# Patient Record
Sex: Female | Born: 1955 | Race: White | Hispanic: No | Marital: Married | Smoking: Never smoker
Health system: Southern US, Community
[De-identification: ages and names within clinical notes are randomized; demographics above are authoritative.]

## PROBLEM LIST (undated history)

## (undated) DIAGNOSIS — F32A Depression, unspecified: Secondary | ICD-10-CM

## (undated) DIAGNOSIS — I82409 Acute embolism and thrombosis of unspecified deep veins of unspecified lower extremity: Secondary | ICD-10-CM

## (undated) DIAGNOSIS — I890 Lymphedema, not elsewhere classified: Secondary | ICD-10-CM

## (undated) DIAGNOSIS — I10 Essential (primary) hypertension: Secondary | ICD-10-CM

## (undated) DIAGNOSIS — M1990 Unspecified osteoarthritis, unspecified site: Secondary | ICD-10-CM

## (undated) DIAGNOSIS — E669 Obesity, unspecified: Secondary | ICD-10-CM

## (undated) HISTORY — DX: Obesity, unspecified: E66.9

## (undated) HISTORY — PX: BRAIN SURGERY: SHX531

## (undated) HISTORY — DX: Acute embolism and thrombosis of unspecified deep veins of unspecified lower extremity: I82.409

## (undated) HISTORY — DX: Essential (primary) hypertension: I10

## (undated) HISTORY — PX: BUNIONECTOMY: SHX129

---
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
## 1986-09-21 HISTORY — PX: DILATION AND CURETTAGE OF UTERUS: SHX78

## 1986-09-21 HISTORY — PX: OTHER SURGICAL HISTORY: SHX169

## 1998-10-24 ENCOUNTER — Ambulatory Visit (HOSPITAL_COMMUNITY): Admission: RE | Admit: 1998-10-24 | Discharge: 1998-10-24 | Payer: Self-pay | Admitting: Internal Medicine

## 1998-10-24 ENCOUNTER — Encounter: Payer: Self-pay | Admitting: Internal Medicine

## 1999-10-20 ENCOUNTER — Other Ambulatory Visit: Admission: RE | Admit: 1999-10-20 | Discharge: 1999-10-20 | Payer: Self-pay | Admitting: Internal Medicine

## 1999-10-22 ENCOUNTER — Encounter: Payer: Self-pay | Admitting: Internal Medicine

## 1999-10-22 ENCOUNTER — Encounter: Admission: RE | Admit: 1999-10-22 | Discharge: 1999-10-22 | Payer: Self-pay | Admitting: Internal Medicine

## 1999-11-05 ENCOUNTER — Encounter: Payer: Self-pay | Admitting: Internal Medicine

## 1999-11-05 ENCOUNTER — Encounter (INDEPENDENT_AMBULATORY_CARE_PROVIDER_SITE_OTHER): Payer: Self-pay | Admitting: Specialist

## 1999-11-05 ENCOUNTER — Ambulatory Visit (HOSPITAL_COMMUNITY): Admission: RE | Admit: 1999-11-05 | Discharge: 1999-11-05 | Payer: Self-pay | Admitting: Internal Medicine

## 1999-11-06 ENCOUNTER — Ambulatory Visit (HOSPITAL_COMMUNITY): Admission: RE | Admit: 1999-11-06 | Discharge: 1999-11-06 | Payer: Self-pay | Admitting: Internal Medicine

## 1999-11-06 ENCOUNTER — Encounter: Payer: Self-pay | Admitting: Internal Medicine

## 2000-01-12 ENCOUNTER — Inpatient Hospital Stay (HOSPITAL_COMMUNITY): Admission: EM | Admit: 2000-01-12 | Discharge: 2000-01-13 | Payer: Self-pay | Admitting: Emergency Medicine

## 2000-01-12 ENCOUNTER — Encounter: Payer: Self-pay | Admitting: Emergency Medicine

## 2000-01-13 ENCOUNTER — Encounter: Payer: Self-pay | Admitting: Interventional Cardiology

## 2000-11-01 ENCOUNTER — Other Ambulatory Visit: Admission: RE | Admit: 2000-11-01 | Discharge: 2000-11-01 | Payer: Self-pay | Admitting: Internal Medicine

## 2000-11-17 ENCOUNTER — Ambulatory Visit (HOSPITAL_COMMUNITY): Admission: RE | Admit: 2000-11-17 | Discharge: 2000-11-17 | Payer: Self-pay | Admitting: Internal Medicine

## 2000-11-17 ENCOUNTER — Encounter: Payer: Self-pay | Admitting: Internal Medicine

## 2001-11-23 ENCOUNTER — Encounter: Payer: Self-pay | Admitting: Internal Medicine

## 2001-11-23 ENCOUNTER — Ambulatory Visit (HOSPITAL_COMMUNITY): Admission: RE | Admit: 2001-11-23 | Discharge: 2001-11-23 | Payer: Self-pay | Admitting: Internal Medicine

## 2001-12-01 ENCOUNTER — Other Ambulatory Visit: Admission: RE | Admit: 2001-12-01 | Discharge: 2001-12-01 | Payer: Self-pay | Admitting: Internal Medicine

## 2001-12-20 ENCOUNTER — Encounter: Payer: Self-pay | Admitting: Internal Medicine

## 2001-12-20 ENCOUNTER — Encounter: Admission: RE | Admit: 2001-12-20 | Discharge: 2001-12-20 | Payer: Self-pay | Admitting: Internal Medicine

## 2002-02-09 ENCOUNTER — Observation Stay (HOSPITAL_COMMUNITY): Admission: RE | Admit: 2002-02-09 | Discharge: 2002-02-10 | Payer: Self-pay | Admitting: Surgery

## 2002-02-09 ENCOUNTER — Encounter (INDEPENDENT_AMBULATORY_CARE_PROVIDER_SITE_OTHER): Payer: Self-pay | Admitting: Specialist

## 2002-02-09 ENCOUNTER — Encounter: Payer: Self-pay | Admitting: Surgery

## 2002-09-21 HISTORY — PX: GALLBLADDER SURGERY: SHX652

## 2002-11-06 ENCOUNTER — Encounter: Payer: Self-pay | Admitting: Emergency Medicine

## 2002-11-06 ENCOUNTER — Emergency Department (HOSPITAL_COMMUNITY): Admission: EM | Admit: 2002-11-06 | Discharge: 2002-11-06 | Payer: Self-pay

## 2003-01-29 ENCOUNTER — Ambulatory Visit (HOSPITAL_COMMUNITY): Admission: RE | Admit: 2003-01-29 | Discharge: 2003-01-29 | Payer: Self-pay | Admitting: Internal Medicine

## 2003-01-29 ENCOUNTER — Encounter: Payer: Self-pay | Admitting: Internal Medicine

## 2003-07-02 ENCOUNTER — Other Ambulatory Visit: Admission: RE | Admit: 2003-07-02 | Discharge: 2003-07-02 | Payer: Self-pay | Admitting: Internal Medicine

## 2004-02-11 ENCOUNTER — Ambulatory Visit (HOSPITAL_COMMUNITY): Admission: RE | Admit: 2004-02-11 | Discharge: 2004-02-11 | Payer: Self-pay | Admitting: Internal Medicine

## 2005-02-10 ENCOUNTER — Other Ambulatory Visit: Admission: RE | Admit: 2005-02-10 | Discharge: 2005-02-10 | Payer: Self-pay | Admitting: Internal Medicine

## 2005-02-13 ENCOUNTER — Encounter: Admission: RE | Admit: 2005-02-13 | Discharge: 2005-02-13 | Payer: Self-pay | Admitting: Internal Medicine

## 2005-03-16 ENCOUNTER — Encounter (HOSPITAL_COMMUNITY): Admission: RE | Admit: 2005-03-16 | Discharge: 2005-06-14 | Payer: Self-pay | Admitting: Internal Medicine

## 2005-03-25 ENCOUNTER — Other Ambulatory Visit: Admission: RE | Admit: 2005-03-25 | Discharge: 2005-03-25 | Payer: Self-pay | Admitting: Interventional Radiology

## 2005-03-25 ENCOUNTER — Encounter (INDEPENDENT_AMBULATORY_CARE_PROVIDER_SITE_OTHER): Payer: Self-pay | Admitting: Specialist

## 2005-03-25 ENCOUNTER — Encounter: Admission: RE | Admit: 2005-03-25 | Discharge: 2005-03-25 | Payer: Self-pay | Admitting: Internal Medicine

## 2005-04-08 ENCOUNTER — Ambulatory Visit (HOSPITAL_COMMUNITY): Admission: RE | Admit: 2005-04-08 | Discharge: 2005-04-08 | Payer: Self-pay | Admitting: Internal Medicine

## 2006-02-11 ENCOUNTER — Encounter: Admission: RE | Admit: 2006-02-11 | Discharge: 2006-02-11 | Payer: Self-pay | Admitting: Surgery

## 2006-04-16 ENCOUNTER — Ambulatory Visit (HOSPITAL_COMMUNITY): Admission: RE | Admit: 2006-04-16 | Discharge: 2006-04-16 | Payer: Self-pay | Admitting: Internal Medicine

## 2006-06-01 ENCOUNTER — Ambulatory Visit (HOSPITAL_COMMUNITY): Admission: RE | Admit: 2006-06-01 | Discharge: 2006-06-01 | Payer: Self-pay | Admitting: Gastroenterology

## 2006-06-23 ENCOUNTER — Other Ambulatory Visit: Admission: RE | Admit: 2006-06-23 | Discharge: 2006-06-23 | Payer: Self-pay | Admitting: Internal Medicine

## 2007-08-30 ENCOUNTER — Ambulatory Visit (HOSPITAL_COMMUNITY): Admission: RE | Admit: 2007-08-30 | Discharge: 2007-08-30 | Payer: Self-pay | Admitting: Internal Medicine

## 2008-10-08 ENCOUNTER — Ambulatory Visit (HOSPITAL_COMMUNITY): Admission: RE | Admit: 2008-10-08 | Discharge: 2008-10-08 | Payer: Self-pay | Admitting: Obstetrics

## 2009-12-17 ENCOUNTER — Ambulatory Visit (HOSPITAL_COMMUNITY): Admission: RE | Admit: 2009-12-17 | Discharge: 2009-12-17 | Payer: Self-pay | Admitting: Obstetrics

## 2010-07-28 ENCOUNTER — Encounter: Admission: RE | Admit: 2010-07-28 | Discharge: 2010-07-28 | Payer: Self-pay | Admitting: Ophthalmology

## 2010-08-13 ENCOUNTER — Ambulatory Visit (HOSPITAL_COMMUNITY): Admission: RE | Admit: 2010-08-13 | Discharge: 2010-08-13 | Payer: Self-pay | Admitting: Neurology

## 2010-08-13 ENCOUNTER — Ambulatory Visit: Payer: Self-pay | Admitting: Cardiology

## 2010-08-13 ENCOUNTER — Encounter: Admission: RE | Admit: 2010-08-13 | Discharge: 2010-08-13 | Payer: Self-pay | Admitting: Neurology

## 2010-08-13 ENCOUNTER — Encounter: Payer: Self-pay | Admitting: Cardiovascular Disease

## 2010-08-13 ENCOUNTER — Encounter (INDEPENDENT_AMBULATORY_CARE_PROVIDER_SITE_OTHER): Payer: Self-pay | Admitting: Neurology

## 2010-08-13 ENCOUNTER — Ambulatory Visit: Payer: Self-pay

## 2010-08-18 ENCOUNTER — Telehealth (INDEPENDENT_AMBULATORY_CARE_PROVIDER_SITE_OTHER): Payer: Self-pay | Admitting: *Deleted

## 2010-10-23 ENCOUNTER — Ambulatory Visit: Payer: 59 | Attending: Neurosurgery | Admitting: Rehabilitative and Restorative Service Providers"

## 2010-10-23 DIAGNOSIS — IMO0001 Reserved for inherently not codable concepts without codable children: Secondary | ICD-10-CM | POA: Insufficient documentation

## 2010-10-23 DIAGNOSIS — R269 Unspecified abnormalities of gait and mobility: Secondary | ICD-10-CM | POA: Insufficient documentation

## 2010-10-23 NOTE — Progress Notes (Signed)
  pt signed ROI Echo faxed over to [facility] @ [phone] [facility]  August 18, 2010 10:37 AM

## 2010-10-28 ENCOUNTER — Encounter: Payer: Self-pay | Admitting: Rehabilitative and Restorative Service Providers"

## 2010-10-31 ENCOUNTER — Ambulatory Visit: Payer: 59 | Admitting: Rehabilitative and Restorative Service Providers"

## 2010-11-04 ENCOUNTER — Encounter: Payer: Self-pay | Admitting: Rehabilitative and Restorative Service Providers"

## 2010-11-07 ENCOUNTER — Encounter: Payer: Self-pay | Admitting: Rehabilitative and Restorative Service Providers"

## 2010-11-07 ENCOUNTER — Ambulatory Visit: Payer: 59 | Admitting: Rehabilitative and Restorative Service Providers"

## 2010-11-11 ENCOUNTER — Ambulatory Visit: Payer: 59 | Admitting: Rehabilitative and Restorative Service Providers"

## 2010-11-13 ENCOUNTER — Other Ambulatory Visit: Payer: Self-pay | Admitting: Neurosurgery

## 2010-11-13 DIAGNOSIS — D333 Benign neoplasm of cranial nerves: Secondary | ICD-10-CM

## 2010-11-14 ENCOUNTER — Ambulatory Visit: Payer: 59 | Admitting: Physical Therapy

## 2010-11-15 ENCOUNTER — Inpatient Hospital Stay (INDEPENDENT_AMBULATORY_CARE_PROVIDER_SITE_OTHER)
Admission: RE | Admit: 2010-11-15 | Discharge: 2010-11-15 | Disposition: A | Discharge status: Home / Self Care | Payer: 59 | Source: Ambulatory Visit | Attending: Family Medicine | Admitting: Family Medicine

## 2010-11-15 DIAGNOSIS — Z4802 Encounter for removal of sutures: Secondary | ICD-10-CM

## 2010-11-17 ENCOUNTER — Other Ambulatory Visit: Payer: 59

## 2010-11-18 ENCOUNTER — Ambulatory Visit: Payer: 59 | Admitting: Rehabilitative and Restorative Service Providers"

## 2010-11-19 ENCOUNTER — Encounter: Payer: 59 | Admitting: Rehabilitative and Restorative Service Providers"

## 2010-11-20 ENCOUNTER — Ambulatory Visit (HOSPITAL_COMMUNITY)
Admission: RE | Admit: 2010-11-20 | Discharge: 2010-11-20 | Disposition: A | Discharge status: Home / Self Care | Payer: 59 | Source: Ambulatory Visit | Attending: Neurosurgery | Admitting: Neurosurgery

## 2010-11-20 DIAGNOSIS — D333 Benign neoplasm of cranial nerves: Secondary | ICD-10-CM | POA: Insufficient documentation

## 2010-11-20 DIAGNOSIS — Z483 Aftercare following surgery for neoplasm: Secondary | ICD-10-CM | POA: Insufficient documentation

## 2010-11-20 MED ORDER — GADOBENATE DIMEGLUMINE 529 MG/ML IV SOLN
20.0000 mL | Freq: Once | INTRAVENOUS | Status: AC | PRN
  Administered 2010-11-20: 20 mL via INTRAVENOUS

## 2010-11-21 ENCOUNTER — Ambulatory Visit: Payer: 59 | Attending: Neurosurgery | Admitting: Physical Therapy

## 2010-11-21 DIAGNOSIS — IMO0001 Reserved for inherently not codable concepts without codable children: Secondary | ICD-10-CM | POA: Insufficient documentation

## 2010-11-21 DIAGNOSIS — R269 Unspecified abnormalities of gait and mobility: Secondary | ICD-10-CM | POA: Insufficient documentation

## 2010-11-26 ENCOUNTER — Ambulatory Visit: Payer: 59 | Admitting: Rehabilitative and Restorative Service Providers"

## 2010-11-26 ENCOUNTER — Ambulatory Visit: Payer: 59 | Admitting: Physical Therapy

## 2010-11-28 ENCOUNTER — Ambulatory Visit: Payer: 59 | Admitting: Rehabilitative and Restorative Service Providers"

## 2010-12-02 ENCOUNTER — Ambulatory Visit: Payer: 59 | Admitting: Rehabilitative and Restorative Service Providers"

## 2010-12-04 ENCOUNTER — Ambulatory Visit: Payer: 59 | Admitting: Rehabilitative and Restorative Service Providers"

## 2010-12-09 ENCOUNTER — Ambulatory Visit: Payer: 59 | Admitting: Rehabilitative and Restorative Service Providers"

## 2010-12-16 ENCOUNTER — Ambulatory Visit: Payer: 59 | Admitting: Rehabilitative and Restorative Service Providers"

## 2010-12-22 ENCOUNTER — Encounter: Payer: 59 | Admitting: Rehabilitative and Restorative Service Providers"

## 2010-12-29 ENCOUNTER — Encounter: Payer: 59 | Admitting: Rehabilitative and Restorative Service Providers"

## 2011-01-01 ENCOUNTER — Other Ambulatory Visit: Payer: Self-pay | Admitting: Surgery

## 2011-01-01 DIAGNOSIS — E049 Nontoxic goiter, unspecified: Secondary | ICD-10-CM

## 2011-01-12 ENCOUNTER — Ambulatory Visit
Admission: RE | Admit: 2011-01-12 | Discharge: 2011-01-12 | Disposition: A | Discharge status: Home / Self Care | Payer: 59 | Source: Ambulatory Visit | Attending: Surgery | Admitting: Surgery

## 2011-01-12 DIAGNOSIS — E049 Nontoxic goiter, unspecified: Secondary | ICD-10-CM

## 2011-01-13 ENCOUNTER — Encounter: Payer: 59 | Admitting: Rehabilitative and Restorative Service Providers"

## 2011-01-23 ENCOUNTER — Encounter: Payer: 59 | Admitting: Rehabilitative and Restorative Service Providers"

## 2011-02-06 ENCOUNTER — Encounter: Payer: 59 | Admitting: Rehabilitative and Restorative Service Providers"

## 2011-02-06 NOTE — Op Note (Signed)
[place]  Patient:    Melissa Morrow, Melissa Morrow Visit Number: [id number] MRN: [id number]          Service Type: OBV Location: 4W 0451 01 Attending Physician:  Andre Lefort Dictated by:   Sandria Bales. Ezzard Standing, M.D. Proc. Date: 02/09/02 Admit Date:  02/09/2002 Discharge Date: 02/10/2002   CC:         Julieanne Manson, M.D.   Operative Report  DATE OF BIRTH:  02-13-1956. CCS #61500.  PREOPERATIVE DIAGNOSES:  Chronic cholecystectomy, cholelithiasis.  POSTOPERATIVE DIAGNOSES:  Chronic cholecystitis, cholelithiasis.  PROCEDURE:  Laparoscopic cholecystectomy with intraoperative cholangiogram.  SURGEON:  Sandria Bales. Ezzard Standing, M.D.  FIRST ASSISTANT:  Jimmye Norman, M.D.  ANESTHESIA:  General endotracheal.  ESTIMATED BLOOD LOSS:  Minimal.  INDICATION FOR PROCEDURE:  Melissa Morrow is a 55 year old white female who has had symptomatic cholelithiasis, had an ultrasound that shows the gallbladder packed with stones and the common bile duct at the upper limits of normal. She now comes for attempted laparoscopic cholecystectomy.  DESCRIPTION OF PROCEDURE:  The patient was placed in a supine position, given a general endotracheal anesthetic supervised by Lucille Passy, M.D.  She had 1 g of Ancef at the initiation of the procedure.  She had her abdomen prepped with Betadine solution and sterilely draped.  An infraumbilical incision was made with sharp dissection and carried down into the abdominal cavity.  A 0 degree laparoscope was inserted through a 12 mm Hasson trocar. The Hasson trocar was secured with a 0 Vicryl suture.  Laparoscopic exploration revealed the right and left lobes of the liver were unremarkable. The stomach was unremarkable.  I could see part of the right colon and transverse colon that was unremarkable.  The remainder of the bowel was covered with omentum and looking down at her pelvis I did not try to visualize her uterus or ovaries,  but there was no mass or nodule coming out of the pelvis.  I then placed three additional trocars, a 12 mm Ethicon trocar in a subxiphoid location, a 5 mm Ethicon trocar right midsubcostal, and a 5 mm Ethibond trocar in the right lateral subcostal location.  The gallbladder was identified and grasped and rotated cephalad.  The gallbladder was actually in a fair amount of mesentery but the liver, which was somewhat fatty, tended to collapse around the cystic duct-gallbladder junction.  She had a thickened, white-looking gallbladder consistent with longstanding chronic cholecystitis. I dissected out and identified a posterior cystic artery, which was doubly endoclipped, an anterior cystic artery, which was doubly endoclipped.  I then identified the gallbladder-cystic duct junction.  There was a lot of scarring between the gallbladder, the cystic duct, and toward the common bile duct, but I thought I opened the triangle of Calot well and visualized where the gallbladder ended into the cystic duct.  I then placed a clip on the gallbladder side of the cystic duct.  I then tried to do an intraoperative cholangiogram using a cut-off Taut catheter.  The intraoperative cholangiogram was shot with a 14-gauge Jelco placed into the abdominal wall and a Taut catheter inserted through this.  However, upon inserting this into the cystic duct, I was unable to press any saline, and the patient had a clear impacted cystic duct stone, and I actually milked out two or three stones before putting in the Taut.  Then I was able to crush and irrigate out the remaining stone.  I did use a Reddick catheter to try  to use this much as you use a Fogarty catheter, but this really was not successful. Most successful was trying to crush the stone and then irrigate it with saline, where I got actually clear saline coming out and then bile refluxing back.  I then placed the Taut catheter into the duct and shot an  intraoperative cholangiogram using half-strength Hypaque solution.  It showed free flow of contrast down the common bile duct into the duodenum and refluxing back into the hepatic radicles.  There was no evidence of any obstruction or mass within the common bile duct.  This was felt to be a normal intraoperative cholangiogram.  She actually had a fairly long segment of cystic duct that was hard to tell from my dissection because she had some scarring right at her common bile duct and cystic duct.  I then triply clipped the cystic duct and divided it after the Taut catheter had been removed.  I then dissected the gallbladder from the gallbladder bed using primarily hook Bovie coagulation.  Prior to complete division of the gallbladder from the gallbladder bed, I revisualized the triangle of Calot and the gallbladder bed. There was no bleeding and no bile leak, and I divided the gallbladder and placed it in an Endocatch bag.  It was delivered through the umbilicus intact.  There were a few stone fragments from what I had milked back to the cystic duct I was able to irrigate and capture either with the pooper scooper or by suctioning these fragments out.  I then irrigated the abdomen out with a liter of saline.  The umbilical trocar was removed and was sewn with a 0 Vicryl suture.  The other trocars were removed under direct visualization.  There was no bleeding at those trocar sites.  The skin at each site was closed with a 5-0 Vicryl suture, painted with tincture of benzoin and steri-stripped and then sterilely dressed.  The patient tolerated the procedure well, was transported to the recovery room in good condition. Dictated by:   Sandria Bales. Ezzard Standing, M.D. Attending Physician:  Andre Lefort DD:  02/09/02 TD:  02/11/02 Job: [id number]

## 2011-02-06 NOTE — Op Note (Signed)
NAME:  Melissa Morrow, Melissa Morrow NO.:  [id number]   MEDICAL RECORD NO.:  [id number]          PATIENT TYPE:  AMB   LOCATION:  ENDO                         FACILITY:  [facility]   PHYSICIAN:  Bernette Redbird, M.D.   DATE OF BIRTH:  Aug 07, 1956   DATE OF PROCEDURE:  06/01/2006  DATE OF DISCHARGE:                                 OPERATIVE REPORT   PROCEDURE:  Colonoscopy.   INDICATIONS:  55 year old female for initial colon cancer screening exam.  No worrisome risk factors or symptoms.  She might see occasional minimal  rectal bleeding attributed to hemorrhoids.   FINDINGS:  Moderate sigmoid diverticulosis.   PROCEDURE:  The nature, purpose, and risks of the procedure have been  discussed with the patient through our open access program.  I reviewed the  risks and purpose of the procedure with her immediately prior to the exam  and she provided written consent.   Sedation for this procedure totaled fentanyl 75 mcg and Versed 7 mg IV  without arrhythmias or desaturation.  The Olympus adult adjustable tension  video colonoscope was advanced quite easily around the colon through a  slightly fixated sigmoid region to the terminal ileum which had normal  appearance, and pullback was then performed.  The quality of prep was  excellent and it was felt that all areas were well seen.   The only abnormality on this exam was some moderate sigmoid diverticulosis  with associated muscular thickening and slight fixation of the sigmoid  region.  No polyps, cancer, colitis, vascular malformations or diverticular  disease other than in the sigmoid region was observed.  Retroflexion in the  rectum and reinspection of the rectum were unremarkable.  No biopsies were  obtained.  The patient tolerated the procedure well and there no apparent  complications.   IMPRESSION:  1. Screening colonoscopy in a standard risk individual, without      worrisome findings (V76.51).  2. Moderate sigmoid  diverticulosis.   PLAN:  Repeat colonoscopy in ten years or sooner if desired by the patient.           ______________________________  Bernette Redbird, M.D.     RB/MEDQ  D:  06/01/2006  T:  06/01/2006  Job:  [id number]   cc:   [facility]

## 2011-03-11 ENCOUNTER — Encounter (INDEPENDENT_AMBULATORY_CARE_PROVIDER_SITE_OTHER): Payer: Self-pay | Admitting: Surgery

## 2011-03-11 DIAGNOSIS — I10 Essential (primary) hypertension: Secondary | ICD-10-CM | POA: Insufficient documentation

## 2011-03-11 DIAGNOSIS — Z803 Family history of malignant neoplasm of breast: Secondary | ICD-10-CM

## 2011-03-11 DIAGNOSIS — Z460 Encounter for fitting and adjustment of spectacles and contact lenses: Secondary | ICD-10-CM | POA: Insufficient documentation

## 2011-03-11 DIAGNOSIS — H9190 Unspecified hearing loss, unspecified ear: Secondary | ICD-10-CM | POA: Insufficient documentation

## 2011-03-11 DIAGNOSIS — Z780 Asymptomatic menopausal state: Secondary | ICD-10-CM | POA: Insufficient documentation

## 2011-04-03 ENCOUNTER — Other Ambulatory Visit (HOSPITAL_COMMUNITY): Payer: Self-pay | Admitting: Obstetrics

## 2011-04-03 DIAGNOSIS — Z1231 Encounter for screening mammogram for malignant neoplasm of breast: Secondary | ICD-10-CM

## 2011-04-15 ENCOUNTER — Ambulatory Visit (HOSPITAL_COMMUNITY)
Admission: RE | Admit: 2011-04-15 | Discharge: 2011-04-15 | Disposition: A | Discharge status: Home / Self Care | Payer: 59 | Source: Ambulatory Visit | Attending: Obstetrics | Admitting: Obstetrics

## 2011-04-15 DIAGNOSIS — Z1231 Encounter for screening mammogram for malignant neoplasm of breast: Secondary | ICD-10-CM

## 2011-05-26 ENCOUNTER — Other Ambulatory Visit: Payer: Self-pay | Admitting: Dermatology

## 2011-06-10 ENCOUNTER — Other Ambulatory Visit (HOSPITAL_COMMUNITY): Payer: Self-pay | Admitting: Otolaryngology

## 2011-06-10 DIAGNOSIS — D333 Benign neoplasm of cranial nerves: Secondary | ICD-10-CM

## 2011-06-12 ENCOUNTER — Ambulatory Visit (HOSPITAL_COMMUNITY)
Admission: RE | Admit: 2011-06-12 | Discharge: 2011-06-12 | Disposition: A | Discharge status: Home / Self Care | Payer: 59 | Source: Ambulatory Visit | Attending: Radiology | Admitting: Radiology

## 2011-06-12 DIAGNOSIS — R51 Headache: Secondary | ICD-10-CM | POA: Insufficient documentation

## 2011-06-12 LAB — CREATININE, SERUM
Creatinine, Ser: 0.61 mg/dL (ref 0.50–1.10)
GFR calc Af Amer: >60 mL/min (ref >60)

## 2011-06-15 ENCOUNTER — Ambulatory Visit (HOSPITAL_COMMUNITY)
Admission: RE | Admit: 2011-06-15 | Discharge: 2011-06-15 | Disposition: A | Discharge status: Home / Self Care | Payer: 59 | Source: Ambulatory Visit | Attending: Otolaryngology | Admitting: Otolaryngology

## 2011-06-15 DIAGNOSIS — D333 Benign neoplasm of cranial nerves: Secondary | ICD-10-CM | POA: Insufficient documentation

## 2011-06-15 MED ORDER — GADOBENATE DIMEGLUMINE 529 MG/ML IV SOLN
20.0000 mL | Freq: Once | INTRAVENOUS | Status: AC | PRN
  Administered 2011-06-15: 20 mL via INTRAVENOUS

## 2011-07-08 ENCOUNTER — Telehealth: Payer: Self-pay

## 2011-07-08 NOTE — Telephone Encounter (Signed)
Echo faxed to [facility] @ [facility] @ [phone]  07/08/11/km

## 2011-08-26 ENCOUNTER — Ambulatory Visit: Payer: 59 | Admitting: Rehabilitative and Restorative Service Providers"

## 2011-10-01 ENCOUNTER — Ambulatory Visit: Payer: 59 | Attending: Otolaryngology | Admitting: Rehabilitative and Restorative Service Providers"

## 2011-10-01 DIAGNOSIS — IMO0001 Reserved for inherently not codable concepts without codable children: Secondary | ICD-10-CM | POA: Insufficient documentation

## 2011-10-01 DIAGNOSIS — R269 Unspecified abnormalities of gait and mobility: Secondary | ICD-10-CM | POA: Insufficient documentation

## 2011-10-05 ENCOUNTER — Ambulatory Visit: Payer: 59 | Admitting: Rehabilitative and Restorative Service Providers"

## 2011-10-09 ENCOUNTER — Encounter: Payer: 59 | Admitting: Rehabilitative and Restorative Service Providers"

## 2011-10-13 ENCOUNTER — Ambulatory Visit: Payer: 59 | Admitting: Rehabilitative and Restorative Service Providers"

## 2011-10-16 ENCOUNTER — Ambulatory Visit: Payer: 59 | Admitting: Physical Therapy

## 2011-10-23 ENCOUNTER — Ambulatory Visit: Payer: 59 | Attending: Otolaryngology | Admitting: Rehabilitative and Restorative Service Providers"

## 2011-10-23 DIAGNOSIS — R269 Unspecified abnormalities of gait and mobility: Secondary | ICD-10-CM | POA: Insufficient documentation

## 2011-10-23 DIAGNOSIS — IMO0001 Reserved for inherently not codable concepts without codable children: Secondary | ICD-10-CM | POA: Insufficient documentation

## 2011-10-26 ENCOUNTER — Encounter: Payer: 59 | Admitting: Rehabilitative and Restorative Service Providers"

## 2011-11-03 ENCOUNTER — Encounter: Payer: 59 | Admitting: Rehabilitative and Restorative Service Providers"

## 2011-11-09 ENCOUNTER — Ambulatory Visit: Payer: 59 | Admitting: Rehabilitative and Restorative Service Providers"

## 2011-11-23 ENCOUNTER — Encounter: Payer: 59 | Admitting: Rehabilitative and Restorative Service Providers"

## 2011-12-07 ENCOUNTER — Encounter: Payer: 59 | Admitting: Rehabilitative and Restorative Service Providers"

## 2012-03-01 ENCOUNTER — Other Ambulatory Visit: Payer: Self-pay | Admitting: Dermatology

## 2012-03-03 ENCOUNTER — Other Ambulatory Visit: Payer: Self-pay | Admitting: Dermatology

## 2012-05-19 ENCOUNTER — Other Ambulatory Visit (HOSPITAL_COMMUNITY): Payer: Self-pay | Admitting: Obstetrics

## 2012-05-19 DIAGNOSIS — Z1231 Encounter for screening mammogram for malignant neoplasm of breast: Secondary | ICD-10-CM

## 2012-06-06 ENCOUNTER — Ambulatory Visit (HOSPITAL_COMMUNITY): Payer: 59

## 2012-06-10 ENCOUNTER — Ambulatory Visit (HOSPITAL_COMMUNITY): Payer: 59 | Attending: Obstetrics

## 2012-08-10 ENCOUNTER — Other Ambulatory Visit (HOSPITAL_COMMUNITY): Payer: Self-pay | Admitting: Otolaryngology

## 2012-08-10 DIAGNOSIS — D333 Benign neoplasm of cranial nerves: Secondary | ICD-10-CM

## 2012-08-22 ENCOUNTER — Ambulatory Visit (HOSPITAL_COMMUNITY)
Admission: RE | Admit: 2012-08-22 | Discharge: 2012-08-22 | Disposition: A | Discharge status: Home / Self Care | Payer: 59 | Source: Ambulatory Visit | Attending: Otolaryngology | Admitting: Otolaryngology

## 2012-08-22 DIAGNOSIS — D333 Benign neoplasm of cranial nerves: Secondary | ICD-10-CM | POA: Insufficient documentation

## 2012-08-22 MED ORDER — GADOBENATE DIMEGLUMINE 529 MG/ML IV SOLN
20.0000 mL | Freq: Once | INTRAVENOUS | Status: AC | PRN
  Administered 2012-08-22: 20 mL via INTRAVENOUS

## 2012-08-23 LAB — POCT I-STAT, CHEM 8
BUN: 13 mg/dL (ref 6–23)
Calcium, Ion: 1.22 mmol/L (ref 1.12–1.23)
Creatinine, Ser: 1 mg/dL (ref 0.50–1.10)
TCO2: 27 mmol/L (ref 0–100)

## 2012-10-18 ENCOUNTER — Ambulatory Visit (HOSPITAL_COMMUNITY): Payer: 59

## 2012-10-21 ENCOUNTER — Ambulatory Visit (HOSPITAL_COMMUNITY): Payer: 59

## 2012-10-24 ENCOUNTER — Ambulatory Visit (HOSPITAL_COMMUNITY)
Admission: RE | Admit: 2012-10-24 | Discharge: 2012-10-24 | Disposition: A | Discharge status: Home / Self Care | Payer: 59 | Source: Ambulatory Visit | Attending: Obstetrics | Admitting: Obstetrics

## 2012-10-24 DIAGNOSIS — Z1231 Encounter for screening mammogram for malignant neoplasm of breast: Secondary | ICD-10-CM | POA: Insufficient documentation

## 2012-11-24 ENCOUNTER — Other Ambulatory Visit: Payer: Self-pay | Admitting: Dermatology

## 2013-07-03 ENCOUNTER — Ambulatory Visit: Payer: 59 | Admitting: Family Medicine

## 2013-07-11 ENCOUNTER — Ambulatory Visit (INDEPENDENT_AMBULATORY_CARE_PROVIDER_SITE_OTHER): Payer: Self-pay | Admitting: Family Medicine

## 2013-07-11 DIAGNOSIS — Z713 Dietary counseling and surveillance: Secondary | ICD-10-CM

## 2013-10-27 ENCOUNTER — Other Ambulatory Visit (HOSPITAL_COMMUNITY): Payer: Self-pay | Admitting: Obstetrics

## 2013-10-27 DIAGNOSIS — Z1231 Encounter for screening mammogram for malignant neoplasm of breast: Secondary | ICD-10-CM

## 2013-11-03 ENCOUNTER — Ambulatory Visit (HOSPITAL_COMMUNITY): Payer: 59

## 2013-11-20 ENCOUNTER — Ambulatory Visit (HOSPITAL_COMMUNITY)
Admission: RE | Admit: 2013-11-20 | Discharge: 2013-11-20 | Disposition: A | Discharge status: Home / Self Care | Payer: 59 | Source: Ambulatory Visit | Attending: Obstetrics | Admitting: Obstetrics

## 2013-11-20 DIAGNOSIS — Z1231 Encounter for screening mammogram for malignant neoplasm of breast: Secondary | ICD-10-CM | POA: Insufficient documentation

## 2014-10-01 ENCOUNTER — Other Ambulatory Visit: Payer: Self-pay | Admitting: Neurosurgery

## 2014-10-01 ENCOUNTER — Other Ambulatory Visit (HOSPITAL_COMMUNITY): Payer: Self-pay | Admitting: Neurosurgery

## 2014-10-01 DIAGNOSIS — D333 Benign neoplasm of cranial nerves: Secondary | ICD-10-CM

## 2014-10-17 ENCOUNTER — Ambulatory Visit (HOSPITAL_COMMUNITY)
Admission: RE | Admit: 2014-10-17 | Discharge: 2014-10-17 | Disposition: A | Discharge status: Home / Self Care | Payer: 59 | Source: Ambulatory Visit | Attending: Neurosurgery | Admitting: Neurosurgery

## 2014-10-17 DIAGNOSIS — D333 Benign neoplasm of cranial nerves: Secondary | ICD-10-CM | POA: Insufficient documentation

## 2014-10-17 DIAGNOSIS — Z01818 Encounter for other preprocedural examination: Secondary | ICD-10-CM | POA: Diagnosis not present

## 2014-10-17 MED ORDER — GADOBENATE DIMEGLUMINE 529 MG/ML IV SOLN
20.0000 mL | Freq: Once | INTRAVENOUS | Status: AC | PRN
  Administered 2014-10-17: 20 mL via INTRAVENOUS

## 2014-12-26 ENCOUNTER — Other Ambulatory Visit (HOSPITAL_COMMUNITY): Payer: Self-pay | Admitting: Obstetrics

## 2014-12-26 DIAGNOSIS — Z1231 Encounter for screening mammogram for malignant neoplasm of breast: Secondary | ICD-10-CM

## 2015-01-17 ENCOUNTER — Ambulatory Visit (HOSPITAL_COMMUNITY): Payer: 59

## 2015-01-18 ENCOUNTER — Ambulatory Visit (HOSPITAL_COMMUNITY)
Admission: RE | Admit: 2015-01-18 | Discharge: 2015-01-18 | Disposition: A | Discharge status: Home / Self Care | Payer: 59 | Source: Ambulatory Visit | Attending: Obstetrics | Admitting: Obstetrics

## 2015-01-18 DIAGNOSIS — Z1231 Encounter for screening mammogram for malignant neoplasm of breast: Secondary | ICD-10-CM | POA: Diagnosis not present

## 2015-11-29 DIAGNOSIS — Z1211 Encounter for screening for malignant neoplasm of colon: Secondary | ICD-10-CM | POA: Diagnosis not present

## 2015-11-29 DIAGNOSIS — Z86018 Personal history of other benign neoplasm: Secondary | ICD-10-CM | POA: Diagnosis not present

## 2015-11-29 DIAGNOSIS — E042 Nontoxic multinodular goiter: Secondary | ICD-10-CM | POA: Diagnosis not present

## 2015-11-29 DIAGNOSIS — Z0000 Encounter for general adult medical examination without abnormal findings: Secondary | ICD-10-CM | POA: Diagnosis not present

## 2015-11-29 DIAGNOSIS — I10 Essential (primary) hypertension: Secondary | ICD-10-CM | POA: Diagnosis not present

## 2015-12-04 DIAGNOSIS — G4733 Obstructive sleep apnea (adult) (pediatric): Secondary | ICD-10-CM | POA: Diagnosis not present

## 2015-12-11 DIAGNOSIS — H9042 Sensorineural hearing loss, unilateral, left ear, with unrestricted hearing on the contralateral side: Secondary | ICD-10-CM | POA: Diagnosis not present

## 2015-12-16 MED FILL — LOSARTAN-HCTZ 50-12.5 MG TA: 50-12.5 | 90 days supply | Qty: 90 | Fill #0

## 2016-01-25 DIAGNOSIS — H9072 Mixed conductive and sensorineural hearing loss, unilateral, left ear, with unrestricted hearing on the contralateral side: Secondary | ICD-10-CM | POA: Diagnosis not present

## 2016-03-10 DIAGNOSIS — G4733 Obstructive sleep apnea (adult) (pediatric): Secondary | ICD-10-CM | POA: Diagnosis not present

## 2016-03-11 DIAGNOSIS — R635 Abnormal weight gain: Secondary | ICD-10-CM | POA: Diagnosis not present

## 2016-03-11 DIAGNOSIS — F54 Psychological and behavioral factors associated with disorders or diseases classified elsewhere: Secondary | ICD-10-CM | POA: Diagnosis not present

## 2016-03-11 DIAGNOSIS — F4329 Adjustment disorder with other symptoms: Secondary | ICD-10-CM | POA: Diagnosis not present

## 2016-03-11 DIAGNOSIS — E6609 Other obesity due to excess calories: Secondary | ICD-10-CM | POA: Diagnosis not present

## 2016-03-11 MED FILL — LOSARTAN-HCTZ 50-12.5 MG TA: 50-12.5 | 90 days supply | Qty: 90 | Fill #1

## 2016-03-19 DIAGNOSIS — R635 Abnormal weight gain: Secondary | ICD-10-CM | POA: Diagnosis not present

## 2016-03-19 DIAGNOSIS — Z6838 Body mass index (BMI) 38.0-38.9, adult: Secondary | ICD-10-CM | POA: Diagnosis not present

## 2016-03-19 DIAGNOSIS — I10 Essential (primary) hypertension: Secondary | ICD-10-CM | POA: Diagnosis not present

## 2016-03-19 DIAGNOSIS — Z9889 Other specified postprocedural states: Secondary | ICD-10-CM | POA: Diagnosis not present

## 2016-03-19 DIAGNOSIS — Z86018 Personal history of other benign neoplasm: Secondary | ICD-10-CM | POA: Diagnosis not present

## 2016-03-19 DIAGNOSIS — G4730 Sleep apnea, unspecified: Secondary | ICD-10-CM | POA: Diagnosis not present

## 2016-03-19 DIAGNOSIS — E669 Obesity, unspecified: Secondary | ICD-10-CM | POA: Diagnosis not present

## 2016-03-31 DIAGNOSIS — Z86018 Personal history of other benign neoplasm: Secondary | ICD-10-CM | POA: Diagnosis not present

## 2016-03-31 DIAGNOSIS — I10 Essential (primary) hypertension: Secondary | ICD-10-CM | POA: Diagnosis not present

## 2016-03-31 DIAGNOSIS — E669 Obesity, unspecified: Secondary | ICD-10-CM | POA: Diagnosis not present

## 2016-03-31 DIAGNOSIS — G4730 Sleep apnea, unspecified: Secondary | ICD-10-CM | POA: Diagnosis not present

## 2016-03-31 DIAGNOSIS — R635 Abnormal weight gain: Secondary | ICD-10-CM | POA: Diagnosis not present

## 2016-03-31 DIAGNOSIS — Z9889 Other specified postprocedural states: Secondary | ICD-10-CM | POA: Diagnosis not present

## 2016-03-31 DIAGNOSIS — Z6832 Body mass index (BMI) 32.0-32.9, adult: Secondary | ICD-10-CM | POA: Diagnosis not present

## 2016-04-15 DIAGNOSIS — G4733 Obstructive sleep apnea (adult) (pediatric): Secondary | ICD-10-CM | POA: Diagnosis not present

## 2016-05-05 DIAGNOSIS — R635 Abnormal weight gain: Secondary | ICD-10-CM | POA: Diagnosis not present

## 2016-05-05 DIAGNOSIS — Z9989 Dependence on other enabling machines and devices: Secondary | ICD-10-CM | POA: Diagnosis not present

## 2016-05-05 DIAGNOSIS — E669 Obesity, unspecified: Secondary | ICD-10-CM | POA: Diagnosis not present

## 2016-05-05 DIAGNOSIS — Z6837 Body mass index (BMI) 37.0-37.9, adult: Secondary | ICD-10-CM | POA: Diagnosis not present

## 2016-05-05 DIAGNOSIS — G4730 Sleep apnea, unspecified: Secondary | ICD-10-CM | POA: Diagnosis not present

## 2016-05-05 DIAGNOSIS — I10 Essential (primary) hypertension: Secondary | ICD-10-CM | POA: Diagnosis not present

## 2016-05-05 MED FILL — PHENDIMETRAZINE 35 MG TAB: 35 | 30 days supply | Qty: 30 | Fill #0

## 2016-05-18 MED FILL — CLOBETASOL 0.05% GEL: 0.05 | 10 days supply | Qty: 30 | Fill #0

## 2016-06-08 MED FILL — LOSARTAN-HCTZ 50-12.5 MG TA: 50-12.5 | 90 days supply | Qty: 90 | Fill #2

## 2016-06-09 DIAGNOSIS — E6609 Other obesity due to excess calories: Secondary | ICD-10-CM | POA: Diagnosis not present

## 2016-06-09 DIAGNOSIS — Z6836 Body mass index (BMI) 36.0-36.9, adult: Secondary | ICD-10-CM | POA: Diagnosis not present

## 2016-06-09 DIAGNOSIS — R635 Abnormal weight gain: Secondary | ICD-10-CM | POA: Diagnosis not present

## 2016-06-15 ENCOUNTER — Other Ambulatory Visit: Payer: Self-pay | Admitting: Obstetrics

## 2016-06-15 DIAGNOSIS — G4733 Obstructive sleep apnea (adult) (pediatric): Secondary | ICD-10-CM | POA: Diagnosis not present

## 2016-06-15 DIAGNOSIS — Z1231 Encounter for screening mammogram for malignant neoplasm of breast: Secondary | ICD-10-CM

## 2016-06-23 DIAGNOSIS — B372 Candidiasis of skin and nail: Secondary | ICD-10-CM | POA: Diagnosis not present

## 2016-06-23 MED FILL — FLUCONAZOLE 150 MG TABLET: 150 | 1 days supply | Qty: 1 | Fill #0

## 2016-06-23 MED FILL — NYSTATIN 100,000 UNITS/GM O: 100000 | 7 days supply | Qty: 30 | Fill #0

## 2016-06-24 ENCOUNTER — Ambulatory Visit
Admission: RE | Admit: 2016-06-24 | Discharge: 2016-06-24 | Disposition: A | Discharge status: Home / Self Care | Payer: 59 | Source: Ambulatory Visit | Attending: Obstetrics | Admitting: Obstetrics

## 2016-06-24 DIAGNOSIS — Z1231 Encounter for screening mammogram for malignant neoplasm of breast: Secondary | ICD-10-CM | POA: Diagnosis not present

## 2016-07-02 ENCOUNTER — Other Ambulatory Visit: Payer: 59

## 2016-07-02 ENCOUNTER — Other Ambulatory Visit: Payer: Self-pay | Admitting: Internal Medicine

## 2016-07-02 DIAGNOSIS — Z6836 Body mass index (BMI) 36.0-36.9, adult: Secondary | ICD-10-CM | POA: Diagnosis not present

## 2016-07-02 DIAGNOSIS — G4733 Obstructive sleep apnea (adult) (pediatric): Secondary | ICD-10-CM | POA: Diagnosis not present

## 2016-07-02 DIAGNOSIS — E042 Nontoxic multinodular goiter: Secondary | ICD-10-CM | POA: Diagnosis not present

## 2016-07-02 DIAGNOSIS — Z86018 Personal history of other benign neoplasm: Secondary | ICD-10-CM | POA: Diagnosis not present

## 2016-07-02 DIAGNOSIS — E6609 Other obesity due to excess calories: Secondary | ICD-10-CM | POA: Diagnosis not present

## 2016-07-02 DIAGNOSIS — I10 Essential (primary) hypertension: Secondary | ICD-10-CM | POA: Diagnosis not present

## 2016-07-03 MED FILL — GAVILYTE-N SOLUTION: 420 | 2 days supply | Qty: 4000 | Fill #0

## 2016-07-08 DIAGNOSIS — G4733 Obstructive sleep apnea (adult) (pediatric): Secondary | ICD-10-CM | POA: Diagnosis not present

## 2016-07-13 ENCOUNTER — Ambulatory Visit
Admission: RE | Admit: 2016-07-13 | Discharge: 2016-07-13 | Disposition: A | Discharge status: Home / Self Care | Payer: 59 | Source: Ambulatory Visit | Attending: Internal Medicine | Admitting: Internal Medicine

## 2016-07-13 DIAGNOSIS — E042 Nontoxic multinodular goiter: Secondary | ICD-10-CM | POA: Diagnosis not present

## 2016-07-17 DIAGNOSIS — Z01419 Encounter for gynecological examination (general) (routine) without abnormal findings: Secondary | ICD-10-CM | POA: Diagnosis not present

## 2016-07-17 DIAGNOSIS — Z6836 Body mass index (BMI) 36.0-36.9, adult: Secondary | ICD-10-CM | POA: Diagnosis not present

## 2016-07-24 DIAGNOSIS — H5213 Myopia, bilateral: Secondary | ICD-10-CM | POA: Diagnosis not present

## 2016-07-24 MED FILL — BEPREVE 1.5% EYE DROPS: 1.5 | 18 days supply | Qty: 5 | Fill #0

## 2016-07-27 DIAGNOSIS — Z1211 Encounter for screening for malignant neoplasm of colon: Secondary | ICD-10-CM | POA: Diagnosis not present

## 2016-07-27 DIAGNOSIS — K5730 Diverticulosis of large intestine without perforation or abscess without bleeding: Secondary | ICD-10-CM | POA: Diagnosis not present

## 2016-08-10 MED FILL — BEPREVE 1.5% EYE DROPS: 1.5 | 18 days supply | Qty: 5 | Fill #1

## 2016-09-10 DIAGNOSIS — J0120 Acute ethmoidal sinusitis, unspecified: Secondary | ICD-10-CM | POA: Diagnosis not present

## 2016-09-10 DIAGNOSIS — J209 Acute bronchitis, unspecified: Secondary | ICD-10-CM | POA: Diagnosis not present

## 2016-09-10 MED FILL — HYDROCODONE-CHLORPHENIRAM S: 10-8 | 7 days supply | Qty: 70 | Fill #0

## 2016-09-10 MED FILL — AZITHROMYCIN 250 MG TABLET: 250 | 5 days supply | Qty: 6 | Fill #0

## 2016-09-10 MED FILL — LOSARTAN-HCTZ 50-12.5 MG TA: 50-12.5 | 90 days supply | Qty: 90 | Fill #0

## 2016-09-17 DIAGNOSIS — G4733 Obstructive sleep apnea (adult) (pediatric): Secondary | ICD-10-CM | POA: Diagnosis not present

## 2016-09-20 ENCOUNTER — Emergency Department (HOSPITAL_COMMUNITY)
Admission: EM | Admit: 2016-09-20 | Discharge: 2016-09-20 | Disposition: A | Discharge status: Home / Self Care | Payer: 59 | Attending: Emergency Medicine | Admitting: Emergency Medicine

## 2016-09-20 ENCOUNTER — Emergency Department (HOSPITAL_COMMUNITY): Payer: 59

## 2016-09-20 DIAGNOSIS — J40 Bronchitis, not specified as acute or chronic: Secondary | ICD-10-CM

## 2016-09-20 DIAGNOSIS — R509 Fever, unspecified: Secondary | ICD-10-CM | POA: Diagnosis not present

## 2016-09-20 DIAGNOSIS — B9789 Other viral agents as the cause of diseases classified elsewhere: Secondary | ICD-10-CM

## 2016-09-20 DIAGNOSIS — Z79899 Other long term (current) drug therapy: Secondary | ICD-10-CM | POA: Insufficient documentation

## 2016-09-20 DIAGNOSIS — Z7982 Long term (current) use of aspirin: Secondary | ICD-10-CM | POA: Diagnosis not present

## 2016-09-20 DIAGNOSIS — R059 Cough, unspecified: Secondary | ICD-10-CM

## 2016-09-20 DIAGNOSIS — R05 Cough: Secondary | ICD-10-CM

## 2016-09-20 DIAGNOSIS — J069 Acute upper respiratory infection, unspecified: Secondary | ICD-10-CM | POA: Diagnosis not present

## 2016-09-20 DIAGNOSIS — I10 Essential (primary) hypertension: Secondary | ICD-10-CM | POA: Insufficient documentation

## 2016-09-20 MED ORDER — AEROCHAMBER PLUS FLO-VU MEDIUM MISC: 1.0000 | Freq: Once | Status: DC

## 2016-09-20 MED ORDER — ALBUTEROL SULFATE (2.5 MG/3ML) 0.083% IN NEBU
5.0000 mg | INHALATION_SOLUTION | Freq: Once | RESPIRATORY_TRACT | Status: AC
  Administered 2016-09-20: 5 mg via RESPIRATORY_TRACT
  Filled 2016-09-20: qty 6

## 2016-09-20 MED ORDER — ALBUTEROL SULFATE HFA 108 (90 BASE) MCG/ACT IN AERS
2.0000 | INHALATION_SPRAY | RESPIRATORY_TRACT | Status: DC | PRN
  Administered 2016-09-20: 2 via RESPIRATORY_TRACT
  Filled 2016-09-20: qty 6.7

## 2016-09-20 MED ORDER — GUAIFENESIN ER 1200 MG PO TB12: 1.0000 | ORAL_TABLET | Freq: Two times a day (BID) | ORAL | 0 refills | Status: DC

## 2016-09-20 MED ORDER — PROMETHAZINE-DM 6.25-15 MG/5ML PO SYRP: 5.0000 mL | ORAL_SOLUTION | Freq: Four times a day (QID) | ORAL | 0 refills | Status: DC | PRN

## 2016-09-20 MED ORDER — DEXAMETHASONE SODIUM PHOSPHATE 10 MG/ML IJ SOLN
10.0000 mg | Freq: Once | INTRAMUSCULAR | Status: AC
  Administered 2016-09-20: 10 mg via INTRAMUSCULAR
  Filled 2016-09-20: qty 1

## 2016-09-20 MED ORDER — HYDROCOD POLST-CPM POLST ER 10-8 MG/5ML PO SUER
5.0000 mL | Freq: Once | ORAL | Status: AC
  Administered 2016-09-20: 5 mL via ORAL
  Filled 2016-09-20: qty 5

## 2016-09-20 MED ORDER — PREDNISONE 50 MG PO TABS: 50.0000 mg | ORAL_TABLET | Freq: Every day | ORAL | 0 refills | Status: DC

## 2016-09-20 MED ORDER — IPRATROPIUM BROMIDE 0.02 % IN SOLN
0.5000 mg | Freq: Once | RESPIRATORY_TRACT | Status: AC
  Administered 2016-09-20: 0.5 mg via RESPIRATORY_TRACT
  Filled 2016-09-20: qty 2.5

## 2016-09-20 NOTE — Discharge Instructions (Signed)
Return here for any worsening in your condition.  Follow-up with your primary care Dr. increase your fluid intake and rest as much as possible

## 2016-09-20 NOTE — ED Notes (Signed)
Pt to xray

## 2016-09-20 NOTE — ED Notes (Signed)
ED Provider at bedside. 

## 2016-09-20 NOTE — ED Provider Notes (Signed)
[place] DEPT Provider Note   CSN: [id number] Arrival date & time: 09/20/16  2033     History   Chief Complaint Chief Complaint  Patient presents with  . Cough    HPI Melissa Morrow is a 60 y.o. female.  HPI Patient presents to the emergency department with cough that has been persistent over the last few weeks.  Patient states she had a recent upper respiratory infection and was seen by her doctor and given Zithromax and Tussionex.  She states that her symptoms seemed to get mostly better, but did not completely resolve states that over the last few days her symptoms have gotten worse.  She states that the cough continues.  Patient states that she has had episodes tonight where she has had coughing fits that it lasted for several minutes and it causes her to feel like she is going to vomit. The patient denies chest pain, shortness of breath, headache,blurred vision, neck pain, fever, cough, weakness, numbness, dizziness, anorexia, edema, abdominal pain, nausea, vomiting, diarrhea, rash, back pain, dysuria, hematemesis, bloody stool, near syncope, or syncope. Past Medical History:  Diagnosis Date  . DVT (deep venous thrombosis)    Confirm location with patient.  . Hypertension    Mild  . Obesity     Patient Active Problem List   Diagnosis Date Noted  . High blood pressure 03/11/2011  . Hearing loss 03/11/2011  . Contact lens/glasses fitting 03/11/2011  . Menopause 03/11/2011  . Family history of breast cancer 03/11/2011    Past Surgical History:  Procedure Laterality Date  . APPENDECTOMY  1971  . 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 BUNIONECTOMY  [id number] and 1996  . [place]  . GALLBLADDER SURGERY  2004  . Placement of Greenfield Filter  1988    OB History    No data available       Home Medications    Prior to Admission medications   Medication Sig Start Date End Date Taking? Authorizing  Provider  aspirin 81 MG tablet Take 81 mg by mouth daily.      Historical Provider, MD  calcium carbonate (TUMS - DOSED IN MG ELEMENTAL CALCIUM) 500 MG chewable tablet Chew 1 tablet by mouth daily. Verify type with patient. Only noted "calcium" on history form dated 01/21/11.     Historical Provider, MD  lisinopril-hydrochlorothiazide (PRINZIDE,ZESTORETIC) 10-12.5 MG per tablet Take 1 tablet by mouth daily.      Historical Provider, MD  Multiple Vitamin (MULTIVITAMIN) tablet Take 1 tablet by mouth daily.      Historical Provider, MD    Family History Family History  Problem Relation Age of Onset  . Heart disease Father     Acute MI    Social History Social History  Substance Use Topics  . Smoking status: Never Smoker  . Smokeless tobacco: Not on file  . Alcohol use Yes     Comment: 1-2 glasses of wine per week.     Allergies   Gadolinium derivatives and Ace inhibitors   Review of Systems Review of Systems All other systems negative except as documented in the HPI. All pertinent positives and negatives as reviewed in the HPI.  Physical Exam Updated Vital Signs BP 150/73 (BP Location: Right Arm)   Pulse 104   Temp 98.7 F (37.1 C) (Oral)   Resp 20   SpO2 93%   Physical Exam  Constitutional: She is  oriented to person, place, and time. She appears well-developed and well-nourished. No distress.  HENT:  Head: Normocephalic and atraumatic.  Mouth/Throat: Oropharynx is clear and moist.  Eyes: Pupils are equal, round, and reactive to light.  Neck: Normal range of motion. Neck supple.  Cardiovascular: Normal rate, regular rhythm and normal heart sounds.  Exam reveals no gallop and no friction rub.   No murmur heard. Pulmonary/Chest: Effort normal and breath sounds normal. No respiratory distress. She has no wheezes.  Musculoskeletal: Normal range of motion. She exhibits no edema.  Neurological: She is alert and oriented to person, place, and time. She exhibits normal muscle  tone. Coordination normal.  Skin: Skin is warm and dry. No rash noted. No erythema.  Psychiatric: She has a normal mood and affect. Her behavior is normal.  Nursing note and vitals reviewed.    ED Treatments / Results  Labs (all labs ordered are listed, but only abnormal results are displayed) Labs Reviewed - No data to display  EKG  EKG Interpretation None       Radiology Dg Chest 2 View  Result Date: 09/20/2016 CLINICAL DATA:  Cough and congestion for 3 weeks.  Fever today. EXAM: CHEST  2 VIEW COMPARISON:  None. FINDINGS: The heart size and mediastinal contours are within normal limits. Both lungs are clear. The visualized skeletal structures are unremarkable. IMPRESSION: No active cardiopulmonary disease. Electronically Signed   By: Andreas Newport M.D.   On: 09/20/2016 21:30    Procedures Procedures (including critical care time)  Medications Ordered in ED Medications  dexamethasone (DECADRON) injection 10 mg (10 mg Intramuscular Given 09/20/16 2126)  albuterol (PROVENTIL) (2.5 MG/3ML) 0.083% nebulizer solution 5 mg (5 mg Nebulization Given 09/20/16 2126)  ipratropium (ATROVENT) nebulizer solution 0.5 mg (0.5 mg Nebulization Given 09/20/16 2126)  chlorpheniramine-HYDROcodone (TUSSIONEX) 10-8 MG/5ML suspension 5 mL (5 mLs Oral Given 09/20/16 2117)     Initial Impression / Assessment and Plan / ED Course  I have reviewed the triage vital signs and the nursing notes.  Pertinent labs & imaging results that were available during my care of the patient were reviewed by me and considered in my medical decision making (see chart for details).  Clinical Course     Patient most likely still has continued upper respiratory illness with the residual symptoms, still affecting her.  Patient will be given albuterol inhaler along with prednisone and cough suppressant.  She is advised to follow-up with her primary care Dr. told to return here as needed.  She is given an nebulizer  treatment here in the emergency department along with Tussionex.  Patient agrees the plan and all questions were answered  Final Clinical Impressions(s) / ED Diagnoses   Final diagnoses:  None    New Prescriptions New Prescriptions   No medications on file     Dalia Heading, PA-C 09/20/16 2159    Drenda Freeze, MD 09/21/16 [phone]

## 2016-09-20 NOTE — ED Triage Notes (Signed)
Pt states that she was treated recently for a sinus infection and cough that went away but has come back stronger and she has had these coughing fits causing her to feel like she can't catch her breath. Alert and oriented.

## 2016-09-22 DIAGNOSIS — I10 Essential (primary) hypertension: Secondary | ICD-10-CM | POA: Diagnosis not present

## 2016-09-22 DIAGNOSIS — E042 Nontoxic multinodular goiter: Secondary | ICD-10-CM | POA: Diagnosis not present

## 2016-09-22 DIAGNOSIS — G4733 Obstructive sleep apnea (adult) (pediatric): Secondary | ICD-10-CM | POA: Diagnosis not present

## 2016-09-22 DIAGNOSIS — J209 Acute bronchitis, unspecified: Secondary | ICD-10-CM | POA: Diagnosis not present

## 2016-09-22 DIAGNOSIS — J4521 Mild intermittent asthma with (acute) exacerbation: Secondary | ICD-10-CM | POA: Diagnosis not present

## 2016-09-22 DIAGNOSIS — K219 Gastro-esophageal reflux disease without esophagitis: Secondary | ICD-10-CM | POA: Diagnosis not present

## 2016-09-22 MED FILL — ALBUTEROL 0.083% INHAL SOLN: (2.5 MG/3ML | 7 days supply | Qty: 90 | Fill #0

## 2016-09-29 DIAGNOSIS — Z9989 Dependence on other enabling machines and devices: Secondary | ICD-10-CM | POA: Diagnosis not present

## 2016-09-29 DIAGNOSIS — I10 Essential (primary) hypertension: Secondary | ICD-10-CM | POA: Diagnosis not present

## 2016-09-29 DIAGNOSIS — E669 Obesity, unspecified: Secondary | ICD-10-CM | POA: Diagnosis not present

## 2016-09-29 DIAGNOSIS — G4730 Sleep apnea, unspecified: Secondary | ICD-10-CM | POA: Diagnosis not present

## 2016-09-29 DIAGNOSIS — R635 Abnormal weight gain: Secondary | ICD-10-CM | POA: Diagnosis not present

## 2016-09-29 DIAGNOSIS — Z6835 Body mass index (BMI) 35.0-35.9, adult: Secondary | ICD-10-CM | POA: Diagnosis not present

## 2016-10-06 DIAGNOSIS — J4521 Mild intermittent asthma with (acute) exacerbation: Secondary | ICD-10-CM | POA: Diagnosis not present

## 2016-10-06 DIAGNOSIS — Z6836 Body mass index (BMI) 36.0-36.9, adult: Secondary | ICD-10-CM | POA: Diagnosis not present

## 2016-10-06 DIAGNOSIS — I10 Essential (primary) hypertension: Secondary | ICD-10-CM | POA: Diagnosis not present

## 2016-10-19 MED FILL — PHENDIMETRAZINE 35 MG TAB: 35 | 30 days supply | Qty: 30 | Fill #1

## 2016-11-02 DIAGNOSIS — Z6835 Body mass index (BMI) 35.0-35.9, adult: Secondary | ICD-10-CM | POA: Diagnosis not present

## 2016-11-02 DIAGNOSIS — Z9889 Other specified postprocedural states: Secondary | ICD-10-CM | POA: Diagnosis not present

## 2016-11-02 DIAGNOSIS — R635 Abnormal weight gain: Secondary | ICD-10-CM | POA: Diagnosis not present

## 2016-11-02 DIAGNOSIS — G4730 Sleep apnea, unspecified: Secondary | ICD-10-CM | POA: Diagnosis not present

## 2016-11-02 DIAGNOSIS — E669 Obesity, unspecified: Secondary | ICD-10-CM | POA: Diagnosis not present

## 2016-11-02 DIAGNOSIS — I10 Essential (primary) hypertension: Secondary | ICD-10-CM | POA: Diagnosis not present

## 2016-11-02 DIAGNOSIS — Z86018 Personal history of other benign neoplasm: Secondary | ICD-10-CM | POA: Diagnosis not present

## 2016-11-02 DIAGNOSIS — Z9989 Dependence on other enabling machines and devices: Secondary | ICD-10-CM | POA: Diagnosis not present

## 2016-11-12 DIAGNOSIS — E669 Obesity, unspecified: Secondary | ICD-10-CM | POA: Diagnosis not present

## 2016-11-12 DIAGNOSIS — Z713 Dietary counseling and surveillance: Secondary | ICD-10-CM | POA: Diagnosis not present

## 2016-11-30 MED FILL — LOSARTAN-HCTZ 50-12.5 MG TA: 50-12.5 | 90 days supply | Qty: 90 | Fill #1

## 2016-12-17 DIAGNOSIS — G4733 Obstructive sleep apnea (adult) (pediatric): Secondary | ICD-10-CM | POA: Diagnosis not present

## 2016-12-24 DIAGNOSIS — Z6834 Body mass index (BMI) 34.0-34.9, adult: Secondary | ICD-10-CM | POA: Diagnosis not present

## 2016-12-24 DIAGNOSIS — E669 Obesity, unspecified: Secondary | ICD-10-CM | POA: Diagnosis not present

## 2016-12-24 DIAGNOSIS — Z713 Dietary counseling and surveillance: Secondary | ICD-10-CM | POA: Diagnosis not present

## 2016-12-29 DIAGNOSIS — E669 Obesity, unspecified: Secondary | ICD-10-CM | POA: Diagnosis not present

## 2016-12-29 DIAGNOSIS — Z86018 Personal history of other benign neoplasm: Secondary | ICD-10-CM | POA: Diagnosis not present

## 2016-12-29 DIAGNOSIS — I10 Essential (primary) hypertension: Secondary | ICD-10-CM | POA: Diagnosis not present

## 2016-12-29 DIAGNOSIS — Z6835 Body mass index (BMI) 35.0-35.9, adult: Secondary | ICD-10-CM | POA: Diagnosis not present

## 2016-12-29 DIAGNOSIS — G4730 Sleep apnea, unspecified: Secondary | ICD-10-CM | POA: Diagnosis not present

## 2016-12-29 DIAGNOSIS — Z9989 Dependence on other enabling machines and devices: Secondary | ICD-10-CM | POA: Diagnosis not present

## 2016-12-29 DIAGNOSIS — Z713 Dietary counseling and surveillance: Secondary | ICD-10-CM | POA: Diagnosis not present

## 2017-02-01 DIAGNOSIS — G4733 Obstructive sleep apnea (adult) (pediatric): Secondary | ICD-10-CM | POA: Diagnosis not present

## 2017-02-08 DIAGNOSIS — G4730 Sleep apnea, unspecified: Secondary | ICD-10-CM | POA: Diagnosis not present

## 2017-02-08 DIAGNOSIS — Z86018 Personal history of other benign neoplasm: Secondary | ICD-10-CM | POA: Diagnosis not present

## 2017-02-08 DIAGNOSIS — I10 Essential (primary) hypertension: Secondary | ICD-10-CM | POA: Diagnosis not present

## 2017-02-08 DIAGNOSIS — E669 Obesity, unspecified: Secondary | ICD-10-CM | POA: Diagnosis not present

## 2017-02-08 DIAGNOSIS — Z6836 Body mass index (BMI) 36.0-36.9, adult: Secondary | ICD-10-CM | POA: Diagnosis not present

## 2017-02-08 DIAGNOSIS — Z713 Dietary counseling and surveillance: Secondary | ICD-10-CM | POA: Diagnosis not present

## 2017-02-08 DIAGNOSIS — Z9889 Other specified postprocedural states: Secondary | ICD-10-CM | POA: Diagnosis not present

## 2017-02-12 MED FILL — BEPREVE 1.5% EYE DROPS: 1.5 | 25 days supply | Qty: 5 | Fill #2

## 2017-03-08 MED FILL — LOSARTAN POTASSIUM-HCTZ 50-: 50-12.5 | 90 days supply | Qty: 90 | Fill #2

## 2017-04-05 DIAGNOSIS — Z566 Other physical and mental strain related to work: Secondary | ICD-10-CM | POA: Diagnosis not present

## 2017-04-05 DIAGNOSIS — I10 Essential (primary) hypertension: Secondary | ICD-10-CM | POA: Diagnosis not present

## 2017-04-05 DIAGNOSIS — Z713 Dietary counseling and surveillance: Secondary | ICD-10-CM | POA: Diagnosis not present

## 2017-04-05 DIAGNOSIS — Z6836 Body mass index (BMI) 36.0-36.9, adult: Secondary | ICD-10-CM | POA: Diagnosis not present

## 2017-04-05 DIAGNOSIS — E669 Obesity, unspecified: Secondary | ICD-10-CM | POA: Diagnosis not present

## 2017-04-05 DIAGNOSIS — G4730 Sleep apnea, unspecified: Secondary | ICD-10-CM | POA: Diagnosis not present

## 2017-04-05 DIAGNOSIS — Z9989 Dependence on other enabling machines and devices: Secondary | ICD-10-CM | POA: Diagnosis not present

## 2017-04-05 DIAGNOSIS — F320 Major depressive disorder, single episode, mild: Secondary | ICD-10-CM | POA: Diagnosis not present

## 2017-04-05 MED FILL — SERTRALINE HCL 50 MG TABLET: 50 | 90 days supply | Qty: 90 | Fill #0

## 2017-04-14 DIAGNOSIS — G4733 Obstructive sleep apnea (adult) (pediatric): Secondary | ICD-10-CM | POA: Diagnosis not present

## 2017-04-25 MED FILL — BEPREVE 1.5% EYE DROPS: 1.5 | 25 days supply | Qty: 5 | Fill #3

## 2017-04-26 MED FILL — PHENDIMETRAZINE 35 MG TAB: 35 | 30 days supply | Qty: 30 | Fill #0

## 2017-06-03 MED FILL — LOSARTAN-HCTZ 50-12.5 MG TA: 50-12.5 | 90 days supply | Qty: 90 | Fill #0

## 2017-06-16 DIAGNOSIS — G4733 Obstructive sleep apnea (adult) (pediatric): Secondary | ICD-10-CM | POA: Diagnosis not present

## 2017-06-23 DIAGNOSIS — E669 Obesity, unspecified: Secondary | ICD-10-CM | POA: Diagnosis not present

## 2017-06-23 DIAGNOSIS — Z713 Dietary counseling and surveillance: Secondary | ICD-10-CM | POA: Diagnosis not present

## 2017-06-28 MED FILL — CLOBETASOL 0.05% GEL: 0.05 | 14 days supply | Qty: 30 | Fill #0

## 2017-07-15 ENCOUNTER — Other Ambulatory Visit: Payer: Self-pay | Admitting: Obstetrics

## 2017-07-15 DIAGNOSIS — Z1231 Encounter for screening mammogram for malignant neoplasm of breast: Secondary | ICD-10-CM

## 2017-07-29 DIAGNOSIS — H52203 Unspecified astigmatism, bilateral: Secondary | ICD-10-CM | POA: Diagnosis not present

## 2017-07-29 DIAGNOSIS — H524 Presbyopia: Secondary | ICD-10-CM | POA: Diagnosis not present

## 2017-08-06 ENCOUNTER — Ambulatory Visit: Payer: 59

## 2017-08-06 ENCOUNTER — Ambulatory Visit
Admission: RE | Admit: 2017-08-06 | Discharge: 2017-08-06 | Disposition: A | Discharge status: Home / Self Care | Payer: 59 | Source: Ambulatory Visit | Attending: Obstetrics | Admitting: Obstetrics

## 2017-08-06 DIAGNOSIS — Z1231 Encounter for screening mammogram for malignant neoplasm of breast: Secondary | ICD-10-CM | POA: Diagnosis not present

## 2017-08-20 MED FILL — BEPREVE 1.5% EYE DROPS: 1.5 | 25 days supply | Qty: 5 | Fill #0

## 2017-08-23 DIAGNOSIS — I10 Essential (primary) hypertension: Secondary | ICD-10-CM | POA: Diagnosis not present

## 2017-08-23 DIAGNOSIS — G4733 Obstructive sleep apnea (adult) (pediatric): Secondary | ICD-10-CM | POA: Diagnosis not present

## 2017-08-23 DIAGNOSIS — Z0000 Encounter for general adult medical examination without abnormal findings: Secondary | ICD-10-CM | POA: Diagnosis not present

## 2017-08-23 DIAGNOSIS — K219 Gastro-esophageal reflux disease without esophagitis: Secondary | ICD-10-CM | POA: Diagnosis not present

## 2017-08-23 DIAGNOSIS — Z6836 Body mass index (BMI) 36.0-36.9, adult: Secondary | ICD-10-CM | POA: Diagnosis not present

## 2017-08-23 DIAGNOSIS — J4520 Mild intermittent asthma, uncomplicated: Secondary | ICD-10-CM | POA: Diagnosis not present

## 2017-08-23 MED FILL — LOSARTAN-HCTZ 50-12.5 MG TA: 50-12.5 | 90 days supply | Qty: 90 | Fill #0

## 2017-08-23 MED FILL — PHENDIMETRAZINE 35 MG TAB: 35 | 30 days supply | Qty: 30 | Fill #1

## 2017-09-03 DIAGNOSIS — S060X0A Concussion without loss of consciousness, initial encounter: Secondary | ICD-10-CM | POA: Diagnosis not present

## 2017-09-20 DIAGNOSIS — G4733 Obstructive sleep apnea (adult) (pediatric): Secondary | ICD-10-CM | POA: Diagnosis not present

## 2017-09-28 DIAGNOSIS — E669 Obesity, unspecified: Secondary | ICD-10-CM | POA: Diagnosis not present

## 2017-09-28 DIAGNOSIS — Z713 Dietary counseling and surveillance: Secondary | ICD-10-CM | POA: Diagnosis not present

## 2017-10-14 DIAGNOSIS — F419 Anxiety disorder, unspecified: Secondary | ICD-10-CM | POA: Diagnosis not present

## 2017-10-14 DIAGNOSIS — Z6836 Body mass index (BMI) 36.0-36.9, adult: Secondary | ICD-10-CM | POA: Diagnosis not present

## 2017-10-14 DIAGNOSIS — Z713 Dietary counseling and surveillance: Secondary | ICD-10-CM | POA: Diagnosis not present

## 2017-10-14 DIAGNOSIS — Z9989 Dependence on other enabling machines and devices: Secondary | ICD-10-CM | POA: Diagnosis not present

## 2017-10-14 DIAGNOSIS — G4730 Sleep apnea, unspecified: Secondary | ICD-10-CM | POA: Diagnosis not present

## 2017-10-14 DIAGNOSIS — E669 Obesity, unspecified: Secondary | ICD-10-CM | POA: Diagnosis not present

## 2017-10-14 DIAGNOSIS — Z01419 Encounter for gynecological examination (general) (routine) without abnormal findings: Secondary | ICD-10-CM | POA: Diagnosis not present

## 2017-10-14 DIAGNOSIS — F329 Major depressive disorder, single episode, unspecified: Secondary | ICD-10-CM | POA: Diagnosis not present

## 2017-10-14 DIAGNOSIS — N760 Acute vaginitis: Secondary | ICD-10-CM | POA: Diagnosis not present

## 2017-10-14 DIAGNOSIS — Z1151 Encounter for screening for human papillomavirus (HPV): Secondary | ICD-10-CM | POA: Diagnosis not present

## 2017-10-14 DIAGNOSIS — I10 Essential (primary) hypertension: Secondary | ICD-10-CM | POA: Diagnosis not present

## 2017-10-14 MED FILL — TINIDAZOLE 500 MG TABLET: 500 | 5 days supply | Qty: 10 | Fill #0

## 2017-10-14 MED FILL — buPROPion HCL 75 MG TABS: 75 | 30 days supply | Qty: 60 | Fill #0

## 2017-11-08 MED FILL — buPROPion HCL 75 MG TABS: 75 | 30 days supply | Qty: 60 | Fill #1

## 2017-11-09 MED FILL — BEPREVE 1.5% EYE DROPS: 1.5 | 25 days supply | Qty: 5 | Fill #1

## 2017-11-10 DIAGNOSIS — Z95828 Presence of other vascular implants and grafts: Secondary | ICD-10-CM | POA: Diagnosis not present

## 2017-11-10 DIAGNOSIS — M19049 Primary osteoarthritis, unspecified hand: Secondary | ICD-10-CM | POA: Diagnosis not present

## 2017-11-10 DIAGNOSIS — I10 Essential (primary) hypertension: Secondary | ICD-10-CM | POA: Diagnosis not present

## 2017-11-10 DIAGNOSIS — Z86018 Personal history of other benign neoplasm: Secondary | ICD-10-CM | POA: Diagnosis not present

## 2017-11-10 DIAGNOSIS — E042 Nontoxic multinodular goiter: Secondary | ICD-10-CM | POA: Diagnosis not present

## 2017-11-10 DIAGNOSIS — Z86718 Personal history of other venous thrombosis and embolism: Secondary | ICD-10-CM | POA: Diagnosis not present

## 2017-11-10 DIAGNOSIS — G4733 Obstructive sleep apnea (adult) (pediatric): Secondary | ICD-10-CM | POA: Diagnosis not present

## 2017-11-10 DIAGNOSIS — H9192 Unspecified hearing loss, left ear: Secondary | ICD-10-CM | POA: Diagnosis not present

## 2017-11-10 DIAGNOSIS — Z1389 Encounter for screening for other disorder: Secondary | ICD-10-CM | POA: Diagnosis not present

## 2017-11-10 MED FILL — DICLOFENAC SODIUM 1% GEL: 1 | 12 days supply | Qty: 300 | Fill #0

## 2017-11-11 DIAGNOSIS — Z6835 Body mass index (BMI) 35.0-35.9, adult: Secondary | ICD-10-CM | POA: Diagnosis not present

## 2017-11-11 DIAGNOSIS — F419 Anxiety disorder, unspecified: Secondary | ICD-10-CM | POA: Diagnosis not present

## 2017-11-11 DIAGNOSIS — I10 Essential (primary) hypertension: Secondary | ICD-10-CM | POA: Diagnosis not present

## 2017-11-11 DIAGNOSIS — F329 Major depressive disorder, single episode, unspecified: Secondary | ICD-10-CM | POA: Diagnosis not present

## 2017-11-11 DIAGNOSIS — Z9989 Dependence on other enabling machines and devices: Secondary | ICD-10-CM | POA: Diagnosis not present

## 2017-11-11 DIAGNOSIS — G4730 Sleep apnea, unspecified: Secondary | ICD-10-CM | POA: Diagnosis not present

## 2017-11-11 DIAGNOSIS — E669 Obesity, unspecified: Secondary | ICD-10-CM | POA: Diagnosis not present

## 2017-11-24 DIAGNOSIS — L603 Nail dystrophy: Secondary | ICD-10-CM | POA: Diagnosis not present

## 2017-11-24 DIAGNOSIS — D1801 Hemangioma of skin and subcutaneous tissue: Secondary | ICD-10-CM | POA: Diagnosis not present

## 2017-12-02 MED FILL — LOSARTAN-HCTZ 50-12.5 MG TA: 50-12.5 | 90 days supply | Qty: 90 | Fill #0

## 2017-12-09 DIAGNOSIS — Z713 Dietary counseling and surveillance: Secondary | ICD-10-CM | POA: Diagnosis not present

## 2017-12-09 DIAGNOSIS — E669 Obesity, unspecified: Secondary | ICD-10-CM | POA: Diagnosis not present

## 2017-12-09 DIAGNOSIS — F419 Anxiety disorder, unspecified: Secondary | ICD-10-CM | POA: Diagnosis not present

## 2017-12-09 DIAGNOSIS — F329 Major depressive disorder, single episode, unspecified: Secondary | ICD-10-CM | POA: Diagnosis not present

## 2017-12-09 DIAGNOSIS — G4730 Sleep apnea, unspecified: Secondary | ICD-10-CM | POA: Diagnosis not present

## 2017-12-09 DIAGNOSIS — Z6836 Body mass index (BMI) 36.0-36.9, adult: Secondary | ICD-10-CM | POA: Diagnosis not present

## 2017-12-09 DIAGNOSIS — I10 Essential (primary) hypertension: Secondary | ICD-10-CM | POA: Diagnosis not present

## 2017-12-09 DIAGNOSIS — Z9989 Dependence on other enabling machines and devices: Secondary | ICD-10-CM | POA: Diagnosis not present

## 2017-12-17 MED FILL — buPROPion HCL 75 MG TABS: 75 | 30 days supply | Qty: 60 | Fill #0

## 2017-12-30 DIAGNOSIS — F419 Anxiety disorder, unspecified: Secondary | ICD-10-CM | POA: Diagnosis not present

## 2018-01-12 DIAGNOSIS — L608 Other nail disorders: Secondary | ICD-10-CM | POA: Diagnosis not present

## 2018-01-12 DIAGNOSIS — M79641 Pain in right hand: Secondary | ICD-10-CM | POA: Diagnosis not present

## 2018-01-12 DIAGNOSIS — M79645 Pain in left finger(s): Secondary | ICD-10-CM | POA: Diagnosis not present

## 2018-01-12 DIAGNOSIS — M13849 Other specified arthritis, unspecified hand: Secondary | ICD-10-CM | POA: Diagnosis not present

## 2018-01-12 DIAGNOSIS — L603 Nail dystrophy: Secondary | ICD-10-CM | POA: Diagnosis not present

## 2018-01-13 DIAGNOSIS — Z6836 Body mass index (BMI) 36.0-36.9, adult: Secondary | ICD-10-CM | POA: Diagnosis not present

## 2018-01-13 DIAGNOSIS — F419 Anxiety disorder, unspecified: Secondary | ICD-10-CM | POA: Diagnosis not present

## 2018-01-13 DIAGNOSIS — F329 Major depressive disorder, single episode, unspecified: Secondary | ICD-10-CM | POA: Diagnosis not present

## 2018-01-13 DIAGNOSIS — I10 Essential (primary) hypertension: Secondary | ICD-10-CM | POA: Diagnosis not present

## 2018-01-13 DIAGNOSIS — E669 Obesity, unspecified: Secondary | ICD-10-CM | POA: Diagnosis not present

## 2018-01-13 MED FILL — BUPROPION HCL SR 100 MG TAB: 100 | 30 days supply | Qty: 60 | Fill #0

## 2018-01-25 DIAGNOSIS — F329 Major depressive disorder, single episode, unspecified: Secondary | ICD-10-CM | POA: Diagnosis not present

## 2018-01-25 DIAGNOSIS — F419 Anxiety disorder, unspecified: Secondary | ICD-10-CM | POA: Diagnosis not present

## 2018-02-04 DIAGNOSIS — H5711 Ocular pain, right eye: Secondary | ICD-10-CM | POA: Diagnosis not present

## 2018-02-04 DIAGNOSIS — H0012 Chalazion right lower eyelid: Secondary | ICD-10-CM | POA: Diagnosis not present

## 2018-02-04 MED FILL — OFLOXACIN 0.3% EYE DROPS: 0.3 | 9 days supply | Qty: 5 | Fill #0

## 2018-02-08 DIAGNOSIS — F419 Anxiety disorder, unspecified: Secondary | ICD-10-CM | POA: Diagnosis not present

## 2018-02-08 DIAGNOSIS — F329 Major depressive disorder, single episode, unspecified: Secondary | ICD-10-CM | POA: Diagnosis not present

## 2018-02-08 MED FILL — BUPROPION HCL SR 100 MG TAB: 100 | 30 days supply | Qty: 60 | Fill #1

## 2018-02-11 MED FILL — OFLOXACIN 0.3% EYE DROPS: 0.3 | 12 days supply | Qty: 5 | Fill #1

## 2018-02-22 DIAGNOSIS — F329 Major depressive disorder, single episode, unspecified: Secondary | ICD-10-CM | POA: Diagnosis not present

## 2018-02-22 DIAGNOSIS — F419 Anxiety disorder, unspecified: Secondary | ICD-10-CM | POA: Diagnosis not present

## 2018-02-22 DIAGNOSIS — E669 Obesity, unspecified: Secondary | ICD-10-CM | POA: Diagnosis not present

## 2018-02-22 DIAGNOSIS — I10 Essential (primary) hypertension: Secondary | ICD-10-CM | POA: Diagnosis not present

## 2018-02-22 DIAGNOSIS — Z6836 Body mass index (BMI) 36.0-36.9, adult: Secondary | ICD-10-CM | POA: Diagnosis not present

## 2018-02-22 MED FILL — NALTREXONE 50 MG TABLET: 50 | 60 days supply | Qty: 30 | Fill #0

## 2018-02-28 MED FILL — LOSARTAN-HCTZ 50-12.5 MG TA: 50-12.5 | 90 days supply | Qty: 90 | Fill #1

## 2018-03-08 DIAGNOSIS — F419 Anxiety disorder, unspecified: Secondary | ICD-10-CM | POA: Diagnosis not present

## 2018-03-08 DIAGNOSIS — F329 Major depressive disorder, single episode, unspecified: Secondary | ICD-10-CM | POA: Diagnosis not present

## 2018-03-09 MED FILL — OFLOXACIN 0.3% EYE DROPS: 0.3 | 12 days supply | Qty: 5 | Fill #0

## 2018-03-16 DIAGNOSIS — G4733 Obstructive sleep apnea (adult) (pediatric): Secondary | ICD-10-CM | POA: Diagnosis not present

## 2018-03-16 MED FILL — BUPROPION HCL SR 100 MG TAB: 100 | 90 days supply | Qty: 180 | Fill #0

## 2018-03-22 DIAGNOSIS — F419 Anxiety disorder, unspecified: Secondary | ICD-10-CM | POA: Diagnosis not present

## 2018-03-22 DIAGNOSIS — F329 Major depressive disorder, single episode, unspecified: Secondary | ICD-10-CM | POA: Diagnosis not present

## 2018-04-07 DIAGNOSIS — I10 Essential (primary) hypertension: Secondary | ICD-10-CM | POA: Diagnosis not present

## 2018-04-07 DIAGNOSIS — Z6836 Body mass index (BMI) 36.0-36.9, adult: Secondary | ICD-10-CM | POA: Diagnosis not present

## 2018-04-07 DIAGNOSIS — F419 Anxiety disorder, unspecified: Secondary | ICD-10-CM | POA: Diagnosis not present

## 2018-04-07 DIAGNOSIS — F329 Major depressive disorder, single episode, unspecified: Secondary | ICD-10-CM | POA: Diagnosis not present

## 2018-04-07 DIAGNOSIS — E669 Obesity, unspecified: Secondary | ICD-10-CM | POA: Diagnosis not present

## 2018-04-07 MED FILL — PHENDIMETRAZINE 35 MG TAB: 35 | 30 days supply | Qty: 60 | Fill #0

## 2018-04-11 DIAGNOSIS — E669 Obesity, unspecified: Secondary | ICD-10-CM | POA: Diagnosis not present

## 2018-04-11 DIAGNOSIS — Z6835 Body mass index (BMI) 35.0-35.9, adult: Secondary | ICD-10-CM | POA: Diagnosis not present

## 2018-04-13 DIAGNOSIS — G4733 Obstructive sleep apnea (adult) (pediatric): Secondary | ICD-10-CM | POA: Diagnosis not present

## 2018-05-05 DIAGNOSIS — I10 Essential (primary) hypertension: Secondary | ICD-10-CM | POA: Diagnosis not present

## 2018-05-05 DIAGNOSIS — E669 Obesity, unspecified: Secondary | ICD-10-CM | POA: Diagnosis not present

## 2018-05-05 DIAGNOSIS — Z6835 Body mass index (BMI) 35.0-35.9, adult: Secondary | ICD-10-CM | POA: Diagnosis not present

## 2018-05-13 DIAGNOSIS — G4733 Obstructive sleep apnea (adult) (pediatric): Secondary | ICD-10-CM | POA: Diagnosis not present

## 2018-05-30 MED FILL — LOSARTAN-HCTZ 50-12.5 MG TA: 50-12.5 | 90 days supply | Qty: 90 | Fill #2

## 2018-05-31 DIAGNOSIS — F329 Major depressive disorder, single episode, unspecified: Secondary | ICD-10-CM | POA: Diagnosis not present

## 2018-05-31 DIAGNOSIS — F419 Anxiety disorder, unspecified: Secondary | ICD-10-CM | POA: Diagnosis not present

## 2018-05-31 DIAGNOSIS — R001 Bradycardia, unspecified: Secondary | ICD-10-CM | POA: Diagnosis not present

## 2018-05-31 MED FILL — PHENDIMETRAZINE 35 MG TAB: 35 | 30 days supply | Qty: 60 | Fill #0

## 2018-06-08 DIAGNOSIS — G4733 Obstructive sleep apnea (adult) (pediatric): Secondary | ICD-10-CM | POA: Diagnosis not present

## 2018-06-10 MED FILL — buPROPion HCL ER (SR) 100 M: 100 | 90 days supply | Qty: 180 | Fill #0

## 2018-06-13 DIAGNOSIS — G4733 Obstructive sleep apnea (adult) (pediatric): Secondary | ICD-10-CM | POA: Diagnosis not present

## 2018-06-14 DIAGNOSIS — F329 Major depressive disorder, single episode, unspecified: Secondary | ICD-10-CM | POA: Diagnosis not present

## 2018-06-14 DIAGNOSIS — F419 Anxiety disorder, unspecified: Secondary | ICD-10-CM | POA: Diagnosis not present

## 2018-07-01 DIAGNOSIS — F329 Major depressive disorder, single episode, unspecified: Secondary | ICD-10-CM | POA: Diagnosis not present

## 2018-07-01 DIAGNOSIS — F419 Anxiety disorder, unspecified: Secondary | ICD-10-CM | POA: Diagnosis not present

## 2018-07-01 MED FILL — LOMAIRA 8 MG TABLET: 8 | 20 days supply | Qty: 30 | Fill #0

## 2018-07-08 ENCOUNTER — Other Ambulatory Visit: Payer: Self-pay | Admitting: Obstetrics

## 2018-07-08 DIAGNOSIS — Z1231 Encounter for screening mammogram for malignant neoplasm of breast: Secondary | ICD-10-CM

## 2018-07-12 DIAGNOSIS — F329 Major depressive disorder, single episode, unspecified: Secondary | ICD-10-CM | POA: Diagnosis not present

## 2018-07-12 DIAGNOSIS — F419 Anxiety disorder, unspecified: Secondary | ICD-10-CM | POA: Diagnosis not present

## 2018-07-13 DIAGNOSIS — G4733 Obstructive sleep apnea (adult) (pediatric): Secondary | ICD-10-CM | POA: Diagnosis not present

## 2018-07-19 MED FILL — LOMAIRA 8 MG TABLET: 8 | 90 days supply | Qty: 180 | Fill #0

## 2018-07-20 DIAGNOSIS — G4733 Obstructive sleep apnea (adult) (pediatric): Secondary | ICD-10-CM | POA: Diagnosis not present

## 2018-08-02 DIAGNOSIS — F419 Anxiety disorder, unspecified: Secondary | ICD-10-CM | POA: Diagnosis not present

## 2018-08-02 DIAGNOSIS — F329 Major depressive disorder, single episode, unspecified: Secondary | ICD-10-CM | POA: Diagnosis not present

## 2018-08-13 DIAGNOSIS — G4733 Obstructive sleep apnea (adult) (pediatric): Secondary | ICD-10-CM | POA: Diagnosis not present

## 2018-08-15 DIAGNOSIS — F419 Anxiety disorder, unspecified: Secondary | ICD-10-CM | POA: Diagnosis not present

## 2018-08-15 DIAGNOSIS — F329 Major depressive disorder, single episode, unspecified: Secondary | ICD-10-CM | POA: Diagnosis not present

## 2018-08-17 ENCOUNTER — Ambulatory Visit
Admission: RE | Admit: 2018-08-17 | Discharge: 2018-08-17 | Disposition: A | Discharge status: Home / Self Care | Payer: 59 | Source: Ambulatory Visit | Attending: Obstetrics | Admitting: Obstetrics

## 2018-08-17 DIAGNOSIS — Z1231 Encounter for screening mammogram for malignant neoplasm of breast: Secondary | ICD-10-CM | POA: Diagnosis not present

## 2018-08-30 DIAGNOSIS — E042 Nontoxic multinodular goiter: Secondary | ICD-10-CM | POA: Diagnosis not present

## 2018-08-30 DIAGNOSIS — F419 Anxiety disorder, unspecified: Secondary | ICD-10-CM | POA: Diagnosis not present

## 2018-08-30 DIAGNOSIS — R82998 Other abnormal findings in urine: Secondary | ICD-10-CM | POA: Diagnosis not present

## 2018-08-30 DIAGNOSIS — F329 Major depressive disorder, single episode, unspecified: Secondary | ICD-10-CM | POA: Diagnosis not present

## 2018-08-30 DIAGNOSIS — Z0000 Encounter for general adult medical examination without abnormal findings: Secondary | ICD-10-CM | POA: Diagnosis not present

## 2018-08-30 DIAGNOSIS — I10 Essential (primary) hypertension: Secondary | ICD-10-CM | POA: Diagnosis not present

## 2018-09-02 MED FILL — buPROPion HCL ER (SR) 100 M: 100 | 90 days supply | Qty: 180 | Fill #0

## 2018-09-06 DIAGNOSIS — E668 Other obesity: Secondary | ICD-10-CM | POA: Diagnosis not present

## 2018-09-06 DIAGNOSIS — Z86718 Personal history of other venous thrombosis and embolism: Secondary | ICD-10-CM | POA: Diagnosis not present

## 2018-09-06 DIAGNOSIS — Z780 Asymptomatic menopausal state: Secondary | ICD-10-CM | POA: Diagnosis not present

## 2018-09-06 DIAGNOSIS — I10 Essential (primary) hypertension: Secondary | ICD-10-CM | POA: Diagnosis not present

## 2018-09-06 DIAGNOSIS — E042 Nontoxic multinodular goiter: Secondary | ICD-10-CM | POA: Diagnosis not present

## 2018-09-06 DIAGNOSIS — G4709 Other insomnia: Secondary | ICD-10-CM | POA: Diagnosis not present

## 2018-09-06 DIAGNOSIS — G4733 Obstructive sleep apnea (adult) (pediatric): Secondary | ICD-10-CM | POA: Diagnosis not present

## 2018-09-06 DIAGNOSIS — Z1389 Encounter for screening for other disorder: Secondary | ICD-10-CM | POA: Diagnosis not present

## 2018-09-06 DIAGNOSIS — Z0000 Encounter for general adult medical examination without abnormal findings: Secondary | ICD-10-CM | POA: Diagnosis not present

## 2018-09-06 DIAGNOSIS — Z95828 Presence of other vascular implants and grafts: Secondary | ICD-10-CM | POA: Diagnosis not present

## 2018-09-06 DIAGNOSIS — Z13820 Encounter for screening for osteoporosis: Secondary | ICD-10-CM | POA: Diagnosis not present

## 2018-09-06 DIAGNOSIS — R3121 Asymptomatic microscopic hematuria: Secondary | ICD-10-CM | POA: Diagnosis not present

## 2018-09-06 DIAGNOSIS — Z1212 Encounter for screening for malignant neoplasm of rectum: Secondary | ICD-10-CM | POA: Diagnosis not present

## 2018-09-06 MED FILL — LOSARTAN POTASSIUM 50 MG TA: 50 | 30 days supply | Qty: 30 | Fill #0

## 2018-09-12 DIAGNOSIS — F419 Anxiety disorder, unspecified: Secondary | ICD-10-CM | POA: Diagnosis not present

## 2018-09-15 DIAGNOSIS — H5213 Myopia, bilateral: Secondary | ICD-10-CM | POA: Diagnosis not present

## 2018-09-20 DIAGNOSIS — G4733 Obstructive sleep apnea (adult) (pediatric): Secondary | ICD-10-CM | POA: Diagnosis not present

## 2018-10-03 MED FILL — LOSARTAN POTASSIUM 50 MG TA: 50 | 30 days supply | Qty: 30 | Fill #1

## 2018-10-18 DIAGNOSIS — F419 Anxiety disorder, unspecified: Secondary | ICD-10-CM | POA: Diagnosis not present

## 2018-10-18 DIAGNOSIS — F329 Major depressive disorder, single episode, unspecified: Secondary | ICD-10-CM | POA: Diagnosis not present

## 2018-10-25 DIAGNOSIS — F419 Anxiety disorder, unspecified: Secondary | ICD-10-CM | POA: Diagnosis not present

## 2018-10-25 DIAGNOSIS — F329 Major depressive disorder, single episode, unspecified: Secondary | ICD-10-CM | POA: Diagnosis not present

## 2018-10-27 DIAGNOSIS — F4322 Adjustment disorder with anxiety: Secondary | ICD-10-CM | POA: Diagnosis not present

## 2018-10-31 MED FILL — LOSARTAN POTASSIUM 50 MG TA: 50 | 30 days supply | Qty: 30 | Fill #2

## 2018-11-24 DIAGNOSIS — F4322 Adjustment disorder with anxiety: Secondary | ICD-10-CM | POA: Diagnosis not present

## 2018-11-28 MED FILL — LOMAIRA 8 MG TABLET: 8 | 30 days supply | Qty: 60 | Fill #0

## 2018-12-05 MED FILL — LOSARTAN POTASSIUM 50 MG TA: 50 | 30 days supply | Qty: 30 | Fill #3

## 2018-12-09 MED FILL — buPROPion HCL ER (SR) 100 M: 100 | 90 days supply | Qty: 180 | Fill #0

## 2018-12-23 DIAGNOSIS — G4733 Obstructive sleep apnea (adult) (pediatric): Secondary | ICD-10-CM | POA: Diagnosis not present

## 2019-01-01 MED FILL — LOSARTAN POTASSIUM 50 MG TA: 50 | 30 days supply | Qty: 30 | Fill #4

## 2019-01-02 MED FILL — LOMAIRA 8 MG TABLET: 8 | 30 days supply | Qty: 60 | Fill #1

## 2019-01-11 DIAGNOSIS — L570 Actinic keratosis: Secondary | ICD-10-CM | POA: Diagnosis not present

## 2019-01-11 DIAGNOSIS — D2261 Melanocytic nevi of right upper limb, including shoulder: Secondary | ICD-10-CM | POA: Diagnosis not present

## 2019-01-11 DIAGNOSIS — B360 Pityriasis versicolor: Secondary | ICD-10-CM | POA: Diagnosis not present

## 2019-01-11 DIAGNOSIS — D225 Melanocytic nevi of trunk: Secondary | ICD-10-CM | POA: Diagnosis not present

## 2019-01-11 DIAGNOSIS — D2271 Melanocytic nevi of right lower limb, including hip: Secondary | ICD-10-CM | POA: Diagnosis not present

## 2019-01-11 DIAGNOSIS — D2272 Melanocytic nevi of left lower limb, including hip: Secondary | ICD-10-CM | POA: Diagnosis not present

## 2019-01-11 DIAGNOSIS — D2262 Melanocytic nevi of left upper limb, including shoulder: Secondary | ICD-10-CM | POA: Diagnosis not present

## 2019-01-11 DIAGNOSIS — D2372 Other benign neoplasm of skin of left lower limb, including hip: Secondary | ICD-10-CM | POA: Diagnosis not present

## 2019-01-11 DIAGNOSIS — D2371 Other benign neoplasm of skin of right lower limb, including hip: Secondary | ICD-10-CM | POA: Diagnosis not present

## 2019-01-17 MED FILL — SELENIUM SULF 2.5% LOTION: 2.5 | 7 days supply | Qty: 120 | Fill #0

## 2019-01-31 MED FILL — LOSARTAN POTASSIUM 50 MG TA: 50 | 30 days supply | Qty: 30 | Fill #5

## 2019-03-03 MED FILL — LOSARTAN POTASSIUM 50 MG TA: 50 | 30 days supply | Qty: 30 | Fill #6

## 2019-03-06 MED FILL — LOMAIRA 8 MG TABLET: 8 | 30 days supply | Qty: 60 | Fill #0

## 2019-03-20 DIAGNOSIS — G4733 Obstructive sleep apnea (adult) (pediatric): Secondary | ICD-10-CM | POA: Diagnosis not present

## 2019-03-23 DIAGNOSIS — F4322 Adjustment disorder with anxiety: Secondary | ICD-10-CM | POA: Diagnosis not present

## 2019-03-28 MED FILL — buPROPion HCL ER (SR) 100 M: 100 | 90 days supply | Qty: 180 | Fill #0

## 2019-03-30 MED FILL — LOSARTAN POTASSIUM 50 MG TA: 50 | 30 days supply | Qty: 30 | Fill #7

## 2019-04-27 DIAGNOSIS — F4322 Adjustment disorder with anxiety: Secondary | ICD-10-CM | POA: Diagnosis not present

## 2019-05-02 MED FILL — LOMAIRA 8 MG TABLET: 8 | 30 days supply | Qty: 60 | Fill #1

## 2019-05-02 MED FILL — LOSARTAN POTASSIUM 50 MG TA: 50 | 30 days supply | Qty: 30 | Fill #8

## 2019-05-25 DIAGNOSIS — F4322 Adjustment disorder with anxiety: Secondary | ICD-10-CM | POA: Diagnosis not present

## 2019-06-02 MED FILL — LOSARTAN POTASSIUM 50 MG TA: 50 | 30 days supply | Qty: 30 | Fill #9

## 2019-06-05 DIAGNOSIS — N952 Postmenopausal atrophic vaginitis: Secondary | ICD-10-CM | POA: Diagnosis not present

## 2019-06-05 DIAGNOSIS — Z01419 Encounter for gynecological examination (general) (routine) without abnormal findings: Secondary | ICD-10-CM | POA: Diagnosis not present

## 2019-06-05 DIAGNOSIS — Z6836 Body mass index (BMI) 36.0-36.9, adult: Secondary | ICD-10-CM | POA: Diagnosis not present

## 2019-06-05 DIAGNOSIS — N898 Other specified noninflammatory disorders of vagina: Secondary | ICD-10-CM | POA: Diagnosis not present

## 2019-06-05 MED FILL — PREMARIN VAGINAL CREAM-APPL: 0.625 | 60 days supply | Qty: 30 | Fill #0

## 2019-06-22 MED FILL — LOMAIRA 8 MG TABLET: 8 | 30 days supply | Qty: 60 | Fill #0

## 2019-06-27 MED FILL — buPROPion HCL ER (SR) 100 M: 100 | 90 days supply | Qty: 180 | Fill #0

## 2019-06-29 DIAGNOSIS — N952 Postmenopausal atrophic vaginitis: Secondary | ICD-10-CM | POA: Diagnosis not present

## 2019-06-29 DIAGNOSIS — N898 Other specified noninflammatory disorders of vagina: Secondary | ICD-10-CM | POA: Diagnosis not present

## 2019-07-01 DIAGNOSIS — Z23 Encounter for immunization: Secondary | ICD-10-CM | POA: Diagnosis not present

## 2019-07-06 ENCOUNTER — Other Ambulatory Visit: Payer: Self-pay | Admitting: Physician Assistant

## 2019-07-06 ENCOUNTER — Other Ambulatory Visit (HOSPITAL_COMMUNITY): Payer: Self-pay | Admitting: Physician Assistant

## 2019-07-06 DIAGNOSIS — D333 Benign neoplasm of cranial nerves: Secondary | ICD-10-CM

## 2019-07-06 MED FILL — LOSARTAN POTASSIUM 50 MG TA: 50 | 30 days supply | Qty: 30 | Fill #10

## 2019-07-12 ENCOUNTER — Ambulatory Visit (HOSPITAL_COMMUNITY)
Admission: RE | Admit: 2019-07-12 | Discharge: 2019-07-12 | Disposition: A | Discharge status: Home / Self Care | Payer: 59 | Source: Ambulatory Visit | Attending: Physician Assistant | Admitting: Physician Assistant

## 2019-07-12 ENCOUNTER — Other Ambulatory Visit: Payer: Self-pay

## 2019-07-12 ENCOUNTER — Ambulatory Visit (HOSPITAL_COMMUNITY): Payer: 59

## 2019-07-12 DIAGNOSIS — D333 Benign neoplasm of cranial nerves: Secondary | ICD-10-CM

## 2019-07-12 MED FILL — predniSONE 50 MG TABS: 50 | 1 days supply | Qty: 3 | Fill #0

## 2019-07-13 ENCOUNTER — Ambulatory Visit (HOSPITAL_COMMUNITY)
Admission: RE | Admit: 2019-07-13 | Discharge: 2019-07-13 | Disposition: A | Discharge status: Home / Self Care | Payer: 59 | Source: Ambulatory Visit | Attending: Physician Assistant | Admitting: Physician Assistant

## 2019-07-13 ENCOUNTER — Other Ambulatory Visit: Payer: Self-pay

## 2019-07-13 DIAGNOSIS — D333 Benign neoplasm of cranial nerves: Secondary | ICD-10-CM | POA: Diagnosis not present

## 2019-07-13 LAB — POCT I-STAT CREATININE: Creatinine, Ser: 0.6 mg/dL (ref 0.44–1.00)

## 2019-07-13 MED ORDER — GADOBUTROL 1 MMOL/ML IV SOLN
10.0000 mL | Freq: Once | INTRAVENOUS | Status: AC | PRN
  Administered 2019-07-13: 08:00:00 10 mL via INTRAVENOUS

## 2019-07-18 DIAGNOSIS — D333 Benign neoplasm of cranial nerves: Secondary | ICD-10-CM | POA: Diagnosis not present

## 2019-07-27 DIAGNOSIS — F4322 Adjustment disorder with anxiety: Secondary | ICD-10-CM | POA: Diagnosis not present

## 2019-08-01 MED FILL — LOMAIRA 8 MG TABLET: 8 | 30 days supply | Qty: 60 | Fill #1

## 2019-08-06 MED FILL — LOSARTAN POTASSIUM 50 MG TA: 50 | 30 days supply | Qty: 30 | Fill #11

## 2019-08-07 MED FILL — PREMARIN VAGINAL CREAM-APPL: 0.625 | 90 days supply | Qty: 30 | Fill #1

## 2019-08-09 DIAGNOSIS — Z20828 Contact with and (suspected) exposure to other viral communicable diseases: Secondary | ICD-10-CM | POA: Diagnosis not present

## 2019-08-24 DIAGNOSIS — F4322 Adjustment disorder with anxiety: Secondary | ICD-10-CM | POA: Diagnosis not present

## 2019-08-28 DIAGNOSIS — G4733 Obstructive sleep apnea (adult) (pediatric): Secondary | ICD-10-CM | POA: Diagnosis not present

## 2019-09-04 MED FILL — LOSARTAN POTASSIUM 50 MG TA: 50 | 90 days supply | Qty: 90 | Fill #0

## 2019-09-12 DIAGNOSIS — E042 Nontoxic multinodular goiter: Secondary | ICD-10-CM | POA: Diagnosis not present

## 2019-09-12 DIAGNOSIS — I10 Essential (primary) hypertension: Secondary | ICD-10-CM | POA: Diagnosis not present

## 2019-09-18 DIAGNOSIS — H524 Presbyopia: Secondary | ICD-10-CM | POA: Diagnosis not present

## 2019-09-18 MED FILL — LOMAIRA 8 MG TABLET: 8 | 30 days supply | Qty: 60 | Fill #2

## 2019-09-20 DIAGNOSIS — K921 Melena: Secondary | ICD-10-CM | POA: Diagnosis not present

## 2019-09-20 DIAGNOSIS — I10 Essential (primary) hypertension: Secondary | ICD-10-CM | POA: Diagnosis not present

## 2019-09-20 DIAGNOSIS — G4733 Obstructive sleep apnea (adult) (pediatric): Secondary | ICD-10-CM | POA: Diagnosis not present

## 2019-09-26 DIAGNOSIS — G4733 Obstructive sleep apnea (adult) (pediatric): Secondary | ICD-10-CM | POA: Diagnosis not present

## 2019-09-26 DIAGNOSIS — Z0000 Encounter for general adult medical examination without abnormal findings: Secondary | ICD-10-CM | POA: Diagnosis not present

## 2019-09-26 DIAGNOSIS — G4700 Insomnia, unspecified: Secondary | ICD-10-CM | POA: Diagnosis not present

## 2019-09-26 DIAGNOSIS — R195 Other fecal abnormalities: Secondary | ICD-10-CM | POA: Diagnosis not present

## 2019-09-26 DIAGNOSIS — E042 Nontoxic multinodular goiter: Secondary | ICD-10-CM | POA: Diagnosis not present

## 2019-09-26 DIAGNOSIS — R5383 Other fatigue: Secondary | ICD-10-CM | POA: Diagnosis not present

## 2019-09-26 DIAGNOSIS — E669 Obesity, unspecified: Secondary | ICD-10-CM | POA: Diagnosis not present

## 2019-09-26 DIAGNOSIS — R3121 Asymptomatic microscopic hematuria: Secondary | ICD-10-CM | POA: Diagnosis not present

## 2019-09-26 DIAGNOSIS — I10 Essential (primary) hypertension: Secondary | ICD-10-CM | POA: Diagnosis not present

## 2019-09-26 DIAGNOSIS — Z1331 Encounter for screening for depression: Secondary | ICD-10-CM | POA: Diagnosis not present

## 2019-09-26 DIAGNOSIS — Z1339 Encounter for screening examination for other mental health and behavioral disorders: Secondary | ICD-10-CM | POA: Diagnosis not present

## 2019-09-26 MED FILL — buPROPion HCL ER (SR) 100 M: 100 | 90 days supply | Qty: 180 | Fill #0

## 2019-09-28 DIAGNOSIS — F4322 Adjustment disorder with anxiety: Secondary | ICD-10-CM | POA: Diagnosis not present

## 2019-10-02 ENCOUNTER — Encounter (HOSPITAL_COMMUNITY): Payer: Self-pay | Admitting: Emergency Medicine

## 2019-10-02 ENCOUNTER — Emergency Department (HOSPITAL_COMMUNITY)
Admission: EM | Admit: 2019-10-02 | Discharge: 2019-10-02 | Disposition: A | Discharge status: Home / Self Care | Payer: 59 | Attending: Emergency Medicine | Admitting: Emergency Medicine

## 2019-10-02 ENCOUNTER — Other Ambulatory Visit: Payer: Self-pay

## 2019-10-02 DIAGNOSIS — Z7982 Long term (current) use of aspirin: Secondary | ICD-10-CM | POA: Diagnosis not present

## 2019-10-02 DIAGNOSIS — Z79899 Other long term (current) drug therapy: Secondary | ICD-10-CM | POA: Insufficient documentation

## 2019-10-02 DIAGNOSIS — R61 Generalized hyperhidrosis: Secondary | ICD-10-CM | POA: Insufficient documentation

## 2019-10-02 DIAGNOSIS — R42 Dizziness and giddiness: Secondary | ICD-10-CM | POA: Diagnosis not present

## 2019-10-02 DIAGNOSIS — I10 Essential (primary) hypertension: Secondary | ICD-10-CM | POA: Insufficient documentation

## 2019-10-02 DIAGNOSIS — Z86718 Personal history of other venous thrombosis and embolism: Secondary | ICD-10-CM | POA: Diagnosis not present

## 2019-10-02 LAB — COMPREHENSIVE METABOLIC PANEL
ALT: 19 U/L (ref 0–44)
AST: 17 U/L (ref 15–41)
Albumin: 3.7 g/dL (ref 3.5–5.0)
Alkaline Phosphatase: 61 U/L (ref 38–126)
Anion gap: 9 (ref 5–15)
BUN: 17 mg/dL (ref 8–23)
CO2: 25 mmol/L (ref 22–32)
Calcium: 9.3 mg/dL (ref 8.9–10.3)
Chloride: 98 mmol/L (ref 98–111)
Creatinine, Ser: 0.73 mg/dL (ref 0.44–1.00)
GFR calc Af Amer: >60 mL/min (ref >60)
GFR calc non Af Amer: >60 mL/min (ref >60)
Glucose, Bld: 101 mg/dL — ABNORMAL HIGH (ref 70–99)
Potassium: 4.2 mmol/L (ref 3.5–5.1)
Sodium: 132 mmol/L — ABNORMAL LOW (ref 135–145)
Total Bilirubin: 1.2 mg/dL (ref 0.3–1.2)
Total Protein: 6.2 g/dL — ABNORMAL LOW (ref 6.5–8.1)

## 2019-10-02 LAB — CBC WITH DIFFERENTIAL/PLATELET
Abs Immature Granulocytes: 0.01 10*3/uL (ref 0.00–0.07)
Basophils Absolute: 0.1 10*3/uL (ref 0.0–0.1)
Basophils Relative: 1 %
Eosinophils Absolute: 0.1 10*3/uL (ref 0.0–0.5)
Eosinophils Relative: 2 %
HCT: 39.9 % (ref 36.0–46.0)
Hemoglobin: 13.3 g/dL (ref 12.0–15.0)
Immature Granulocytes: 0 %
Lymphocytes Relative: 30 %
Lymphs Abs: 1.6 10*3/uL (ref 0.7–4.0)
MCH: 31.5 pg (ref 26.0–34.0)
MCHC: 33.3 g/dL (ref 30.0–36.0)
MCV: 94.5 fL (ref 80.0–100.0)
Monocytes Absolute: 0.5 10*3/uL (ref 0.1–1.0)
Monocytes Relative: 9 %
Neutro Abs: 3 10*3/uL (ref 1.7–7.7)
Neutrophils Relative %: 58 %
Platelets: 209 10*3/uL (ref 150–400)
RBC: 4.22 MIL/uL (ref 3.87–5.11)
RDW: 11.8 % (ref 11.5–15.5)
WBC: 5.1 10*3/uL (ref 4.0–10.5)
nRBC: 0 % (ref 0.0–0.2)

## 2019-10-02 LAB — CBG MONITORING, ED: Glucose-Capillary: 89 mg/dL (ref 70–99)

## 2019-10-02 NOTE — Discharge Instructions (Addendum)
Labs and EKG were normal.  Rest.  Increase fluids.  Return if worse or follow-up with your primary care doctor.

## 2019-10-02 NOTE — ED Provider Notes (Addendum)
[place] EMERGENCY DEPARTMENT Provider Note   CSN: [id number] Arrival date & time: 10/02/19  1101     History No chief complaint on file.   Melissa Morrow is a 64 y.o. female.  Chief complaint dizziness and diaphoresis.  Patient is an Therapist, sports at [employer].  She was standing in the hallway approximately 1 hour ago when she became abruptly dizzy and diaphoretic.  No syncope, chest pain, gross neurological deficits, prodromal illnesses.  No history of diabetes.  She ate breakfast this morning and has consumed fluids.  She feels back to normal now.  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        Past Medical History:  Diagnosis Date  . DVT (deep venous thrombosis) ([place])    Confirm location with patient.  . Hypertension    Mild  . Obesity     Patient Active Problem List   Diagnosis Date Noted  . High blood pressure 03/11/2011  . Hearing loss 03/11/2011  . Contact lens/glasses fitting 03/11/2011  . Menopause 03/11/2011  . Family history of breast cancer 03/11/2011    Past Surgical History:  Procedure Laterality Date  . APPENDECTOMY  1971  . 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 BUNIONECTOMY  [id number] and 1996  . [place]  . GALLBLADDER SURGERY  2004  . Placement of Greenfield Filter  1988     OB History   No obstetric history on file.     Family History  Problem Relation Age of Onset  . Heart disease Father        Acute MI  . Breast cancer Maternal Grandmother     Social History   Tobacco Use  . Smoking status: Never Smoker  . Smokeless tobacco: Never Used  Substance Use Topics  . Alcohol use: Yes    Comment: 1-2 glasses of wine per week.  . Drug use: Not on file    Home Medications Prior to Admission medications   Medication Sig Start Date End Date Taking? Authorizing Provider  aspirin 81 MG tablet Take 81 mg by mouth daily.      [provider]    calcium carbonate (TUMS - DOSED IN MG ELEMENTAL CALCIUM) 500 MG chewable tablet Chew 1 tablet by mouth daily. Verify type with patient. Only noted "calcium" on history form dated 01/21/11.     [provider]  Guaifenesin 1200 MG TB12 Take 1 tablet (1,200 mg total) by mouth 2 (two) times daily. 09/20/16   Lawyer, Harrell Gave, PA-C  lisinopril-hydrochlorothiazide (PRINZIDE,ZESTORETIC) 10-12.5 MG per tablet Take 1 tablet by mouth daily.      [provider]  Multiple Vitamin (MULTIVITAMIN) tablet Take 1 tablet by mouth daily.      [provider]  predniSONE (DELTASONE) 50 MG tablet Take 1 tablet (50 mg total) by mouth daily with breakfast. 09/20/16   Lawyer, Harrell Gave, PA-C  promethazine-dextromethorphan (PROMETHAZINE-DM) 6.25-15 MG/5ML syrup Take 5 mLs by mouth 4 (four) times daily as needed for cough. 09/20/16   Lawyer, Harrell Gave, PA-C    Allergies    Gadolinium derivatives and Ace inhibitors  Review of Systems   Review of Systems  All other systems reviewed and are negative.   Physical Exam Updated Vital Signs BP 117/68   Pulse 69   Resp 20   SpO2 100%   Physical Exam Vitals and nursing note reviewed.  Constitutional:  Appearance: She is well-developed.  HENT:     Head: Normocephalic and atraumatic.  Eyes:     Conjunctiva/sclera: Conjunctivae normal.  Cardiovascular:     Rate and Rhythm: Normal rate and regular rhythm.  Pulmonary:     Effort: Pulmonary effort is normal.     Breath sounds: Normal breath sounds.  Abdominal:     General: Bowel sounds are normal.     Palpations: Abdomen is soft.  Musculoskeletal:        General: Normal range of motion.     Cervical back: Neck supple.  Skin:    General: Skin is warm and dry.  Neurological:     General: No focal deficit present.     Mental Status: She is alert and oriented to person, place, and time.  Psychiatric:        Behavior: Behavior normal.     ED Results / Procedures /  Treatments   Labs (all labs ordered are listed, but only abnormal results are displayed) Labs Reviewed  COMPREHENSIVE METABOLIC PANEL - Abnormal; Notable for the following components:      Result Value   Sodium 132 (*)    Glucose, Bld 101 (*)    Total Protein 6.2 (*)    All other components within normal limits  CBC WITH DIFFERENTIAL/PLATELET  CBG MONITORING, ED    EKG EKG Interpretation  Date/Time:  Monday October 02 2019 11:47:24 EST Ventricular Rate:  61 PR Interval:    QRS Duration: 108 QT Interval:  420 QTC Calculation: 423 R Axis:   86 Text Interpretation: Sinus rhythm Borderline right axis deviation Confirmed by Nat Christen [phone]) on 10/02/2019 12:39:42 PM   Radiology No results found.  Procedures Procedures (including critical care time)  Medications Ordered in ED Medications - No data to display  ED Course  I have reviewed the triage vital signs and the nursing notes.  Pertinent labs & imaging results that were available during my care of the patient were reviewed by me and considered in my medical decision making (see chart for details).    MDM Rules/Calculators/A&P                      Patient has normal physical exam in the ED.  CBG acceptable.  Discussed CT head, but decided against that with shared decision making.  Labs pending, observation.  1245: Recheck.  Normal neurological exam.  No chest pain or dyspnea.  Discussed laboratory tests.  Stable for outpatient management. Final Clinical Impression(s) / ED Diagnoses Final diagnoses:  Dizziness  Diaphoresis    Rx / DC Orders ED Discharge Orders    None       Nat Christen, MD 10/02/19 1153    Nat Christen, MD 10/02/19 1254

## 2019-10-02 NOTE — ED Triage Notes (Signed)
Pt was here at work and developed some dizziness and very diaphoretic ,no chest pain or sob ,

## 2019-10-02 NOTE — ED Notes (Signed)
Discharge instructions discussed with Pt. Pt verbalized understanding. Pt stable and ambulatory.    

## 2019-10-26 DIAGNOSIS — F4322 Adjustment disorder with anxiety: Secondary | ICD-10-CM | POA: Diagnosis not present

## 2019-11-09 ENCOUNTER — Other Ambulatory Visit: Payer: Self-pay | Admitting: Physician Assistant

## 2019-11-09 DIAGNOSIS — D333 Benign neoplasm of cranial nerves: Secondary | ICD-10-CM

## 2019-11-20 DIAGNOSIS — G4733 Obstructive sleep apnea (adult) (pediatric): Secondary | ICD-10-CM | POA: Diagnosis not present

## 2019-12-04 MED FILL — LOSARTAN POTASSIUM 50 MG TA: 50 | 90 days supply | Qty: 90 | Fill #0

## 2020-01-08 MED FILL — buPROPion HCL ER (SR) 100 M: 100 | 90 days supply | Qty: 180 | Fill #0

## 2020-01-16 ENCOUNTER — Other Ambulatory Visit: Payer: Self-pay | Admitting: Obstetrics

## 2020-01-16 DIAGNOSIS — Z1231 Encounter for screening mammogram for malignant neoplasm of breast: Secondary | ICD-10-CM

## 2020-01-19 ENCOUNTER — Ambulatory Visit
Admission: RE | Admit: 2020-01-19 | Discharge: 2020-01-19 | Disposition: A | Discharge status: Home / Self Care | Payer: 59 | Source: Ambulatory Visit | Attending: Obstetrics | Admitting: Obstetrics

## 2020-01-19 ENCOUNTER — Other Ambulatory Visit: Payer: Self-pay

## 2020-01-19 DIAGNOSIS — Z1231 Encounter for screening mammogram for malignant neoplasm of breast: Secondary | ICD-10-CM

## 2020-01-23 MED FILL — LOMAIRA 8 MG TABLET: 8 | 30 days supply | Qty: 30 | Fill #0

## 2020-02-13 DIAGNOSIS — Z6833 Body mass index (BMI) 33.0-33.9, adult: Secondary | ICD-10-CM | POA: Diagnosis not present

## 2020-02-13 DIAGNOSIS — R635 Abnormal weight gain: Secondary | ICD-10-CM | POA: Diagnosis not present

## 2020-02-13 DIAGNOSIS — I10 Essential (primary) hypertension: Secondary | ICD-10-CM | POA: Diagnosis not present

## 2020-02-19 DIAGNOSIS — G4733 Obstructive sleep apnea (adult) (pediatric): Secondary | ICD-10-CM | POA: Diagnosis not present

## 2020-02-26 ENCOUNTER — Other Ambulatory Visit (HOSPITAL_COMMUNITY): Payer: Self-pay | Admitting: Internal Medicine

## 2020-02-28 DIAGNOSIS — D2262 Melanocytic nevi of left upper limb, including shoulder: Secondary | ICD-10-CM | POA: Diagnosis not present

## 2020-02-28 DIAGNOSIS — D2272 Melanocytic nevi of left lower limb, including hip: Secondary | ICD-10-CM | POA: Diagnosis not present

## 2020-02-28 DIAGNOSIS — D1801 Hemangioma of skin and subcutaneous tissue: Secondary | ICD-10-CM | POA: Diagnosis not present

## 2020-02-28 DIAGNOSIS — L821 Other seborrheic keratosis: Secondary | ICD-10-CM | POA: Diagnosis not present

## 2020-02-28 DIAGNOSIS — D2372 Other benign neoplasm of skin of left lower limb, including hip: Secondary | ICD-10-CM | POA: Diagnosis not present

## 2020-02-28 DIAGNOSIS — D225 Melanocytic nevi of trunk: Secondary | ICD-10-CM | POA: Diagnosis not present

## 2020-03-18 ENCOUNTER — Other Ambulatory Visit (HOSPITAL_COMMUNITY): Payer: Self-pay | Admitting: Physician Assistant

## 2020-03-18 DIAGNOSIS — Z6832 Body mass index (BMI) 32.0-32.9, adult: Secondary | ICD-10-CM | POA: Diagnosis not present

## 2020-03-18 DIAGNOSIS — I10 Essential (primary) hypertension: Secondary | ICD-10-CM | POA: Diagnosis not present

## 2020-03-18 DIAGNOSIS — F329 Major depressive disorder, single episode, unspecified: Secondary | ICD-10-CM | POA: Diagnosis not present

## 2020-03-18 DIAGNOSIS — E669 Obesity, unspecified: Secondary | ICD-10-CM | POA: Diagnosis not present

## 2020-03-18 DIAGNOSIS — Z713 Dietary counseling and surveillance: Secondary | ICD-10-CM | POA: Diagnosis not present

## 2020-03-27 MED FILL — LOMAIRA 8 MG TABLET: 8 | 30 days supply | Qty: 30 | Fill #2

## 2020-03-31 IMAGING — MG DIGITAL SCREENING BILATERAL MAMMOGRAM WITH TOMO AND CAD
8 series · 8 of 24 positions shown · non-contrast
Comparison: Previous exam(s).

CLINICAL DATA: Screening.

EXAM:
DIGITAL SCREENING BILATERAL MAMMOGRAM WITH TOMO AND CAD

[R CC synth-2D]
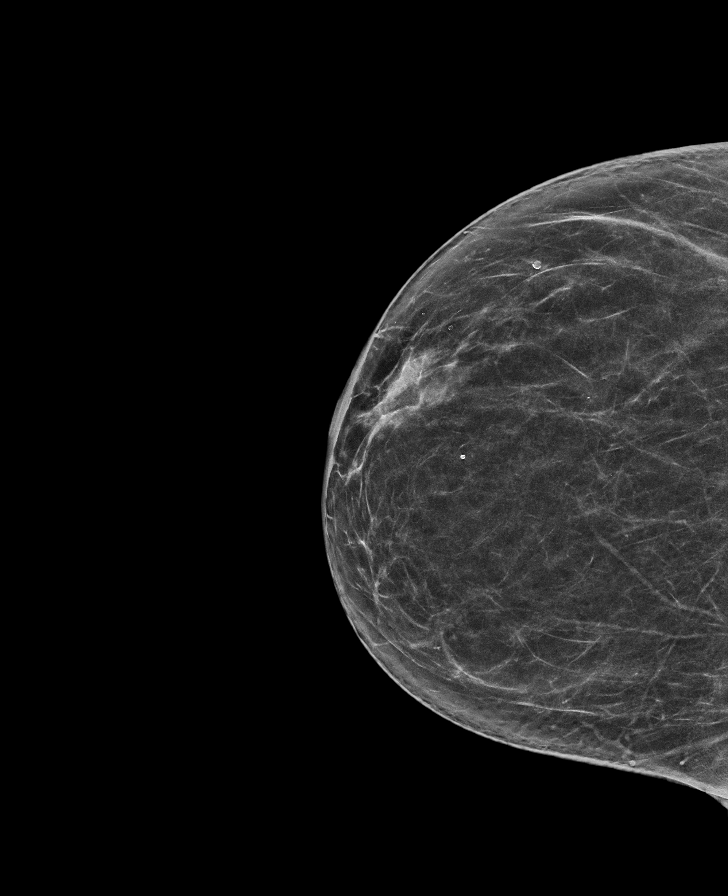

[L CC synth-2D]
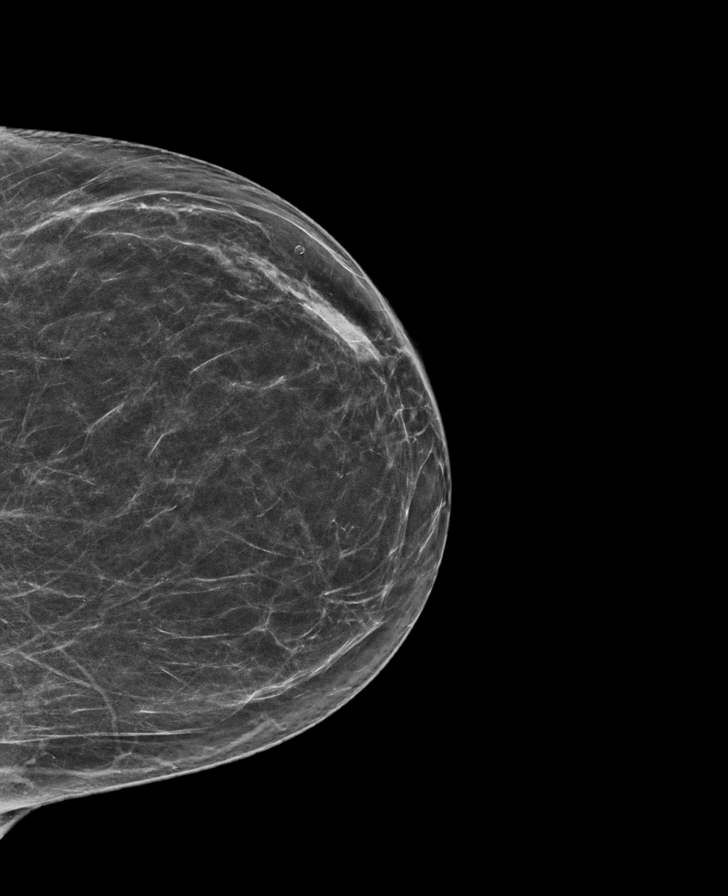

[L MLO synth-2D]
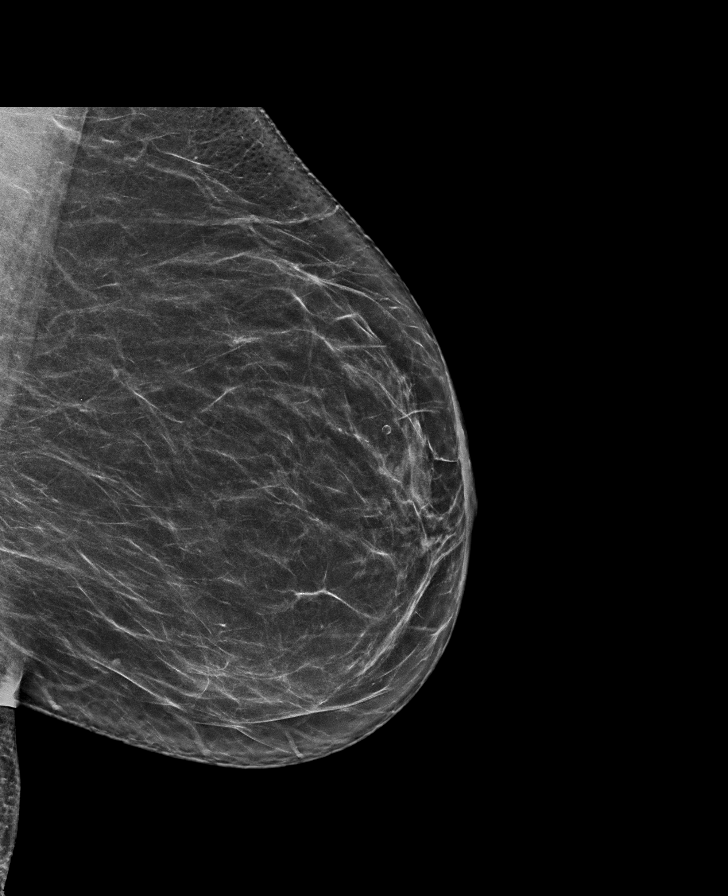

[R MLO synth-2D]
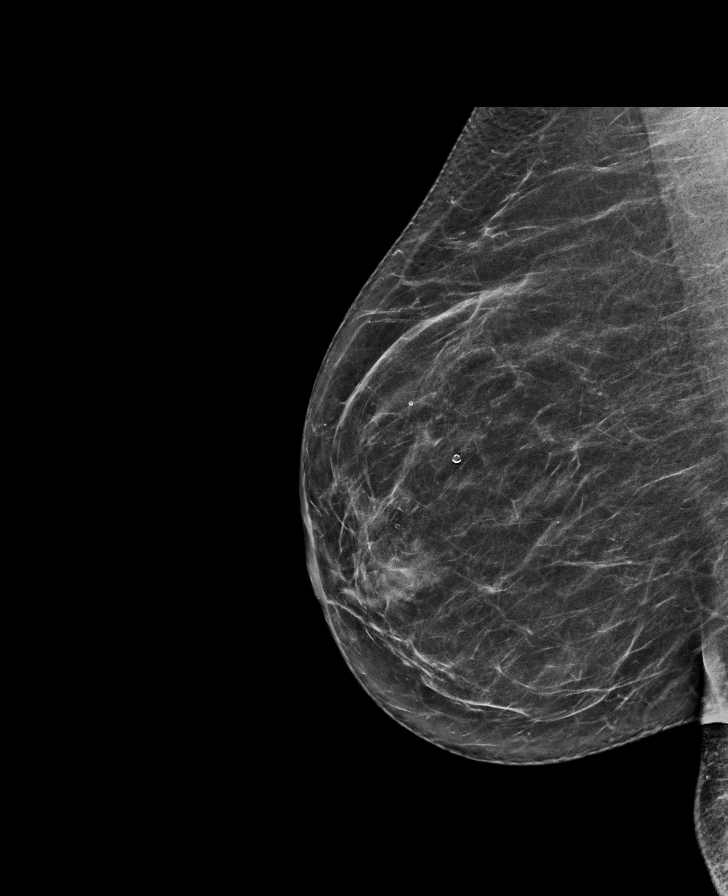

[R MLO tomo · tomo slice 35/70.0]
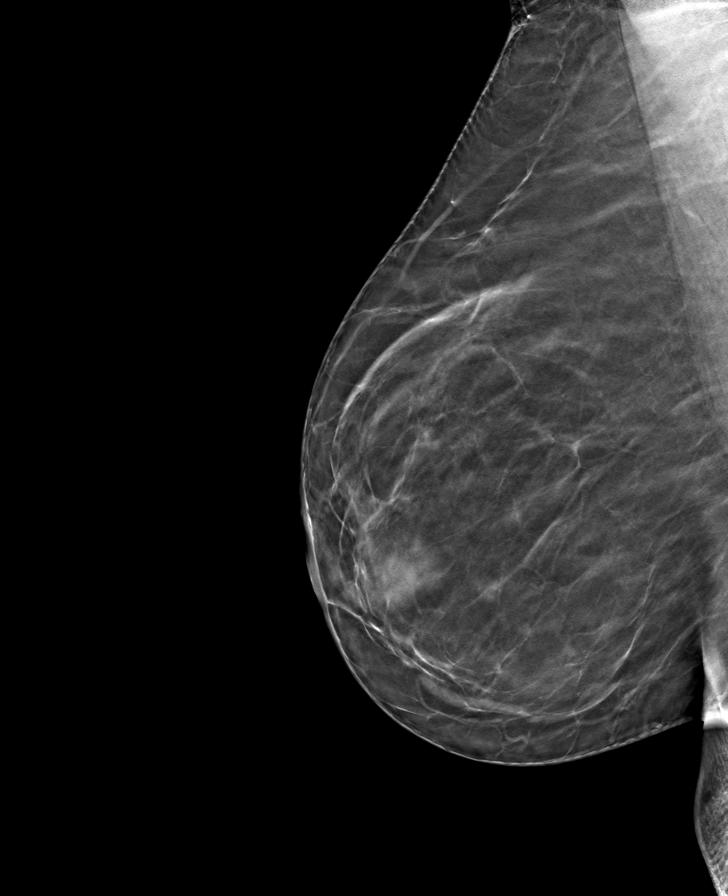

[R CC tomo · tomo slice 33/66.0]
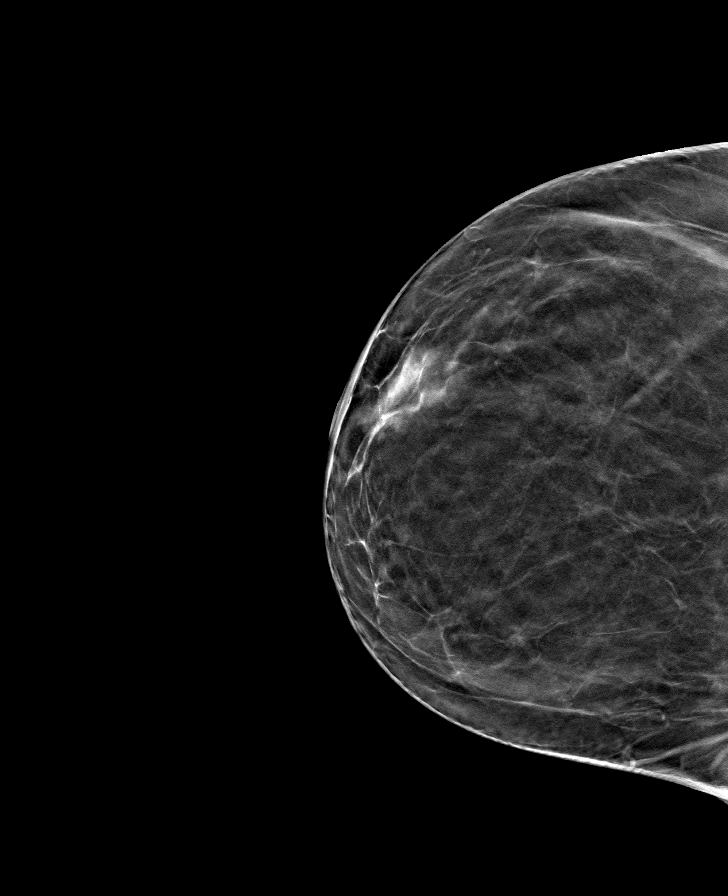

[L CC tomo · tomo slice 34/67.0]
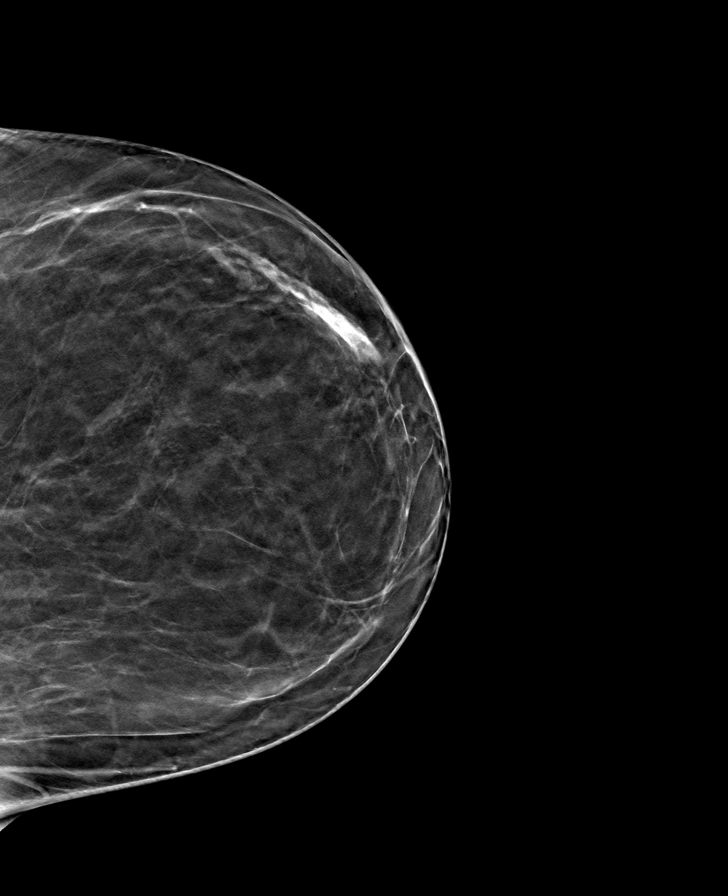

[L MLO tomo · tomo slice 37/73.0]
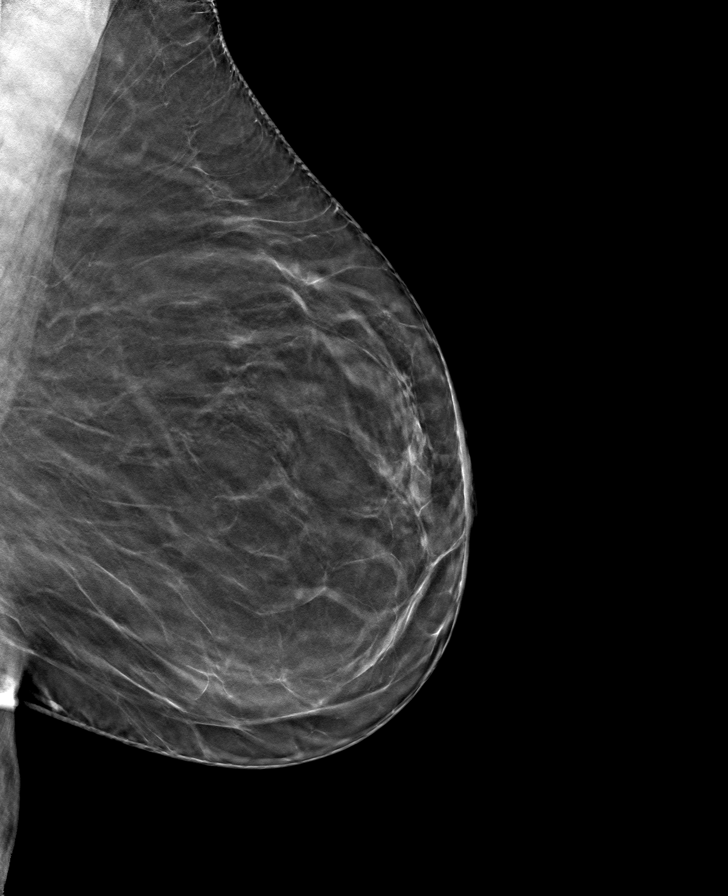

[8 of 24 positions shown; findings below may reference images not displayed]

ACR Breast Density Category b: There are scattered areas of
fibroglandular density.
FINDINGS: There are no findings suspicious for malignancy. Images were
processed with CAD.
IMPRESSION: No mammographic evidence of malignancy. A result letter of this
screening mammogram will be mailed directly to the patient.

RECOMMENDATION:
Screening mammogram in one year. (Code:[id number])

BI-RADS CATEGORY  1: Negative.

## 2020-04-08 DIAGNOSIS — F419 Anxiety disorder, unspecified: Secondary | ICD-10-CM | POA: Diagnosis not present

## 2020-04-08 DIAGNOSIS — Z6832 Body mass index (BMI) 32.0-32.9, adult: Secondary | ICD-10-CM | POA: Diagnosis not present

## 2020-04-08 DIAGNOSIS — Z713 Dietary counseling and surveillance: Secondary | ICD-10-CM | POA: Diagnosis not present

## 2020-04-08 DIAGNOSIS — I10 Essential (primary) hypertension: Secondary | ICD-10-CM | POA: Diagnosis not present

## 2020-04-09 MED FILL — buPROPion HCL ER (SR) 100 M: 100 | 90 days supply | Qty: 180 | Fill #0

## 2020-04-25 MED FILL — LOMAIRA 8 MG TABLET: 8 | 30 days supply | Qty: 30 | Fill #0

## 2020-05-13 DIAGNOSIS — G4733 Obstructive sleep apnea (adult) (pediatric): Secondary | ICD-10-CM | POA: Diagnosis not present

## 2020-06-07 MED FILL — LOSARTAN POTASSIUM 50 MG TA: 50 | 90 days supply | Qty: 90 | Fill #1

## 2020-06-08 DIAGNOSIS — Z23 Encounter for immunization: Secondary | ICD-10-CM | POA: Diagnosis not present

## 2020-06-26 DIAGNOSIS — F419 Anxiety disorder, unspecified: Secondary | ICD-10-CM | POA: Diagnosis not present

## 2020-06-26 DIAGNOSIS — I10 Essential (primary) hypertension: Secondary | ICD-10-CM | POA: Diagnosis not present

## 2020-06-26 DIAGNOSIS — Z713 Dietary counseling and surveillance: Secondary | ICD-10-CM | POA: Diagnosis not present

## 2020-06-26 DIAGNOSIS — Z6832 Body mass index (BMI) 32.0-32.9, adult: Secondary | ICD-10-CM | POA: Diagnosis not present

## 2020-06-26 DIAGNOSIS — E669 Obesity, unspecified: Secondary | ICD-10-CM | POA: Diagnosis not present

## 2020-06-29 DIAGNOSIS — Z23 Encounter for immunization: Secondary | ICD-10-CM | POA: Diagnosis not present

## 2020-07-01 ENCOUNTER — Other Ambulatory Visit (HOSPITAL_COMMUNITY): Payer: Self-pay | Admitting: Internal Medicine

## 2020-07-01 MED FILL — PNEUMOVAX 23 SYRINGE: 25 | 1 days supply | Qty: 1 | Fill #0

## 2020-07-03 MED FILL — LOMAIRA 8 MG TABLET: 8 | 30 days supply | Qty: 30 | Fill #1

## 2020-07-08 MED FILL — BUPROPION HCL SR 100 MG TAB: 100 | 90 days supply | Qty: 180 | Fill #1

## 2020-08-09 MED FILL — LOMAIRA 8 MG TABLET: 8 | 30 days supply | Qty: 30 | Fill #2

## 2020-08-29 ENCOUNTER — Other Ambulatory Visit (HOSPITAL_COMMUNITY): Payer: Self-pay | Admitting: Physician Assistant

## 2020-09-11 MED FILL — LOSARTAN POTASSIUM 50 MG TA: 50 | 90 days supply | Qty: 90 | Fill #2

## 2020-09-11 MED FILL — LOMAIRA 8 MG TABLET: 8 | 90 days supply | Qty: 90 | Fill #0

## 2020-09-17 DIAGNOSIS — G4733 Obstructive sleep apnea (adult) (pediatric): Secondary | ICD-10-CM | POA: Diagnosis not present

## 2020-09-19 DIAGNOSIS — G4733 Obstructive sleep apnea (adult) (pediatric): Secondary | ICD-10-CM | POA: Diagnosis not present

## 2020-09-19 DIAGNOSIS — H524 Presbyopia: Secondary | ICD-10-CM | POA: Diagnosis not present

## 2020-09-24 DIAGNOSIS — I10 Essential (primary) hypertension: Secondary | ICD-10-CM | POA: Diagnosis not present

## 2020-09-24 DIAGNOSIS — Z0000 Encounter for general adult medical examination without abnormal findings: Secondary | ICD-10-CM | POA: Diagnosis not present

## 2020-09-24 DIAGNOSIS — E042 Nontoxic multinodular goiter: Secondary | ICD-10-CM | POA: Diagnosis not present

## 2020-09-27 DIAGNOSIS — E669 Obesity, unspecified: Secondary | ICD-10-CM | POA: Diagnosis not present

## 2020-09-27 DIAGNOSIS — G4700 Insomnia, unspecified: Secondary | ICD-10-CM | POA: Diagnosis not present

## 2020-09-27 DIAGNOSIS — R55 Syncope and collapse: Secondary | ICD-10-CM | POA: Diagnosis not present

## 2020-09-27 DIAGNOSIS — R82998 Other abnormal findings in urine: Secondary | ICD-10-CM | POA: Diagnosis not present

## 2020-09-27 DIAGNOSIS — I10 Essential (primary) hypertension: Secondary | ICD-10-CM | POA: Diagnosis not present

## 2020-09-27 DIAGNOSIS — Z86718 Personal history of other venous thrombosis and embolism: Secondary | ICD-10-CM | POA: Diagnosis not present

## 2020-09-27 DIAGNOSIS — R3121 Asymptomatic microscopic hematuria: Secondary | ICD-10-CM | POA: Diagnosis not present

## 2020-09-27 DIAGNOSIS — Z0000 Encounter for general adult medical examination without abnormal findings: Secondary | ICD-10-CM | POA: Diagnosis not present

## 2020-09-27 DIAGNOSIS — Z1331 Encounter for screening for depression: Secondary | ICD-10-CM | POA: Diagnosis not present

## 2020-09-27 DIAGNOSIS — G4733 Obstructive sleep apnea (adult) (pediatric): Secondary | ICD-10-CM | POA: Diagnosis not present

## 2020-09-27 DIAGNOSIS — Z1339 Encounter for screening examination for other mental health and behavioral disorders: Secondary | ICD-10-CM | POA: Diagnosis not present

## 2020-09-27 DIAGNOSIS — M19049 Primary osteoarthritis, unspecified hand: Secondary | ICD-10-CM | POA: Diagnosis not present

## 2020-10-02 ENCOUNTER — Other Ambulatory Visit: Payer: Self-pay | Admitting: Internal Medicine

## 2020-10-02 DIAGNOSIS — Z8249 Family history of ischemic heart disease and other diseases of the circulatory system: Secondary | ICD-10-CM

## 2020-10-10 ENCOUNTER — Other Ambulatory Visit (HOSPITAL_COMMUNITY): Payer: Self-pay | Admitting: Internal Medicine

## 2020-10-10 MED FILL — HYDROCODONE-CHLORPHEN ER SU: 10-8 | 15 days supply | Qty: 150 | Fill #0

## 2020-10-10 MED FILL — BENZONATATE 100 MG CAPS: 100 | 30 days supply | Qty: 90 | Fill #0

## 2020-10-11 ENCOUNTER — Other Ambulatory Visit (HOSPITAL_COMMUNITY): Payer: Self-pay | Admitting: Infectious Diseases

## 2020-10-11 ENCOUNTER — Telehealth: Payer: Self-pay | Admitting: Infectious Diseases

## 2020-10-11 ENCOUNTER — Telehealth (HOSPITAL_COMMUNITY): Payer: Self-pay

## 2020-10-11 MED ORDER — NIRMATRELVIR/RITONAVIR (PAXLOVID)TABLET: 3.0000 | ORAL_TABLET | Freq: Two times a day (BID) | ORAL | 0 refills | Status: AC

## 2020-10-11 MED FILL — PAXLOVID 20 X 150 MG & 10 X: 20 X 150 MG | 5 days supply | Qty: 30 | Fill #0

## 2020-10-11 NOTE — Addendum Note (Signed)
Addended by: Almena Callas on: 10/11/2020 02:31 PM   Modules accepted: Orders

## 2020-10-11 NOTE — Telephone Encounter (Addendum)
Called to discuss with patient about COVID-19 symptoms and the use of one of the available treatments for those with mild to moderate Covid symptoms and at a high risk of hospitalization.  Pt appears to qualify for outpatient treatment due to co-morbid conditions and/or a member of an at-risk group in accordance with the FDA Emergency Use Authorization.     Symptom onset: unknown Vaccinated: yes  Booster? Yes September  Immunocompromised?  Qualifiers: BMI > 68, age > 79 yo  Unable to reach pt - LVM and mychart to callback    [employer]

## 2020-10-11 NOTE — Telephone Encounter (Signed)
Outpatient Oral COVID Treatment Note  I connected with Melissa Morrow on 10/11/2020/2:27 PM by telephone and verified that I am speaking with the correct person using two identifiers.  I discussed the limitations, risks, security, and privacy concerns of performing an evaluation and management service by telephone and the availability of in person appointments. I also discussed with the patient that there may be a patient responsible charge related to this service. The patient expressed understanding and agreed to proceed.  Patient location: [place] residence  Provider location: [facility] Office   Diagnosis: COVID-19 infection  Purpose of visit: Discussion of potential use of Molnupiravir or Paxlovid, a new treatment for mild to moderate COVID-19 viral infection in non-hospitalized patients.   Subjective: Patient is a 65 y.o. female who has been diagnosed with COVID 19 viral infection.  Their symptoms began on 10/07/2020 with URI/sinusitus symptoms.    Past Medical History:  Diagnosis Date  . DVT (deep venous thrombosis) ([place])    Confirm location with patient.  . Hypertension    Mild  . Obesity     Allergies  Allergen Reactions  . Gadolinium Derivatives Nausea And Vomiting    Pt began immed vomiting post imj of multihance  . Ace Inhibitors Other (See Comments)    Angioedema      Current Outpatient Medications:  .  buPROPion (WELLBUTRIN SR) 100 MG 12 hr tablet, Take 1 tablet by mouth 2 (two) times daily., Disp: , Rfl:  .  nirmatrelvir/ritonavir EUA (PAXLOVID) TABS, Take 3 tablets by mouth 2 (two) times daily for 5 days. Take nirmatrelvir (150 mg) 3 tablet(s) twice daily for 5 days and ritonavir (100 mg) one tablet twice daily for 5 days., Disp: 30 tablet, Rfl: 0 .  losartan (COZAAR) 50 MG tablet, Take 50 mg by mouth daily., Disp: , Rfl:  .  Multiple Vitamin (MULTIVITAMIN) tablet, Take 1 tablet by mouth daily.  , Disp: , Rfl:   Objective: Patient appears/sounds congested and mildly ill.   They are in no apparent distress.  Breathing is non labored.  Mood and behavior are normal.  Laboratory Data:  No results found for this or any previous visit (from the past 2160 hour(s)).   Lab Results  Component Value Date   CREATININE 0.73 10/02/2019     Assessment: 65 y.o. female with mild/moderate COVID 19 viral infection diagnosed on 10/10/2020 at high risk for progression to severe COVID 19.  Plan:  This patient is a 65 y.o. female that meets the following criteria for Emergency Use Authorization of: Paxlovid 1. Age >12 yr AND > 40 kg 2. SARS-COV-2 positive test 3. Symptom onset < 5 days 4. Mild-to-moderate COVID disease with high risk for severe progression to hospitalization or death  I have spoken and communicated the following to the patient or parent/caregiver regarding: 1. Paxlovid is an unapproved drug that is authorized for use under an Emergency Use Authorization.  2. There are no adequate, approved, available products for the treatment of COVID-19 in adults who have mild-to-moderate COVID-19 and are at high risk for progressing to severe COVID-19, including hospitalization or death. 3. Other therapeutics are currently authorized. For additional information on all products authorized for treatment or prevention of COVID-19, please see [email or web address].  4. There are benefits and risks of taking this treatment as outlined in the "Fact Sheet for Patients and Caregivers."  5. "Fact Sheet for Patients and Caregivers" was reviewed with patient. A hard copy will be provided to patient from pharmacy prior to the  patient receiving treatment. 6. Patients should continue to self-isolate and use infection control measures (e.g., wear mask, isolate, social distance, avoid sharing personal items, clean and disinfect "high touch" surfaces, and frequent handwashing) according to CDC  guidelines.  7. The patient or parent/caregiver has the option to accept or refuse treatment. 8. Patient medication history was reviewed for potential drug interactions:Interaction with home meds: tussionex may be increased sedating, possible decreased effect of wellbutrin - will monitor 9. Patient's creatinine clearance was calculated to be 139 mL/min , and they were therefore prescribed Normal dose (CrCl>60) - nirmatrelvir 150mg  tab (2 tablet) by mouth twice daily AND ritonavir 100mg  tab (1 tablet) by mouth twice daily   After reviewing above information with the patient, the patient agrees to receive Paxlovid.  Follow up instructions:    . Take prescription BID x 5 days as directed . Reach out to pharmacist for counseling on medication if desired . For concerns regarding further COVID symptoms please follow up with your PCP or urgent care . For urgent or life-threatening issues, seek care at your local emergency department  The patient was provided an opportunity to ask questions, and all were answered. The patient agreed with the plan and demonstrated an understanding of the instructions.   Script sent to [facility] and opted to pick up RX.  The patient was advised to call their PCP or seek an in-person evaluation if the symptoms worsen or if the condition fails to improve as anticipated.   I provided 10 minutes of non face-to-face telephone visit time during this encounter, and > 50% was spent counseling as documented under my assessment & plan.  Janene Madeira, NP 10/11/2020 /2:27 PM

## 2020-10-11 NOTE — Telephone Encounter (Signed)
Patient was prescribed oral covid treatment Paxlovid and treatment note was reviewed. Medication has been received by [place] and reviewed for appropriateness.  Drug Interactions or Dosage Adjustments Noted: Monitor drowsiness with Tussionex   Delivery Method: pick up  Patient contacted for counseling on 10/11/20 and verbalized understanding.   Delivery or Pick-Up Date: 10/11/20   Alinda Dooms 10/11/2020, 3:46 PM [facility] Outpatient Pharmacist Phone# [phone]

## 2020-10-14 ENCOUNTER — Other Ambulatory Visit (HOSPITAL_COMMUNITY): Payer: Self-pay | Admitting: Physician Assistant

## 2020-10-14 MED FILL — BUPROPION HCL SR 100 MG TAB: 100 | 90 days supply | Qty: 180 | Fill #0

## 2020-10-16 ENCOUNTER — Other Ambulatory Visit: Payer: 59

## 2020-10-16 ENCOUNTER — Ambulatory Visit
Admission: RE | Admit: 2020-10-16 | Discharge: 2020-10-16 | Disposition: A | Discharge status: Home / Self Care | Payer: 59 | Source: Ambulatory Visit | Attending: Internal Medicine | Admitting: Internal Medicine

## 2020-10-16 DIAGNOSIS — Z8249 Family history of ischemic heart disease and other diseases of the circulatory system: Secondary | ICD-10-CM

## 2020-12-09 ENCOUNTER — Other Ambulatory Visit (HOSPITAL_COMMUNITY): Payer: Self-pay | Admitting: Internal Medicine

## 2020-12-09 MED FILL — LOSARTAN POTASSIUM 50 MG TA: 50 | 30 days supply | Qty: 30 | Fill #0

## 2020-12-11 DIAGNOSIS — G4733 Obstructive sleep apnea (adult) (pediatric): Secondary | ICD-10-CM | POA: Diagnosis not present

## 2020-12-29 ENCOUNTER — Other Ambulatory Visit (HOSPITAL_COMMUNITY): Payer: Self-pay

## 2021-01-08 ENCOUNTER — Other Ambulatory Visit: Payer: Self-pay | Admitting: Obstetrics

## 2021-01-08 DIAGNOSIS — Z1231 Encounter for screening mammogram for malignant neoplasm of breast: Secondary | ICD-10-CM

## 2021-01-10 ENCOUNTER — Other Ambulatory Visit (HOSPITAL_COMMUNITY): Payer: Self-pay

## 2021-01-10 MED ORDER — LOMAIRA 8 MG PO TABS
8.0000 mg | ORAL_TABLET | Freq: Every day | ORAL | 0 refills | Status: DC
  Filled 2021-01-10: qty 90, 90d supply, fill #0

## 2021-01-10 MED FILL — Losartan Potassium Tab 50 MG: ORAL | 90 days supply | Qty: 90 | Fill #0 | Status: AC

## 2021-01-10 MED FILL — Bupropion HCl Tab ER 12HR 100 MG: ORAL | 90 days supply | Qty: 180 | Fill #0 | Status: AC

## 2021-01-13 ENCOUNTER — Other Ambulatory Visit (HOSPITAL_COMMUNITY): Payer: Self-pay

## 2021-01-17 ENCOUNTER — Other Ambulatory Visit: Payer: Self-pay

## 2021-01-17 ENCOUNTER — Other Ambulatory Visit (HOSPITAL_BASED_OUTPATIENT_CLINIC_OR_DEPARTMENT_OTHER): Payer: Self-pay

## 2021-01-17 ENCOUNTER — Ambulatory Visit: Payer: No Typology Code available for payment source | Attending: Internal Medicine

## 2021-01-17 DIAGNOSIS — Z23 Encounter for immunization: Secondary | ICD-10-CM

## 2021-01-17 MED ORDER — PFIZER-BIONT COVID-19 VAC-TRIS 30 MCG/0.3ML IM SUSP
INTRAMUSCULAR | 0 refills | Status: AC
  Filled 2021-01-17: qty 0.3, 1d supply, fill #0

## 2021-01-17 NOTE — Progress Notes (Signed)
   Covid-19 Vaccination Clinic  Name:  Melissa Morrow    MRN: [id number] DOB: 09-27-1955  01/17/2021  Melissa Morrow was observed post Covid-19 immunization for 15 minutes without incident. She was provided with Vaccine Information Sheet and instruction to access the V-Safe system.   Melissa Morrow was instructed to call 911 with any severe reactions post vaccine: Marland Kitchen Difficulty breathing  . Swelling of face and throat  . A fast heartbeat  . A bad rash all over body  . Dizziness and weakness   Immunizations Administered    Name Date Dose VIS Date Route   PFIZER Comrnaty(Gray TOP) Covid-19 Vaccine 01/17/2021  1:14 PM 0.3 mL 08/29/2020 Intramuscular   Manufacturer: [employer]   Lot: [id number]   [place]: [phone]

## 2021-02-06 ENCOUNTER — Ambulatory Visit
Admission: RE | Admit: 2021-02-06 | Discharge: 2021-02-06 | Disposition: A | Discharge status: Home / Self Care | Payer: 59 | Source: Ambulatory Visit | Attending: Obstetrics | Admitting: Obstetrics

## 2021-02-06 ENCOUNTER — Other Ambulatory Visit: Payer: Self-pay

## 2021-02-06 DIAGNOSIS — Z1231 Encounter for screening mammogram for malignant neoplasm of breast: Secondary | ICD-10-CM

## 2021-02-25 ENCOUNTER — Other Ambulatory Visit (HOSPITAL_COMMUNITY): Payer: Self-pay

## 2021-02-25 DIAGNOSIS — Z6833 Body mass index (BMI) 33.0-33.9, adult: Secondary | ICD-10-CM | POA: Diagnosis not present

## 2021-02-25 DIAGNOSIS — N952 Postmenopausal atrophic vaginitis: Secondary | ICD-10-CM | POA: Diagnosis not present

## 2021-02-25 DIAGNOSIS — Z124 Encounter for screening for malignant neoplasm of cervix: Secondary | ICD-10-CM | POA: Diagnosis not present

## 2021-02-25 DIAGNOSIS — Z01419 Encounter for gynecological examination (general) (routine) without abnormal findings: Secondary | ICD-10-CM | POA: Diagnosis not present

## 2021-02-25 DIAGNOSIS — Z01411 Encounter for gynecological examination (general) (routine) with abnormal findings: Secondary | ICD-10-CM | POA: Diagnosis not present

## 2021-02-25 DIAGNOSIS — N898 Other specified noninflammatory disorders of vagina: Secondary | ICD-10-CM | POA: Diagnosis not present

## 2021-02-25 DIAGNOSIS — R87810 Cervical high risk human papillomavirus (HPV) DNA test positive: Secondary | ICD-10-CM | POA: Diagnosis not present

## 2021-02-25 MED ORDER — PREMARIN 0.625 MG/GM VA CREA
TOPICAL_CREAM | VAGINAL | 1 refills | Status: DC
  Filled 2021-02-25: qty 30, 90d supply, fill #0
  Filled 2021-09-09: qty 30, 90d supply, fill #1

## 2021-02-26 ENCOUNTER — Other Ambulatory Visit: Payer: 59

## 2021-03-04 ENCOUNTER — Other Ambulatory Visit (HOSPITAL_COMMUNITY): Payer: Self-pay

## 2021-03-04 MED ORDER — FOLIC ACID 1 MG PO TABS
1.0000 mg | ORAL_TABLET | Freq: Every day | ORAL | 3 refills | Status: DC
  Filled 2021-03-04 – 2021-03-20 (×2): qty 90, 90d supply, fill #0
  Filled 2021-06-10: qty 90, 90d supply, fill #1
  Filled 2021-09-07: qty 90, 90d supply, fill #2
  Filled 2021-12-11: qty 90, 90d supply, fill #3

## 2021-03-13 ENCOUNTER — Other Ambulatory Visit (HOSPITAL_COMMUNITY): Payer: Self-pay

## 2021-03-20 ENCOUNTER — Other Ambulatory Visit (HOSPITAL_COMMUNITY): Payer: Self-pay

## 2021-04-07 DIAGNOSIS — E669 Obesity, unspecified: Secondary | ICD-10-CM | POA: Diagnosis not present

## 2021-04-07 DIAGNOSIS — I10 Essential (primary) hypertension: Secondary | ICD-10-CM | POA: Diagnosis not present

## 2021-04-07 DIAGNOSIS — F32A Depression, unspecified: Secondary | ICD-10-CM | POA: Diagnosis not present

## 2021-04-07 DIAGNOSIS — Z713 Dietary counseling and surveillance: Secondary | ICD-10-CM | POA: Diagnosis not present

## 2021-04-08 ENCOUNTER — Other Ambulatory Visit (HOSPITAL_COMMUNITY): Payer: Self-pay

## 2021-04-08 MED ORDER — BUPROPION HCL ER (SR) 100 MG PO TB12
100.0000 mg | ORAL_TABLET | Freq: Two times a day (BID) | ORAL | 1 refills | Status: DC
  Filled 2021-04-08: qty 180, 90d supply, fill #0
  Filled 2021-07-14: qty 180, 90d supply, fill #1

## 2021-04-09 ENCOUNTER — Other Ambulatory Visit (HOSPITAL_COMMUNITY): Payer: Self-pay

## 2021-04-09 DIAGNOSIS — N950 Postmenopausal bleeding: Secondary | ICD-10-CM | POA: Diagnosis not present

## 2021-04-09 MED FILL — Losartan Potassium Tab 50 MG: ORAL | 90 days supply | Qty: 90 | Fill #1 | Status: AC

## 2021-04-12 ENCOUNTER — Other Ambulatory Visit (HOSPITAL_COMMUNITY): Payer: Self-pay

## 2021-04-14 DIAGNOSIS — G4733 Obstructive sleep apnea (adult) (pediatric): Secondary | ICD-10-CM | POA: Diagnosis not present

## 2021-04-16 DIAGNOSIS — L720 Epidermal cyst: Secondary | ICD-10-CM | POA: Diagnosis not present

## 2021-04-16 DIAGNOSIS — D2372 Other benign neoplasm of skin of left lower limb, including hip: Secondary | ICD-10-CM | POA: Diagnosis not present

## 2021-04-16 DIAGNOSIS — D1801 Hemangioma of skin and subcutaneous tissue: Secondary | ICD-10-CM | POA: Diagnosis not present

## 2021-04-16 DIAGNOSIS — D2239 Melanocytic nevi of other parts of face: Secondary | ICD-10-CM | POA: Diagnosis not present

## 2021-04-16 DIAGNOSIS — D485 Neoplasm of uncertain behavior of skin: Secondary | ICD-10-CM | POA: Diagnosis not present

## 2021-04-16 DIAGNOSIS — L820 Inflamed seborrheic keratosis: Secondary | ICD-10-CM | POA: Diagnosis not present

## 2021-04-22 DIAGNOSIS — N950 Postmenopausal bleeding: Secondary | ICD-10-CM | POA: Diagnosis not present

## 2021-06-02 DIAGNOSIS — H90A22 Sensorineural hearing loss, unilateral, left ear, with restricted hearing on the contralateral side: Secondary | ICD-10-CM | POA: Diagnosis not present

## 2021-06-10 ENCOUNTER — Other Ambulatory Visit (HOSPITAL_COMMUNITY): Payer: Self-pay

## 2021-06-23 ENCOUNTER — Other Ambulatory Visit (HOSPITAL_COMMUNITY): Payer: Self-pay

## 2021-06-23 MED ORDER — ZOSTER VAC RECOMB ADJUVANTED 50 MCG/0.5ML IM SUSR
INTRAMUSCULAR | 1 refills | Status: AC
  Filled 2021-06-23: qty 0.5, 1d supply, fill #0
  Filled 2021-08-25 – 2021-09-18 (×3): qty 0.5, 1d supply, fill #1

## 2021-06-28 DIAGNOSIS — Z23 Encounter for immunization: Secondary | ICD-10-CM | POA: Diagnosis not present

## 2021-07-01 ENCOUNTER — Other Ambulatory Visit (HOSPITAL_COMMUNITY): Payer: Self-pay

## 2021-07-01 MED ORDER — PREVNAR 20 0.5 ML IM SUSY
PREFILLED_SYRINGE | INTRAMUSCULAR | 0 refills | Status: DC
  Filled 2021-07-01: qty 0.5, 1d supply, fill #0

## 2021-07-02 ENCOUNTER — Other Ambulatory Visit (HOSPITAL_COMMUNITY): Payer: Self-pay

## 2021-07-04 ENCOUNTER — Other Ambulatory Visit (HOSPITAL_COMMUNITY): Payer: Self-pay

## 2021-07-08 ENCOUNTER — Other Ambulatory Visit (HOSPITAL_COMMUNITY): Payer: Self-pay

## 2021-07-08 MED ORDER — LOSARTAN POTASSIUM 50 MG PO TABS
50.0000 mg | ORAL_TABLET | Freq: Every day | ORAL | 2 refills | Status: DC
  Filled 2021-07-08: qty 90, 90d supply, fill #0

## 2021-07-08 MED FILL — Losartan Potassium Tab 50 MG: ORAL | 90 days supply | Qty: 90 | Fill #2 | Status: CN

## 2021-07-14 ENCOUNTER — Other Ambulatory Visit (HOSPITAL_COMMUNITY): Payer: Self-pay

## 2021-07-14 DIAGNOSIS — G4733 Obstructive sleep apnea (adult) (pediatric): Secondary | ICD-10-CM | POA: Diagnosis not present

## 2021-07-21 ENCOUNTER — Other Ambulatory Visit: Payer: Self-pay

## 2021-07-21 ENCOUNTER — Other Ambulatory Visit (HOSPITAL_BASED_OUTPATIENT_CLINIC_OR_DEPARTMENT_OTHER): Payer: Self-pay

## 2021-07-21 ENCOUNTER — Ambulatory Visit: Payer: 59 | Attending: Internal Medicine

## 2021-07-21 DIAGNOSIS — Z23 Encounter for immunization: Secondary | ICD-10-CM

## 2021-07-21 MED ORDER — PFIZER COVID-19 VAC BIVALENT 30 MCG/0.3ML IM SUSP
INTRAMUSCULAR | 0 refills | Status: AC
  Filled 2021-07-21: qty 0.3, 1d supply, fill #0

## 2021-07-21 NOTE — Progress Notes (Signed)
   Covid-19 Vaccination Clinic  Name:  Melissa Morrow    MRN: [id number] DOB: [birth year]  07/21/2021  [name] was observed post Covid-19 immunization for 15 minutes without incident. She was provided with Vaccine Information Sheet and instruction to access the V-Safe system.   [name] was instructed to call 911 with any severe reactions post vaccine: Difficulty breathing  Swelling of face and throat  A fast heartbeat  A bad rash all over body  Dizziness and weakness   Immunizations Administered     Name Date Dose VIS Date Route   Pfizer Covid-19 Vaccine Bivalent Booster 07/21/2021  2:49 PM 0.3 mL 05/21/2021 Intramuscular   Manufacturer: [place]   Lot: [id number]   [place]: [phone]

## 2021-08-24 ENCOUNTER — Encounter (HOSPITAL_BASED_OUTPATIENT_CLINIC_OR_DEPARTMENT_OTHER): Payer: Self-pay | Admitting: Emergency Medicine

## 2021-08-24 ENCOUNTER — Other Ambulatory Visit: Payer: Self-pay

## 2021-08-24 ENCOUNTER — Emergency Department (HOSPITAL_BASED_OUTPATIENT_CLINIC_OR_DEPARTMENT_OTHER)
Admission: EM | Admit: 2021-08-24 | Discharge: 2021-08-24 | Disposition: A | Discharge status: Home / Self Care | Payer: 59 | Attending: Student | Admitting: Student

## 2021-08-24 DIAGNOSIS — T783XXA Angioneurotic edema, initial encounter: Secondary | ICD-10-CM | POA: Diagnosis not present

## 2021-08-24 DIAGNOSIS — R600 Localized edema: Secondary | ICD-10-CM | POA: Diagnosis present

## 2021-08-24 DIAGNOSIS — I10 Essential (primary) hypertension: Secondary | ICD-10-CM | POA: Insufficient documentation

## 2021-08-24 MED ORDER — DEXAMETHASONE 1 MG/ML PO CONC: 8.0000 mg | Freq: Once | ORAL | Status: DC

## 2021-08-24 MED ORDER — DEXAMETHASONE 10 MG/ML FOR PEDIATRIC ORAL USE
8.0000 mg | Freq: Once | INTRAMUSCULAR | Status: AC
  Administered 2021-08-24: 10:00:00 8 mg via ORAL
  Filled 2021-08-24: qty 1

## 2021-08-24 NOTE — ED Notes (Signed)
RN provided AVS using Teachback Method. Patient verbalizes understanding of Discharge Instructions. Opportunity for Questioning and Answers were provided by RN. Patient Discharged from ED ambulatory to Home with Family. ? ?

## 2021-08-24 NOTE — ED Notes (Signed)
PO challenge with saltine crackers.  Pt ate 2 crackers with no drink.  Pt is able to swallow dry crackers and reports "they do not feel like they are getting stuck, but it does take a few swallows to get them down."  Pt ate 3rd dry cracker with similar effort to first 2.  Pt able to drink fluids without complication or difficulty.

## 2021-08-24 NOTE — ED Triage Notes (Signed)
Pt reports waking this morning with her lips and throat feeling very swollen. Pt has had URI with sinus congestion and cough for 2 weeks and reports tick bite to right arm on Friday. Pt did not take any medications this morning and states lip swelling has decreased, but throat swelling persists. Denies throat pain or starting any new medications recently.

## 2021-08-24 NOTE — ED Notes (Signed)
Dr Matilde Sprang in room w/pt now.

## 2021-08-24 NOTE — ED Provider Notes (Signed)
[facility] EMERGENCY DEPT Provider Note   CSN: [id number] Arrival date & time: 08/24/21  1540     History Chief Complaint  Patient presents with   Oral Swelling    Lanyiah Brix is a 65 y.o. female with PMH previous DVT, HTN who presents the emergency department for evaluation of oral swelling.  Patient states that she has a previous history of angioedema with isolated lip swelling.  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  Patient states that she was bit by a tick 1 week ago and has been struggling with a upper respiratory virus over the last 14 days.  She states that she awoke this morning with significant lower lip swelling, her tongue sticking out of her mouth, and she had dysphagia with swallowing pills.  She states that she was significant difficulty with swallowing dry cereal.  She presents the emergency department today 3 hours prior to these symptoms starting and she states that they have improved significantly but is concerned as the dysphagia is a new symptom for her.  Patient states that 48 hours ago she did have a piece of pizza with pepperoni on it but has not had any other mammalian meat.  She states that the tick that she was bit by was not engorged, was small and black.  Unsure of the type of tick.  No associated rash, facial paralysis, joint pain or other symptoms associated with this.  She has never seen an allergy physician for her presumed angioedema.  She currently denies shortness of breath, nausea, vomiting, pruritus, rash or other systemic or anaphylactic symptoms.  HPI     Past Medical History:  Diagnosis Date   DVT (deep venous thrombosis) ([place])    Confirm location with patient.   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     Family History  Problem Relation Age of Onset   Heart disease Father        Acute MI   Breast cancer Maternal Grandmother     Social History   Tobacco Use   Smoking status: Never   Smokeless tobacco: Never  Substance Use Topics   Alcohol use: Yes    Comment: 1-2 glasses of wine per week.    Home Medications Prior to Admission medications   Medication Sig Start Date End Date Taking? Authorizing Provider  benzonatate (TESSALON) 100 MG capsule TAKE 1 CAPSULE BY MOUTH EVERY 8 HOURS AS NEEDED FOR COUGH 10/10/20 10/10/21  Ginger Organ., MD  buPROPion [facility] SR) 100 MG 12 hr tablet Take 1 tablet by mouth 2 (two) times daily. 03/18/20   [provider]  buPROPion (WELLBUTRIN SR) 100 MG 12 hr tablet TAKE 1 TABLET BY MOUTH TWICE DAILY 03/18/20 03/18/21  Fonnie Mu, PA-C  buPROPion [facility] SR) 100 MG 12 hr tablet TAKE 1 TABLET BY MOUTH TWICE DAILY 04/08/21     conjugated estrogens (PREMARIN) vaginal cream FILL APPLICATOR TO 4.2P FILL LINE OR PUT BLUEBERRY SIZED DROP ON FINGERTIP AND PLACE INTO VAGINA NIGHTLY FOR 2 WEEKS THEN APPLY TWICE WEEKLY 02/25/21     COVID-19 mRNA bivalent vaccine, Pfizer, (PFIZER COVID-19 VAC BIVALENT) injection Inject into the muscle. 07/21/21   Carlyle Basques, MD  COVID-19 mRNA Vac-TriS, Pfizer, (PFIZER-BIONT COVID-19 VAC-TRIS) SUSP injection Inject into the muscle. 01/17/21   Carlyle Basques, MD  folic acid  (FOLVITE) 1 MG tablet Take 1 tablet (1 mg total) by mouth daily. 03/04/21     losartan (COZAAR) 50 MG tablet Take 50 mg by mouth daily. 09/11/20   [provider]  losartan (COZAAR) 50 MG tablet TAKE 1 TABLET BY MOUTH DAILY 02/26/20 02/25/21  Ginger Organ., MD  losartan (COZAAR) 50 MG tablet Take 1 tablet by mouth once daily. 07/08/21     Multiple Vitamin (MULTIVITAMIN) tablet Take 1 tablet by mouth daily.      [provider]  Nirmatrelvir & Ritonavir 20 x 150 MG & 10 x 100MG  TBPK TAKE 3 TABLETS BY MOUTH 2 (TWO) TIMES DAILY FOR 5 DAYS. 10/11/20 10/11/21  Corinth Callas, NP  Phentermine HCl (LOMAIRA) 8 MG TABS Take 1 tablet (8 mg) by mouth daily at 2pm 01/10/21     Phentermine HCl 8 MG TABS TAKE 1 TABLET BY MOUTH ONCE A DAY AT 2 PM 08/29/20 02/25/21  Fonnie Mu, PA-C  pneumococcal 20-valent conjugate vaccine (PREVNAR 20) 0.5 ML injection To be administered by the pharmacist 07/01/21   Carlyle Basques, MD  Zoster Vaccine Adjuvanted [facility]) injection Inject into the muscle. 06/23/21   Carlyle Basques, MD    Allergies    Gadolinium derivatives and Ace inhibitors  Review of Systems   Review of Systems  Constitutional:  Negative for chills and fever.  HENT:  Positive for facial swelling and trouble swallowing. Negative for ear pain and sore throat.   Eyes:  Negative for pain and visual disturbance.  Respiratory:  Negative for cough and shortness of breath.   Cardiovascular:  Negative for chest pain and palpitations.  Gastrointestinal:  Negative for abdominal pain and vomiting.  Genitourinary:  Negative for dysuria and hematuria.  Musculoskeletal:  Negative for arthralgias and back pain.  Skin:  Negative for color change and rash.  Neurological:  Negative for seizures and syncope.  All other systems reviewed and are negative.  Physical Exam Updated Vital Signs BP (!) 156/84 (BP Location: Right Arm)   Pulse 72   Temp 98.1 F (36.7 C) (Oral)   Resp 16   Ht 5\' 7"   (1.702 m)   Wt 88.5 kg   SpO2 100%   BMI 30.54 kg/m   Physical Exam Vitals and nursing note reviewed.  Constitutional:      General: She is not in acute distress.    Appearance: She is well-developed.  HENT:     Head: Normocephalic and atraumatic.     Comments: Mild likely improving uvular swelling, minimal lip swelling Eyes:     Conjunctiva/sclera: Conjunctivae normal.  Cardiovascular:     Rate and Rhythm: Normal rate and regular rhythm.     Heart sounds: No murmur heard. Pulmonary:     Effort: Pulmonary effort  is normal. No respiratory distress.     Breath sounds: Normal breath sounds.  Abdominal:     Palpations: Abdomen is soft.     Tenderness: There is no abdominal tenderness.  Musculoskeletal:        General: No swelling.     Cervical back: Neck supple.  Skin:    General: Skin is warm and dry.     Capillary Refill: Capillary refill takes less than 2 seconds.  Neurological:     Mental Status: She is alert.  Psychiatric:        Mood and Affect: Mood normal.    ED Results / Procedures / Treatments   Labs (all labs ordered are listed, but only abnormal results are displayed) Labs Reviewed  C3 COMPLEMENT  C4 COMPLEMENT  C1 ESTERASE INHIBITOR    EKG None  Radiology No results found.  Procedures Procedures   Medications Ordered in ED Medications  dexamethasone (DECADRON) 10 MG/ML injection for Pediatric ORAL use 8 mg (has no administration in time range)    ED Course  I have reviewed the triage vital signs and the nursing notes.  Pertinent labs & imaging results that were available during my care of the patient were reviewed by me and considered in my medical decision making (see chart for details).    MDM Rules/Calculators/A&P                           Patient seen the emergency department for evaluation of oral swelling.  Physical exam reveals mild, likely improving uvular swelling and mild lower lip swelling but no evidence of airway threatening  or pharyngeal edema, no rash or urticaria.  No wheezing.  I spoke with allergy and immunology from [facility] who recommended C3, C4, C1 esterase and tryptase to differentiate between medication induced angioedema versus mast cell activation versus hereditary angioedema.  It is technically possible for the patient to have a delayed reaction from her pepperoni exposure after a tick bite if she has undiagnosed alpha gal but allergy and immunology would be the people to follow-up on this anyways.  In regards to the patient's dysphagia, she was given oral Decadron and was able to tolerate dry saltine crackers with significant improvement compared to her dry cereal that got stuck this morning.  She had no difficulties with liquids.  I spoke with ENT who states that if the patient has persistent dysphagia they would be happy to see her in clinic as long as she does not have airway compromise.  The patient does not have airway compromise and on reevaluation appears to be improving.  Patient will follow up with her primary care physician and set a routine appointment to follow with allergy and immunology at [facility].  Patient was given strict return precautions of which she voiced understanding and she was discharged. Final Clinical Impression(s) / ED Diagnoses Final diagnoses:  None    Rx / DC Orders ED Discharge Orders     None        Adream Parzych, MD 08/24/21 1116

## 2021-08-25 ENCOUNTER — Other Ambulatory Visit (HOSPITAL_COMMUNITY): Payer: Self-pay

## 2021-08-25 LAB — C4 COMPLEMENT: Complement C4, Body Fluid: 27 mg/dL (ref 12–38)

## 2021-08-25 LAB — C1 ESTERASE INHIBITOR: C1INH SerPl-mCnc: 36 mg/dL (ref 21–39)

## 2021-08-25 LAB — TRYPTASE: Tryptase: 5.5 ug/L (ref 2.2–13.2)

## 2021-08-25 LAB — C3 COMPLEMENT: C3 Complement: 123 mg/dL (ref 82–167)

## 2021-08-26 ENCOUNTER — Other Ambulatory Visit (HOSPITAL_COMMUNITY): Payer: Self-pay

## 2021-08-26 MED ORDER — EPINEPHRINE 0.3 MG/0.3ML IJ SOAJ
INTRAMUSCULAR | 6 refills | Status: DC
  Filled 2021-08-26: qty 2, 30d supply, fill #0

## 2021-08-26 MED ORDER — HYDROCHLOROTHIAZIDE 12.5 MG PO CAPS
12.5000 mg | ORAL_CAPSULE | Freq: Every morning | ORAL | 11 refills | Status: DC
  Filled 2021-08-26: qty 30, 30d supply, fill #0
  Filled 2021-09-22: qty 30, 30d supply, fill #1
  Filled 2021-10-20: qty 30, 30d supply, fill #2
  Filled 2021-11-19: qty 30, 30d supply, fill #3
  Filled 2021-12-22: qty 30, 30d supply, fill #4
  Filled 2022-01-19: qty 30, 30d supply, fill #5
  Filled 2022-02-17: qty 30, 30d supply, fill #6

## 2021-08-29 DIAGNOSIS — D841 Defects in the complement system: Secondary | ICD-10-CM | POA: Diagnosis not present

## 2021-09-01 ENCOUNTER — Other Ambulatory Visit (HOSPITAL_COMMUNITY): Payer: Self-pay

## 2021-09-01 DIAGNOSIS — Z6830 Body mass index (BMI) 30.0-30.9, adult: Secondary | ICD-10-CM | POA: Diagnosis not present

## 2021-09-01 DIAGNOSIS — Z713 Dietary counseling and surveillance: Secondary | ICD-10-CM | POA: Diagnosis not present

## 2021-09-01 DIAGNOSIS — E669 Obesity, unspecified: Secondary | ICD-10-CM | POA: Diagnosis not present

## 2021-09-01 DIAGNOSIS — F32A Depression, unspecified: Secondary | ICD-10-CM | POA: Diagnosis not present

## 2021-09-01 DIAGNOSIS — I10 Essential (primary) hypertension: Secondary | ICD-10-CM | POA: Diagnosis not present

## 2021-09-01 MED ORDER — LOMAIRA 8 MG PO TABS
ORAL_TABLET | ORAL | 0 refills | Status: DC
  Filled 2021-09-01: qty 90, 90d supply, fill #0

## 2021-09-02 ENCOUNTER — Other Ambulatory Visit (HOSPITAL_COMMUNITY): Payer: Self-pay

## 2021-09-05 ENCOUNTER — Other Ambulatory Visit (HOSPITAL_COMMUNITY): Payer: Self-pay

## 2021-09-05 MED ORDER — AMOXICILLIN 500 MG PO CAPS
ORAL_CAPSULE | ORAL | 0 refills | Status: DC
  Filled 2021-09-05: qty 21, 7d supply, fill #0

## 2021-09-06 ENCOUNTER — Other Ambulatory Visit (HOSPITAL_COMMUNITY): Payer: Self-pay

## 2021-09-08 ENCOUNTER — Other Ambulatory Visit (HOSPITAL_COMMUNITY): Payer: Self-pay

## 2021-09-09 ENCOUNTER — Other Ambulatory Visit (HOSPITAL_COMMUNITY): Payer: Self-pay

## 2021-09-18 ENCOUNTER — Other Ambulatory Visit (HOSPITAL_COMMUNITY): Payer: Self-pay

## 2021-09-22 ENCOUNTER — Other Ambulatory Visit (HOSPITAL_COMMUNITY): Payer: Self-pay

## 2021-09-24 DIAGNOSIS — H524 Presbyopia: Secondary | ICD-10-CM | POA: Diagnosis not present

## 2021-09-24 DIAGNOSIS — H5213 Myopia, bilateral: Secondary | ICD-10-CM | POA: Diagnosis not present

## 2021-09-25 DIAGNOSIS — G4733 Obstructive sleep apnea (adult) (pediatric): Secondary | ICD-10-CM | POA: Diagnosis not present

## 2021-10-10 DIAGNOSIS — I10 Essential (primary) hypertension: Secondary | ICD-10-CM | POA: Diagnosis not present

## 2021-10-10 DIAGNOSIS — E042 Nontoxic multinodular goiter: Secondary | ICD-10-CM | POA: Diagnosis not present

## 2021-10-17 DIAGNOSIS — Z0000 Encounter for general adult medical examination without abnormal findings: Secondary | ICD-10-CM | POA: Diagnosis not present

## 2021-10-17 DIAGNOSIS — Z86718 Personal history of other venous thrombosis and embolism: Secondary | ICD-10-CM | POA: Diagnosis not present

## 2021-10-17 DIAGNOSIS — Z87898 Personal history of other specified conditions: Secondary | ICD-10-CM | POA: Diagnosis not present

## 2021-10-17 DIAGNOSIS — R7301 Impaired fasting glucose: Secondary | ICD-10-CM | POA: Diagnosis not present

## 2021-10-17 DIAGNOSIS — Z1339 Encounter for screening examination for other mental health and behavioral disorders: Secondary | ICD-10-CM | POA: Diagnosis not present

## 2021-10-17 DIAGNOSIS — R82998 Other abnormal findings in urine: Secondary | ICD-10-CM | POA: Diagnosis not present

## 2021-10-17 DIAGNOSIS — I10 Essential (primary) hypertension: Secondary | ICD-10-CM | POA: Diagnosis not present

## 2021-10-17 DIAGNOSIS — R0989 Other specified symptoms and signs involving the circulatory and respiratory systems: Secondary | ICD-10-CM | POA: Diagnosis not present

## 2021-10-17 DIAGNOSIS — E871 Hypo-osmolality and hyponatremia: Secondary | ICD-10-CM | POA: Diagnosis not present

## 2021-10-17 DIAGNOSIS — R1909 Other intra-abdominal and pelvic swelling, mass and lump: Secondary | ICD-10-CM | POA: Diagnosis not present

## 2021-10-17 DIAGNOSIS — E669 Obesity, unspecified: Secondary | ICD-10-CM | POA: Diagnosis not present

## 2021-10-17 DIAGNOSIS — Z1331 Encounter for screening for depression: Secondary | ICD-10-CM | POA: Diagnosis not present

## 2021-10-20 ENCOUNTER — Other Ambulatory Visit (HOSPITAL_COMMUNITY): Payer: Self-pay

## 2021-10-23 ENCOUNTER — Other Ambulatory Visit (HOSPITAL_COMMUNITY): Payer: Self-pay

## 2021-10-24 ENCOUNTER — Other Ambulatory Visit (HOSPITAL_COMMUNITY): Payer: Self-pay

## 2021-10-24 MED ORDER — BUPROPION HCL ER (SR) 100 MG PO TB12
100.0000 mg | ORAL_TABLET | Freq: Two times a day (BID) | ORAL | 1 refills | Status: DC
  Filled 2021-10-24: qty 180, 90d supply, fill #0
  Filled 2022-01-19: qty 180, 90d supply, fill #1

## 2021-10-28 DIAGNOSIS — H90A22 Sensorineural hearing loss, unilateral, left ear, with restricted hearing on the contralateral side: Secondary | ICD-10-CM | POA: Diagnosis not present

## 2021-10-29 DIAGNOSIS — G4733 Obstructive sleep apnea (adult) (pediatric): Secondary | ICD-10-CM | POA: Diagnosis not present

## 2021-11-03 ENCOUNTER — Ambulatory Visit (HOSPITAL_COMMUNITY)
Admission: RE | Admit: 2021-11-03 | Discharge: 2021-11-03 | Disposition: A | Discharge status: Home / Self Care | Payer: 59 | Source: Ambulatory Visit | Attending: Internal Medicine | Admitting: Internal Medicine

## 2021-11-03 ENCOUNTER — Other Ambulatory Visit: Payer: Self-pay

## 2021-11-03 ENCOUNTER — Other Ambulatory Visit (HOSPITAL_COMMUNITY): Payer: Self-pay | Admitting: Internal Medicine

## 2021-11-03 DIAGNOSIS — R0989 Other specified symptoms and signs involving the circulatory and respiratory systems: Secondary | ICD-10-CM | POA: Insufficient documentation

## 2021-11-10 ENCOUNTER — Other Ambulatory Visit (HOSPITAL_COMMUNITY): Payer: Self-pay

## 2021-11-10 MED ORDER — ROSUVASTATIN CALCIUM 10 MG PO TABS
ORAL_TABLET | ORAL | 11 refills | Status: DC
  Filled 2021-11-10: qty 30, 30d supply, fill #0
  Filled 2021-12-10: qty 30, 30d supply, fill #1
  Filled 2022-01-11: qty 30, 30d supply, fill #2
  Filled 2022-02-12: qty 30, 30d supply, fill #3
  Filled 2022-03-15: qty 30, 30d supply, fill #4
  Filled 2022-04-11: qty 30, 30d supply, fill #5
  Filled 2022-05-14: qty 30, 30d supply, fill #6
  Filled 2022-06-15: qty 30, 30d supply, fill #7
  Filled 2022-07-16: qty 30, 30d supply, fill #8
  Filled 2022-08-15: qty 30, 30d supply, fill #9
  Filled 2022-09-08: qty 30, 30d supply, fill #10
  Filled 2022-10-10: qty 30, 30d supply, fill #11

## 2021-11-19 ENCOUNTER — Other Ambulatory Visit (HOSPITAL_COMMUNITY): Payer: Self-pay

## 2021-11-20 DIAGNOSIS — T783XXA Angioneurotic edema, initial encounter: Secondary | ICD-10-CM | POA: Diagnosis not present

## 2021-11-22 ENCOUNTER — Other Ambulatory Visit (HOSPITAL_COMMUNITY): Payer: Self-pay

## 2021-12-04 ENCOUNTER — Other Ambulatory Visit (HOSPITAL_COMMUNITY): Payer: Self-pay

## 2021-12-04 DIAGNOSIS — E669 Obesity, unspecified: Secondary | ICD-10-CM | POA: Diagnosis not present

## 2021-12-04 DIAGNOSIS — I10 Essential (primary) hypertension: Secondary | ICD-10-CM | POA: Diagnosis not present

## 2021-12-04 DIAGNOSIS — F32A Depression, unspecified: Secondary | ICD-10-CM | POA: Diagnosis not present

## 2021-12-04 DIAGNOSIS — Z713 Dietary counseling and surveillance: Secondary | ICD-10-CM | POA: Diagnosis not present

## 2021-12-04 DIAGNOSIS — R0989 Other specified symptoms and signs involving the circulatory and respiratory systems: Secondary | ICD-10-CM | POA: Diagnosis not present

## 2021-12-04 MED ORDER — SEMAGLUTIDE-WEIGHT MANAGEMENT 0.25 MG/0.5ML ~~LOC~~ SOAJ
SUBCUTANEOUS | 0 refills | Status: DC
  Filled 2021-12-04: qty 2, 28d supply, fill #0

## 2021-12-11 ENCOUNTER — Other Ambulatory Visit (HOSPITAL_COMMUNITY): Payer: Self-pay

## 2021-12-22 ENCOUNTER — Other Ambulatory Visit (HOSPITAL_COMMUNITY): Payer: Self-pay

## 2021-12-22 MED ORDER — LOMAIRA 8 MG PO TABS
ORAL_TABLET | ORAL | 0 refills | Status: DC
  Filled 2021-12-22: qty 90, 90d supply, fill #0

## 2021-12-23 ENCOUNTER — Other Ambulatory Visit (HOSPITAL_COMMUNITY): Payer: Self-pay

## 2022-01-12 ENCOUNTER — Other Ambulatory Visit (HOSPITAL_COMMUNITY): Payer: Self-pay

## 2022-01-13 ENCOUNTER — Other Ambulatory Visit (HOSPITAL_COMMUNITY): Payer: Self-pay

## 2022-01-19 ENCOUNTER — Other Ambulatory Visit (HOSPITAL_COMMUNITY): Payer: Self-pay

## 2022-01-20 DIAGNOSIS — F4320 Adjustment disorder, unspecified: Secondary | ICD-10-CM | POA: Diagnosis not present

## 2022-01-21 DIAGNOSIS — G4733 Obstructive sleep apnea (adult) (pediatric): Secondary | ICD-10-CM | POA: Diagnosis not present

## 2022-01-27 DIAGNOSIS — F4320 Adjustment disorder, unspecified: Secondary | ICD-10-CM | POA: Diagnosis not present

## 2022-02-03 DIAGNOSIS — F4320 Adjustment disorder, unspecified: Secondary | ICD-10-CM | POA: Diagnosis not present

## 2022-02-10 DIAGNOSIS — F4320 Adjustment disorder, unspecified: Secondary | ICD-10-CM | POA: Diagnosis not present

## 2022-02-12 ENCOUNTER — Other Ambulatory Visit (HOSPITAL_COMMUNITY): Payer: Self-pay

## 2022-02-13 DIAGNOSIS — E042 Nontoxic multinodular goiter: Secondary | ICD-10-CM | POA: Diagnosis not present

## 2022-02-13 DIAGNOSIS — I10 Essential (primary) hypertension: Secondary | ICD-10-CM | POA: Diagnosis not present

## 2022-02-17 ENCOUNTER — Other Ambulatory Visit: Payer: Self-pay | Admitting: Obstetrics

## 2022-02-17 ENCOUNTER — Other Ambulatory Visit (HOSPITAL_COMMUNITY): Payer: Self-pay

## 2022-02-17 DIAGNOSIS — Z1231 Encounter for screening mammogram for malignant neoplasm of breast: Secondary | ICD-10-CM

## 2022-02-17 DIAGNOSIS — F4320 Adjustment disorder, unspecified: Secondary | ICD-10-CM | POA: Diagnosis not present

## 2022-02-17 MED ORDER — LOMAIRA 8 MG PO TABS
ORAL_TABLET | ORAL | 0 refills | Status: DC
  Filled 2022-02-17: qty 90, 90d supply, fill #0
  Filled 2022-02-18: qty 90, fill #0
  Filled 2022-03-15: qty 90, 90d supply, fill #0

## 2022-02-17 MED ORDER — PREMARIN 0.625 MG/GM VA CREA
TOPICAL_CREAM | VAGINAL | 0 refills | Status: DC
  Filled 2022-02-17: qty 30, 90d supply, fill #0

## 2022-02-18 ENCOUNTER — Other Ambulatory Visit (HOSPITAL_COMMUNITY): Payer: Self-pay

## 2022-02-19 DIAGNOSIS — Z1231 Encounter for screening mammogram for malignant neoplasm of breast: Secondary | ICD-10-CM

## 2022-02-23 ENCOUNTER — Other Ambulatory Visit: Payer: Self-pay | Admitting: Obstetrics

## 2022-02-23 DIAGNOSIS — Z1231 Encounter for screening mammogram for malignant neoplasm of breast: Secondary | ICD-10-CM

## 2022-03-02 ENCOUNTER — Other Ambulatory Visit (HOSPITAL_COMMUNITY): Payer: Self-pay

## 2022-03-02 MED ORDER — LAGEVRIO 200 MG PO CAPS
ORAL_CAPSULE | ORAL | 0 refills | Status: DC
  Filled 2022-03-02: qty 40, 5d supply, fill #0

## 2022-03-09 DIAGNOSIS — Z1231 Encounter for screening mammogram for malignant neoplasm of breast: Secondary | ICD-10-CM

## 2022-03-10 DIAGNOSIS — F4320 Adjustment disorder, unspecified: Secondary | ICD-10-CM | POA: Diagnosis not present

## 2022-03-14 ENCOUNTER — Other Ambulatory Visit (HOSPITAL_COMMUNITY): Payer: Self-pay

## 2022-03-16 ENCOUNTER — Other Ambulatory Visit (HOSPITAL_COMMUNITY): Payer: Self-pay

## 2022-03-16 MED ORDER — HYDROCHLOROTHIAZIDE 12.5 MG PO CAPS
ORAL_CAPSULE | ORAL | 2 refills | Status: DC
  Filled 2022-03-16: qty 90, 90d supply, fill #0
  Filled 2022-06-20: qty 90, 90d supply, fill #1
  Filled 2022-09-17: qty 90, 90d supply, fill #2

## 2022-03-16 MED ORDER — FOLIC ACID 1 MG PO TABS
1.0000 mg | ORAL_TABLET | Freq: Every day | ORAL | 0 refills | Status: DC
  Filled 2022-03-16: qty 90, 90d supply, fill #0

## 2022-03-17 ENCOUNTER — Other Ambulatory Visit: Payer: Self-pay | Admitting: Obstetrics

## 2022-03-17 ENCOUNTER — Other Ambulatory Visit (HOSPITAL_COMMUNITY): Payer: Self-pay

## 2022-03-17 DIAGNOSIS — Z1231 Encounter for screening mammogram for malignant neoplasm of breast: Secondary | ICD-10-CM

## 2022-03-27 ENCOUNTER — Ambulatory Visit
Admission: RE | Admit: 2022-03-27 | Discharge: 2022-03-27 | Disposition: A | Discharge status: Home / Self Care | Payer: 59 | Source: Ambulatory Visit

## 2022-03-27 DIAGNOSIS — Z1231 Encounter for screening mammogram for malignant neoplasm of breast: Secondary | ICD-10-CM | POA: Diagnosis not present

## 2022-03-30 DIAGNOSIS — F4320 Adjustment disorder, unspecified: Secondary | ICD-10-CM | POA: Diagnosis not present

## 2022-03-31 ENCOUNTER — Other Ambulatory Visit: Payer: Self-pay | Admitting: Obstetrics

## 2022-03-31 DIAGNOSIS — R928 Other abnormal and inconclusive findings on diagnostic imaging of breast: Secondary | ICD-10-CM

## 2022-04-06 ENCOUNTER — Ambulatory Visit
Admission: RE | Admit: 2022-04-06 | Discharge: 2022-04-06 | Disposition: A | Discharge status: Home / Self Care | Payer: 59 | Source: Ambulatory Visit | Attending: Obstetrics | Admitting: Obstetrics

## 2022-04-06 DIAGNOSIS — R928 Other abnormal and inconclusive findings on diagnostic imaging of breast: Secondary | ICD-10-CM

## 2022-04-06 DIAGNOSIS — N6489 Other specified disorders of breast: Secondary | ICD-10-CM | POA: Diagnosis not present

## 2022-04-13 ENCOUNTER — Other Ambulatory Visit (HOSPITAL_COMMUNITY): Payer: Self-pay

## 2022-04-13 MED ORDER — BUPROPION HCL ER (SR) 100 MG PO TB12
100.0000 mg | ORAL_TABLET | Freq: Two times a day (BID) | ORAL | 1 refills | Status: DC
  Filled 2022-04-13: qty 180, 90d supply, fill #0
  Filled 2022-07-20: qty 180, 90d supply, fill #1

## 2022-04-15 DIAGNOSIS — G4733 Obstructive sleep apnea (adult) (pediatric): Secondary | ICD-10-CM | POA: Diagnosis not present

## 2022-04-28 DIAGNOSIS — D225 Melanocytic nevi of trunk: Secondary | ICD-10-CM | POA: Diagnosis not present

## 2022-04-28 DIAGNOSIS — D1801 Hemangioma of skin and subcutaneous tissue: Secondary | ICD-10-CM | POA: Diagnosis not present

## 2022-04-28 DIAGNOSIS — I8391 Asymptomatic varicose veins of right lower extremity: Secondary | ICD-10-CM | POA: Diagnosis not present

## 2022-04-28 DIAGNOSIS — D2261 Melanocytic nevi of right upper limb, including shoulder: Secondary | ICD-10-CM | POA: Diagnosis not present

## 2022-04-28 DIAGNOSIS — D2372 Other benign neoplasm of skin of left lower limb, including hip: Secondary | ICD-10-CM | POA: Diagnosis not present

## 2022-04-28 DIAGNOSIS — D2262 Melanocytic nevi of left upper limb, including shoulder: Secondary | ICD-10-CM | POA: Diagnosis not present

## 2022-04-28 DIAGNOSIS — B353 Tinea pedis: Secondary | ICD-10-CM | POA: Diagnosis not present

## 2022-04-28 DIAGNOSIS — L570 Actinic keratosis: Secondary | ICD-10-CM | POA: Diagnosis not present

## 2022-04-28 DIAGNOSIS — L821 Other seborrheic keratosis: Secondary | ICD-10-CM | POA: Diagnosis not present

## 2022-05-04 DIAGNOSIS — F4320 Adjustment disorder, unspecified: Secondary | ICD-10-CM | POA: Diagnosis not present

## 2022-05-15 ENCOUNTER — Other Ambulatory Visit (HOSPITAL_COMMUNITY): Payer: Self-pay

## 2022-05-18 ENCOUNTER — Other Ambulatory Visit (HOSPITAL_COMMUNITY): Payer: Self-pay

## 2022-05-18 MED ORDER — LOMAIRA 8 MG PO TABS
ORAL_TABLET | ORAL | 0 refills | Status: DC
  Filled 2022-05-19: qty 90, 90d supply, fill #0

## 2022-05-19 ENCOUNTER — Other Ambulatory Visit (HOSPITAL_COMMUNITY): Payer: Self-pay

## 2022-05-19 DIAGNOSIS — Z01411 Encounter for gynecological examination (general) (routine) with abnormal findings: Secondary | ICD-10-CM | POA: Diagnosis not present

## 2022-05-19 DIAGNOSIS — Z6832 Body mass index (BMI) 32.0-32.9, adult: Secondary | ICD-10-CM | POA: Diagnosis not present

## 2022-05-19 DIAGNOSIS — R87810 Cervical high risk human papillomavirus (HPV) DNA test positive: Secondary | ICD-10-CM | POA: Diagnosis not present

## 2022-05-19 DIAGNOSIS — Z01419 Encounter for gynecological examination (general) (routine) without abnormal findings: Secondary | ICD-10-CM | POA: Diagnosis not present

## 2022-05-19 DIAGNOSIS — Z124 Encounter for screening for malignant neoplasm of cervix: Secondary | ICD-10-CM | POA: Diagnosis not present

## 2022-05-19 DIAGNOSIS — N952 Postmenopausal atrophic vaginitis: Secondary | ICD-10-CM | POA: Diagnosis not present

## 2022-05-19 DIAGNOSIS — Z0142 Encounter for cervical smear to confirm findings of recent normal smear following initial abnormal smear: Secondary | ICD-10-CM | POA: Diagnosis not present

## 2022-05-19 MED ORDER — PREMARIN 0.625 MG/GM VA CREA
TOPICAL_CREAM | VAGINAL | 2 refills | Status: DC
  Filled 2022-05-19: qty 30, 70d supply, fill #0
  Filled 2022-10-10: qty 30, 70d supply, fill #1
  Filled 2022-10-10: qty 30, 30d supply, fill #1
  Filled 2023-03-10 – 2023-05-08 (×3): qty 30, 30d supply, fill #2

## 2022-05-19 MED ORDER — FOLIC ACID 1 MG PO TABS
1.0000 mg | ORAL_TABLET | Freq: Every day | ORAL | 3 refills | Status: DC
Start: 2022-05-19 — End: 2023-06-14
  Filled 2022-05-19: qty 90, 90d supply, fill #0
  Filled 2022-09-08: qty 90, 90d supply, fill #1
  Filled 2022-12-08: qty 90, 90d supply, fill #2
  Filled 2023-03-10 – 2023-03-11 (×2): qty 90, 90d supply, fill #3

## 2022-05-20 ENCOUNTER — Other Ambulatory Visit (HOSPITAL_COMMUNITY): Payer: Self-pay

## 2022-05-20 MED ORDER — LOMAIRA 8 MG PO TABS
ORAL_TABLET | ORAL | 0 refills | Status: DC
  Filled 2022-05-20: qty 135, 90d supply, fill #0
  Filled 2022-05-21: qty 10, 6d supply, fill #0
  Filled 2022-05-21: qty 135, 90d supply, fill #0
  Filled 2022-05-27: qty 125, 83d supply, fill #1

## 2022-05-21 ENCOUNTER — Other Ambulatory Visit (HOSPITAL_COMMUNITY): Payer: Self-pay

## 2022-05-27 ENCOUNTER — Other Ambulatory Visit (HOSPITAL_COMMUNITY): Payer: Self-pay

## 2022-06-01 DIAGNOSIS — F5081 Binge eating disorder: Secondary | ICD-10-CM | POA: Diagnosis not present

## 2022-06-15 ENCOUNTER — Other Ambulatory Visit (HOSPITAL_COMMUNITY): Payer: Self-pay

## 2022-06-19 DIAGNOSIS — F32A Depression, unspecified: Secondary | ICD-10-CM | POA: Diagnosis not present

## 2022-06-19 DIAGNOSIS — I10 Essential (primary) hypertension: Secondary | ICD-10-CM | POA: Diagnosis not present

## 2022-06-19 DIAGNOSIS — E669 Obesity, unspecified: Secondary | ICD-10-CM | POA: Diagnosis not present

## 2022-06-19 DIAGNOSIS — Z713 Dietary counseling and surveillance: Secondary | ICD-10-CM | POA: Diagnosis not present

## 2022-06-20 ENCOUNTER — Other Ambulatory Visit (HOSPITAL_COMMUNITY): Payer: Self-pay

## 2022-07-01 DIAGNOSIS — G4733 Obstructive sleep apnea (adult) (pediatric): Secondary | ICD-10-CM | POA: Diagnosis not present

## 2022-07-04 DIAGNOSIS — Z23 Encounter for immunization: Secondary | ICD-10-CM | POA: Diagnosis not present

## 2022-07-16 ENCOUNTER — Other Ambulatory Visit (HOSPITAL_COMMUNITY): Payer: Self-pay

## 2022-07-16 DIAGNOSIS — R635 Abnormal weight gain: Secondary | ICD-10-CM | POA: Diagnosis not present

## 2022-07-16 DIAGNOSIS — Z6829 Body mass index (BMI) 29.0-29.9, adult: Secondary | ICD-10-CM | POA: Diagnosis not present

## 2022-07-20 ENCOUNTER — Other Ambulatory Visit (HOSPITAL_COMMUNITY): Payer: Self-pay

## 2022-07-20 DIAGNOSIS — I10 Essential (primary) hypertension: Secondary | ICD-10-CM | POA: Diagnosis not present

## 2022-07-20 DIAGNOSIS — Z713 Dietary counseling and surveillance: Secondary | ICD-10-CM | POA: Diagnosis not present

## 2022-07-20 MED ORDER — BUPROPION HCL ER (SR) 100 MG PO TB12
100.0000 mg | ORAL_TABLET | Freq: Two times a day (BID) | ORAL | 1 refills | Status: DC
  Filled 2022-07-20 – 2022-10-18 (×2): qty 180, 90d supply, fill #0
  Filled 2023-01-20: qty 180, 90d supply, fill #1

## 2022-07-20 MED ORDER — LOMAIRA 8 MG PO TABS
8.0000 mg | ORAL_TABLET | Freq: Three times a day (TID) | ORAL | 0 refills | Status: DC
  Filled 2022-07-20 – 2022-07-23 (×3): qty 180, 60d supply, fill #0

## 2022-07-22 ENCOUNTER — Other Ambulatory Visit (HOSPITAL_COMMUNITY): Payer: Self-pay

## 2022-07-23 ENCOUNTER — Other Ambulatory Visit (HOSPITAL_COMMUNITY): Payer: Self-pay

## 2022-07-23 DIAGNOSIS — Z713 Dietary counseling and surveillance: Secondary | ICD-10-CM | POA: Diagnosis not present

## 2022-07-23 DIAGNOSIS — I10 Essential (primary) hypertension: Secondary | ICD-10-CM | POA: Diagnosis not present

## 2022-07-23 DIAGNOSIS — F32A Depression, unspecified: Secondary | ICD-10-CM | POA: Diagnosis not present

## 2022-07-23 DIAGNOSIS — Z6828 Body mass index (BMI) 28.0-28.9, adult: Secondary | ICD-10-CM | POA: Diagnosis not present

## 2022-07-23 DIAGNOSIS — E559 Vitamin D deficiency, unspecified: Secondary | ICD-10-CM | POA: Diagnosis not present

## 2022-07-24 ENCOUNTER — Other Ambulatory Visit (HOSPITAL_COMMUNITY): Payer: Self-pay

## 2022-08-06 ENCOUNTER — Other Ambulatory Visit (HOSPITAL_BASED_OUTPATIENT_CLINIC_OR_DEPARTMENT_OTHER): Payer: Self-pay

## 2022-08-06 MED ORDER — AREXVY 120 MCG/0.5ML IM SUSR
INTRAMUSCULAR | 0 refills | Status: AC
  Filled 2022-08-06: qty 0.5, 1d supply, fill #0

## 2022-08-15 ENCOUNTER — Other Ambulatory Visit (HOSPITAL_COMMUNITY): Payer: Self-pay

## 2022-08-26 DIAGNOSIS — G4733 Obstructive sleep apnea (adult) (pediatric): Secondary | ICD-10-CM | POA: Diagnosis not present

## 2022-09-08 ENCOUNTER — Other Ambulatory Visit: Payer: Self-pay

## 2022-09-09 ENCOUNTER — Other Ambulatory Visit (HOSPITAL_COMMUNITY): Payer: Self-pay

## 2022-10-10 ENCOUNTER — Other Ambulatory Visit (HOSPITAL_COMMUNITY): Payer: Self-pay

## 2022-10-13 ENCOUNTER — Other Ambulatory Visit (HOSPITAL_COMMUNITY): Payer: Self-pay

## 2022-10-13 DIAGNOSIS — M79644 Pain in right finger(s): Secondary | ICD-10-CM | POA: Diagnosis not present

## 2022-10-18 ENCOUNTER — Other Ambulatory Visit (HOSPITAL_COMMUNITY): Payer: Self-pay

## 2022-10-19 ENCOUNTER — Other Ambulatory Visit (HOSPITAL_COMMUNITY): Payer: Self-pay

## 2022-10-19 ENCOUNTER — Other Ambulatory Visit: Payer: Self-pay

## 2022-10-19 MED ORDER — HYDROCHLOROTHIAZIDE 12.5 MG PO CAPS
12.5000 mg | ORAL_CAPSULE | Freq: Every morning | ORAL | 2 refills | Status: DC
  Filled 2023-03-10: qty 90, 90d supply, fill #0

## 2022-11-12 ENCOUNTER — Other Ambulatory Visit (HOSPITAL_COMMUNITY): Payer: Self-pay

## 2022-11-12 MED ORDER — ROSUVASTATIN CALCIUM 10 MG PO TABS
10.0000 mg | ORAL_TABLET | Freq: Every day | ORAL | 3 refills | Status: DC
  Filled 2022-11-12: qty 90, 90d supply, fill #0
  Filled 2023-02-07: qty 90, 90d supply, fill #1
  Filled 2023-05-08: qty 90, 90d supply, fill #2
  Filled 2023-08-06: qty 90, 90d supply, fill #3

## 2022-12-08 ENCOUNTER — Other Ambulatory Visit: Payer: Self-pay

## 2022-12-08 ENCOUNTER — Other Ambulatory Visit (HOSPITAL_COMMUNITY): Payer: Self-pay

## 2022-12-08 MED ORDER — TOPIRAMATE 25 MG PO TABS
25.0000 mg | ORAL_TABLET | Freq: Every day | ORAL | 0 refills | Status: DC
  Filled 2022-12-08: qty 14, 14d supply, fill #0

## 2022-12-08 MED ORDER — TOPIRAMATE 25 MG PO TABS
ORAL_TABLET | ORAL | 0 refills | Status: DC
  Filled 2022-12-08 (×2): qty 90, 45d supply, fill #0

## 2022-12-12 ENCOUNTER — Other Ambulatory Visit (HOSPITAL_COMMUNITY): Payer: Self-pay

## 2022-12-14 ENCOUNTER — Other Ambulatory Visit (HOSPITAL_COMMUNITY): Payer: Self-pay

## 2022-12-14 MED ORDER — AMOXICILLIN 500 MG PO CAPS
ORAL_CAPSULE | ORAL | 0 refills | Status: DC
  Filled 2022-12-14: qty 31, 10d supply, fill #0

## 2022-12-15 ENCOUNTER — Other Ambulatory Visit (HOSPITAL_COMMUNITY): Payer: Self-pay

## 2022-12-15 MED ORDER — HYDROCHLOROTHIAZIDE 12.5 MG PO CAPS
12.5000 mg | ORAL_CAPSULE | Freq: Every morning | ORAL | 2 refills | Status: DC
  Filled 2022-12-15: qty 90, 90d supply, fill #0
  Filled 2023-03-10 – 2023-03-11 (×2): qty 90, 90d supply, fill #1
  Filled 2023-06-18: qty 90, 90d supply, fill #2

## 2022-12-16 ENCOUNTER — Other Ambulatory Visit (HOSPITAL_COMMUNITY): Payer: Self-pay

## 2022-12-22 ENCOUNTER — Other Ambulatory Visit (HOSPITAL_COMMUNITY): Payer: Self-pay

## 2022-12-22 MED ORDER — LOMAIRA 8 MG PO TABS
8.0000 mg | ORAL_TABLET | Freq: Every day | ORAL | 1 refills | Status: DC
  Filled 2022-12-22: qty 30, 30d supply, fill #0
  Filled 2023-01-20: qty 30, 30d supply, fill #1

## 2022-12-23 ENCOUNTER — Other Ambulatory Visit (HOSPITAL_COMMUNITY): Payer: Self-pay

## 2022-12-24 ENCOUNTER — Other Ambulatory Visit (HOSPITAL_COMMUNITY): Payer: Self-pay

## 2022-12-25 ENCOUNTER — Other Ambulatory Visit (HOSPITAL_COMMUNITY): Payer: Self-pay

## 2023-01-20 ENCOUNTER — Other Ambulatory Visit: Payer: Self-pay

## 2023-01-21 ENCOUNTER — Other Ambulatory Visit: Payer: Self-pay

## 2023-01-23 ENCOUNTER — Other Ambulatory Visit (HOSPITAL_COMMUNITY): Payer: Self-pay

## 2023-02-09 ENCOUNTER — Other Ambulatory Visit (HOSPITAL_COMMUNITY): Payer: Self-pay

## 2023-02-09 MED ORDER — LOMAIRA 8 MG PO TABS
8.0000 mg | ORAL_TABLET | Freq: Two times a day (BID) | ORAL | 1 refills | Status: DC
  Filled 2023-02-19: qty 60, 30d supply, fill #0
  Filled 2023-04-14: qty 60, 30d supply, fill #1

## 2023-02-19 ENCOUNTER — Other Ambulatory Visit (HOSPITAL_COMMUNITY): Payer: Self-pay

## 2023-02-22 ENCOUNTER — Other Ambulatory Visit (HOSPITAL_COMMUNITY): Payer: Self-pay

## 2023-02-23 ENCOUNTER — Other Ambulatory Visit (HOSPITAL_COMMUNITY): Payer: Self-pay

## 2023-03-08 ENCOUNTER — Ambulatory Visit (HOSPITAL_COMMUNITY)
Admission: RE | Admit: 2023-03-08 | Discharge: 2023-03-08 | Disposition: A | Discharge status: Home / Self Care | Payer: Medicare Other | Source: Ambulatory Visit | Attending: Cardiovascular Disease | Admitting: Cardiovascular Disease

## 2023-03-08 ENCOUNTER — Other Ambulatory Visit (HOSPITAL_COMMUNITY): Payer: Self-pay | Admitting: Orthopedic Surgery

## 2023-03-08 DIAGNOSIS — M79662 Pain in left lower leg: Secondary | ICD-10-CM | POA: Diagnosis not present

## 2023-03-08 DIAGNOSIS — M79605 Pain in left leg: Secondary | ICD-10-CM

## 2023-03-10 ENCOUNTER — Other Ambulatory Visit (HOSPITAL_COMMUNITY): Payer: Self-pay

## 2023-03-10 MED ORDER — ERGOCALCIFEROL 1.25 MG (50000 UT) PO CAPS
ORAL_CAPSULE | ORAL | 2 refills | Status: DC
  Filled 2023-03-10: qty 12, 84d supply, fill #0
  Filled 2023-05-31: qty 12, 84d supply, fill #1
  Filled 2023-08-26: qty 12, 84d supply, fill #2

## 2023-03-11 ENCOUNTER — Other Ambulatory Visit: Payer: Self-pay

## 2023-03-11 ENCOUNTER — Other Ambulatory Visit (HOSPITAL_COMMUNITY): Payer: Self-pay

## 2023-03-15 ENCOUNTER — Encounter: Payer: Self-pay | Admitting: Orthopaedic Surgery

## 2023-03-15 ENCOUNTER — Other Ambulatory Visit (INDEPENDENT_AMBULATORY_CARE_PROVIDER_SITE_OTHER): Payer: Medicare Other

## 2023-03-15 ENCOUNTER — Ambulatory Visit (INDEPENDENT_AMBULATORY_CARE_PROVIDER_SITE_OTHER): Payer: Medicare Other | Admitting: Orthopaedic Surgery

## 2023-03-15 DIAGNOSIS — M1712 Unilateral primary osteoarthritis, left knee: Secondary | ICD-10-CM | POA: Diagnosis not present

## 2023-03-15 DIAGNOSIS — M25562 Pain in left knee: Secondary | ICD-10-CM | POA: Diagnosis not present

## 2023-03-15 MED ORDER — METHYLPREDNISOLONE ACETATE 40 MG/ML IJ SUSP
40.0000 mg | INTRAMUSCULAR | Status: AC | PRN
Start: 2023-03-15 — End: 2023-03-15
  Administered 2023-03-15: 40 mg via INTRA_ARTICULAR

## 2023-03-15 MED ORDER — LIDOCAINE HCL 1 % IJ SOLN
3.0000 mL | INTRAMUSCULAR | Status: AC | PRN
Start: 2023-03-15 — End: 2023-03-15
  Administered 2023-03-15: 3 mL

## 2023-03-15 NOTE — Progress Notes (Signed)
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  She has had intermittent pain for many years now but really has a hard time going up and down stairs.  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  She is not on blood thinning medications.  She has a [place] filter.  She has never had a steroid injection in that left knee and it does not sound like she has had any surgery in the knee.  She did have a Doppler study last week to rule out DVT and this was negative.  She points to the lateral aspect of her knee and leg as a source of her pain.  She had recent x-rays which the radiologist read is the potential for synovial osteochondromatosis.  I did get a new set of x-rays today.  When she does ambulate she does have valgus malalignment of her left knee but her right knee is straight.  Her left leg has chronic swelling compared to the other leg.  The knee does have valgus malalignment on exam with medial joint line tenderness but there is also significant patellofemoral crepitation and lateral tenderness which is more significant.  There is pain along the IT band as well.  X-rays in the office reviewed today.  And she does have tricompartmental osteoarthritis of the knee with valgus malalignment of the left knee.  There are osteophytes in all 3 compartments and bone-on-bone wear of the lateral and patellofemoral compartment.  There are appears to be some loose bodies in the medial aspect of the knee but this is not indicative of synovial chondromatosis at all.  This is significant tricompartmental osteoarthritis.  I showed her knee replacement model and the knee and model in general.  We went over x-rays in detail.  I do feel it is worth her trying some knee conditioning exercises and a steroid injection since this is only been hurting her significantly for about a week.  She agreed to this and tolerated the  steroid injection well.  I would like to see her back in a month to see how she is doing overall.  She agrees with this treatment plan as well.     Procedure Note  Patient: Melissa Morrow             Date of Birth: 1956/04/01           MRN: [id number]             Visit Date: 03/15/2023  Procedures: Visit Diagnoses:  1. Acute pain of left knee   2. Unilateral primary osteoarthritis, left knee     Large Joint Inj: L knee on 03/15/2023 9:31 AM Indications: diagnostic evaluation and pain Details: 22 G 1.5 in needle, superolateral approach  Arthrogram: No  Medications: 3 mL lidocaine 1 %; 40 mg methylPREDNISolone acetate 40 MG/ML Outcome: tolerated well, no immediate complications Procedure, treatment alternatives, risks and benefits explained, specific risks discussed. Consent was given by the patient. Immediately prior to procedure a time out was called to verify the correct patient, procedure, equipment, support staff and site/side marked as required. Patient was prepped and draped in the usual sterile fashion.

## 2023-04-02 ENCOUNTER — Other Ambulatory Visit: Payer: Self-pay | Admitting: Obstetrics

## 2023-04-02 DIAGNOSIS — Z1231 Encounter for screening mammogram for malignant neoplasm of breast: Secondary | ICD-10-CM

## 2023-04-12 ENCOUNTER — Ambulatory Visit: Payer: Medicare Other | Admitting: Orthopaedic Surgery

## 2023-04-13 ENCOUNTER — Encounter: Payer: Self-pay | Admitting: Orthopaedic Surgery

## 2023-04-13 ENCOUNTER — Ambulatory Visit (INDEPENDENT_AMBULATORY_CARE_PROVIDER_SITE_OTHER): Payer: Medicare Other | Admitting: Orthopaedic Surgery

## 2023-04-13 DIAGNOSIS — M1712 Unilateral primary osteoarthritis, left knee: Secondary | ICD-10-CM | POA: Diagnosis not present

## 2023-04-13 NOTE — Progress Notes (Signed)
HPI: Ms. Hamblen returns today for follow-up of her left knee.  She states the injection given on 03/15/2023 was very helpful.  She states she only has some stiffness first thing in the morning.  She had no adverse effect to the injection.  Review of systems see HPI otherwise negative  Physical exam: General: No acute distress.  Ambulates with a nonantalgic gait without any assistive device. Left knee: Full extension and flexion.  Valgus malalignment.  Left foot no valgus malalignment.  5 out of 5 strength posterior tibial tendon with inversion against resistance.  Impression: Tricompartmental arthritis left knee  Plan: Will have her continue to work on quad strengthening exercises reviewed.  Knee friendly exercises were reviewed.  Follow-up as needed she needs to wait at least 3 months between cortisone injections.  Questions were encouraged and answered at length.  Discussed the use of Voltaren gel up to 4 g 4 times daily to the left knee.

## 2023-04-15 ENCOUNTER — Other Ambulatory Visit (HOSPITAL_COMMUNITY): Payer: Self-pay

## 2023-04-16 ENCOUNTER — Other Ambulatory Visit (HOSPITAL_COMMUNITY): Payer: Self-pay

## 2023-04-20 ENCOUNTER — Other Ambulatory Visit: Payer: Self-pay

## 2023-04-20 ENCOUNTER — Ambulatory Visit
Admission: RE | Admit: 2023-04-20 | Discharge: 2023-04-20 | Disposition: A | Discharge status: Home / Self Care | Payer: Medicare Other | Source: Ambulatory Visit | Attending: Obstetrics | Admitting: Obstetrics

## 2023-04-20 DIAGNOSIS — Z1231 Encounter for screening mammogram for malignant neoplasm of breast: Secondary | ICD-10-CM

## 2023-04-21 ENCOUNTER — Other Ambulatory Visit (HOSPITAL_COMMUNITY): Payer: Self-pay

## 2023-04-21 MED ORDER — BUPROPION HCL ER (SR) 100 MG PO TB12
100.0000 mg | ORAL_TABLET | Freq: Two times a day (BID) | ORAL | 3 refills | Status: DC
  Filled 2023-04-21: qty 180, 90d supply, fill #0
  Filled 2023-07-30: qty 180, 90d supply, fill #1
  Filled 2023-10-24: qty 180, 90d supply, fill #2
  Filled 2024-02-01: qty 180, 90d supply, fill #3

## 2023-04-23 ENCOUNTER — Other Ambulatory Visit: Payer: Self-pay | Admitting: Obstetrics

## 2023-04-23 DIAGNOSIS — R928 Other abnormal and inconclusive findings on diagnostic imaging of breast: Secondary | ICD-10-CM

## 2023-04-29 ENCOUNTER — Other Ambulatory Visit: Payer: Medicare Other

## 2023-05-01 ENCOUNTER — Other Ambulatory Visit (HOSPITAL_COMMUNITY): Payer: Self-pay

## 2023-05-08 ENCOUNTER — Other Ambulatory Visit: Payer: Self-pay

## 2023-05-11 ENCOUNTER — Ambulatory Visit
Admission: RE | Admit: 2023-05-11 | Discharge: 2023-05-11 | Disposition: A | Discharge status: Home / Self Care | Payer: Medicare Other | Source: Ambulatory Visit | Attending: Obstetrics | Admitting: Obstetrics

## 2023-05-11 ENCOUNTER — Ambulatory Visit: Payer: Medicare Other

## 2023-05-11 DIAGNOSIS — R928 Other abnormal and inconclusive findings on diagnostic imaging of breast: Secondary | ICD-10-CM

## 2023-06-07 ENCOUNTER — Other Ambulatory Visit (HOSPITAL_COMMUNITY): Payer: Self-pay

## 2023-06-08 ENCOUNTER — Other Ambulatory Visit (HOSPITAL_COMMUNITY): Payer: Self-pay

## 2023-06-13 ENCOUNTER — Other Ambulatory Visit (HOSPITAL_COMMUNITY): Payer: Self-pay

## 2023-06-14 ENCOUNTER — Other Ambulatory Visit (HOSPITAL_COMMUNITY): Payer: Self-pay

## 2023-06-14 MED ORDER — FOLIC ACID 1 MG PO TABS
1.0000 mg | ORAL_TABLET | Freq: Every day | ORAL | 0 refills | Status: DC
  Filled 2023-06-14: qty 90, 90d supply, fill #0

## 2023-06-15 ENCOUNTER — Other Ambulatory Visit (HOSPITAL_COMMUNITY): Payer: Self-pay

## 2023-06-21 ENCOUNTER — Other Ambulatory Visit (HOSPITAL_COMMUNITY): Payer: Self-pay

## 2023-06-21 ENCOUNTER — Ambulatory Visit: Payer: Medicare Other | Admitting: Orthopaedic Surgery

## 2023-06-23 ENCOUNTER — Other Ambulatory Visit (HOSPITAL_COMMUNITY): Payer: Self-pay

## 2023-06-23 MED ORDER — LOMAIRA 8 MG PO TABS
8.0000 mg | ORAL_TABLET | Freq: Two times a day (BID) | ORAL | 1 refills | Status: DC
  Filled 2023-06-23: qty 60, 30d supply, fill #0
  Filled 2023-08-09: qty 60, 30d supply, fill #1

## 2023-06-24 ENCOUNTER — Other Ambulatory Visit (HOSPITAL_COMMUNITY): Payer: Self-pay

## 2023-06-26 ENCOUNTER — Other Ambulatory Visit (HOSPITAL_COMMUNITY): Payer: Self-pay

## 2023-07-19 ENCOUNTER — Ambulatory Visit: Payer: Medicare Other | Admitting: Orthopaedic Surgery

## 2023-07-21 ENCOUNTER — Ambulatory Visit (INDEPENDENT_AMBULATORY_CARE_PROVIDER_SITE_OTHER): Payer: Medicare Other | Admitting: Orthopaedic Surgery

## 2023-07-21 DIAGNOSIS — M25562 Pain in left knee: Secondary | ICD-10-CM | POA: Diagnosis not present

## 2023-07-21 DIAGNOSIS — M25561 Pain in right knee: Secondary | ICD-10-CM

## 2023-07-21 DIAGNOSIS — M1712 Unilateral primary osteoarthritis, left knee: Secondary | ICD-10-CM | POA: Diagnosis not present

## 2023-07-21 DIAGNOSIS — G8929 Other chronic pain: Secondary | ICD-10-CM

## 2023-07-21 MED ORDER — METHYLPREDNISOLONE ACETATE 40 MG/ML IJ SUSP
40.0000 mg | INTRAMUSCULAR | Status: AC | PRN
Start: 2023-07-21 — End: 2023-07-21
  Administered 2023-07-21: 40 mg via INTRA_ARTICULAR

## 2023-07-21 MED ORDER — LIDOCAINE HCL 1 % IJ SOLN
3.0000 mL | INTRAMUSCULAR | Status: AC | PRN
Start: 2023-07-21 — End: 2023-07-21
  Administered 2023-07-21: 3 mL

## 2023-07-21 NOTE — Progress Notes (Signed)
The patient is well-known to me.  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  It has been 5 months and she last had steroid injection in that left knee.  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  There is lymphedema in that leg as well.  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  Her right knee has been hurting her recently underneath the kneecap especially when she is pushing up to get out of a chair compensating for her left knee.  She denies any injury to the right knee.  She is never had surgery or injection in that right knee.  The right knee is much smaller than the left knee.  There is patellofemoral pain and crepitation but good range of motion.  The right knee is loosely stable.  The left knee has some swelling and valgus malalignment with pain throughout the arc of motion of the left knee with no intractable arthritis.  We did talk about a steroid injection in both knees today since she is not a diabetic.  She agreed to this treatment plan and tolerated injections in both knees very well.  If things worsen she will let us know.  She knows to wait at least a minimum of 3 months between injections.  All questions and concerns were answered and addressed.    Procedure Note  Patient: Melissa Morrow             Date of Birth: 1956/08/06           MRN: [id number]             Visit Date: 07/21/2023  Procedures: Visit Diagnoses:  1. Unilateral primary osteoarthritis, left knee   2. Chronic pain of left knee   3. Acute pain of right knee     Large Joint Inj: R knee on 07/21/2023 9:04 AM Indications: diagnostic evaluation and pain Details: 22 G 1.5 in needle, superolateral approach  Arthrogram: No  Medications: 3 mL lidocaine 1 %; 40 mg methylPREDNISolone acetate 40 MG/ML Outcome: tolerated well, no immediate complications Procedure, treatment alternatives, risks and benefits explained, specific risks  discussed. Consent was given by the patient. Immediately prior to procedure a time out was called to verify the correct patient, procedure, equipment, support staff and site/side marked as required. Patient was prepped and draped in the usual sterile fashion.    Large Joint Inj: L knee on 07/21/2023 9:04 AM Indications: diagnostic evaluation and pain Details: 22 G 1.5 in needle, superolateral approach  Arthrogram: No  Medications: 3 mL lidocaine 1 %; 40 mg methylPREDNISolone acetate 40 MG/ML Outcome: tolerated well, no immediate complications Procedure, treatment alternatives, risks and benefits explained, specific risks discussed. Consent was given by the patient. Immediately prior to procedure a time out was called to verify the correct patient, procedure, equipment, support staff and site/side marked as required. Patient was prepped and draped in the usual sterile fashion.

## 2023-07-30 ENCOUNTER — Other Ambulatory Visit (HOSPITAL_COMMUNITY): Payer: Self-pay

## 2023-07-30 ENCOUNTER — Other Ambulatory Visit: Payer: Self-pay

## 2023-07-30 MED ORDER — PREMARIN 0.625 MG/GM VA CREA
TOPICAL_CREAM | VAGINAL | 2 refills | Status: DC
  Filled 2023-07-30: qty 30, 90d supply, fill #0

## 2023-08-09 ENCOUNTER — Other Ambulatory Visit (HOSPITAL_COMMUNITY): Payer: Self-pay

## 2023-08-10 ENCOUNTER — Other Ambulatory Visit: Payer: Self-pay

## 2023-08-10 ENCOUNTER — Other Ambulatory Visit (HOSPITAL_COMMUNITY): Payer: Self-pay

## 2023-08-10 MED ORDER — ESTRADIOL 0.1 MG/GM VA CREA
TOPICAL_CREAM | VAGINAL | 1 refills | Status: AC
  Filled 2023-08-10 (×2): qty 42.5, 90d supply, fill #0
  Filled 2023-08-26 – 2024-05-04 (×2): qty 42.5, 90d supply, fill #1

## 2023-08-13 ENCOUNTER — Other Ambulatory Visit (HOSPITAL_COMMUNITY): Payer: Self-pay

## 2023-08-16 ENCOUNTER — Other Ambulatory Visit (HOSPITAL_COMMUNITY): Payer: Self-pay

## 2023-08-21 ENCOUNTER — Other Ambulatory Visit (HOSPITAL_COMMUNITY): Payer: Self-pay

## 2023-08-26 ENCOUNTER — Other Ambulatory Visit: Payer: Self-pay

## 2023-09-01 ENCOUNTER — Other Ambulatory Visit: Payer: Self-pay

## 2023-09-02 ENCOUNTER — Other Ambulatory Visit: Payer: Self-pay

## 2023-09-02 ENCOUNTER — Other Ambulatory Visit (HOSPITAL_COMMUNITY): Payer: Self-pay

## 2023-09-07 ENCOUNTER — Other Ambulatory Visit (HOSPITAL_COMMUNITY): Payer: Self-pay

## 2023-09-07 ENCOUNTER — Other Ambulatory Visit (HOSPITAL_BASED_OUTPATIENT_CLINIC_OR_DEPARTMENT_OTHER): Payer: Self-pay

## 2023-09-07 MED ORDER — COVID-19 MRNA VAC-TRIS(PFIZER) 30 MCG/0.3ML IM SUSY
0.3000 mL | PREFILLED_SYRINGE | Freq: Once | INTRAMUSCULAR | 0 refills | Status: AC
  Filled 2023-09-07 (×2): qty 0.3, 1d supply, fill #0

## 2023-09-09 ENCOUNTER — Other Ambulatory Visit (HOSPITAL_COMMUNITY): Payer: Self-pay

## 2023-09-10 ENCOUNTER — Other Ambulatory Visit (HOSPITAL_COMMUNITY): Payer: Self-pay

## 2023-09-10 ENCOUNTER — Other Ambulatory Visit: Payer: Self-pay

## 2023-09-10 MED ORDER — HYDROCHLOROTHIAZIDE 12.5 MG PO CAPS
12.5000 mg | ORAL_CAPSULE | Freq: Every morning | ORAL | 2 refills | Status: DC
  Filled 2023-09-10: qty 90, 90d supply, fill #0
  Filled 2023-12-05: qty 90, 90d supply, fill #1
  Filled 2024-03-07: qty 90, 90d supply, fill #2

## 2023-09-11 ENCOUNTER — Other Ambulatory Visit (HOSPITAL_COMMUNITY): Payer: Self-pay

## 2023-09-13 ENCOUNTER — Other Ambulatory Visit (HOSPITAL_COMMUNITY): Payer: Self-pay

## 2023-09-13 MED ORDER — FOLIC ACID 1 MG PO TABS
1.0000 mg | ORAL_TABLET | Freq: Every day | ORAL | 3 refills | Status: DC
  Filled 2023-09-13: qty 90, 90d supply, fill #0
  Filled 2023-12-05: qty 90, 90d supply, fill #1
  Filled 2024-03-07: qty 90, 90d supply, fill #2
  Filled 2024-06-09: qty 90, 90d supply, fill #3

## 2023-09-14 ENCOUNTER — Other Ambulatory Visit (HOSPITAL_COMMUNITY): Payer: Self-pay

## 2023-10-22 ENCOUNTER — Other Ambulatory Visit (HOSPITAL_BASED_OUTPATIENT_CLINIC_OR_DEPARTMENT_OTHER): Payer: Self-pay

## 2023-10-22 ENCOUNTER — Other Ambulatory Visit: Payer: Self-pay

## 2023-10-22 ENCOUNTER — Other Ambulatory Visit (HOSPITAL_COMMUNITY): Payer: Self-pay

## 2023-10-22 MED ORDER — LOMAIRA 8 MG PO TABS
8.0000 mg | ORAL_TABLET | Freq: Two times a day (BID) | ORAL | 1 refills | Status: DC
  Filled 2023-10-22: qty 60, 30d supply, fill #0
  Filled 2023-12-05: qty 60, 30d supply, fill #1

## 2023-11-01 ENCOUNTER — Other Ambulatory Visit (HOSPITAL_COMMUNITY): Payer: Self-pay

## 2023-11-01 MED ORDER — OXYCODONE HCL 5 MG PO TABS
5.0000 mg | ORAL_TABLET | Freq: Three times a day (TID) | ORAL | 0 refills | Status: DC | PRN
  Filled 2023-11-01: qty 8, 2d supply, fill #0

## 2023-11-08 ENCOUNTER — Other Ambulatory Visit (HOSPITAL_COMMUNITY): Payer: Self-pay

## 2023-11-08 ENCOUNTER — Other Ambulatory Visit: Payer: Self-pay

## 2023-11-08 MED ORDER — ROSUVASTATIN CALCIUM 10 MG PO TABS
10.0000 mg | ORAL_TABLET | Freq: Every day | ORAL | 3 refills | Status: AC
  Filled 2023-11-08: qty 90, 90d supply, fill #0
  Filled 2024-02-03: qty 90, 90d supply, fill #1
  Filled 2024-05-04: qty 90, 90d supply, fill #2
  Filled 2024-08-21: qty 90, 90d supply, fill #3

## 2023-11-16 ENCOUNTER — Other Ambulatory Visit: Payer: Self-pay

## 2023-11-16 ENCOUNTER — Other Ambulatory Visit (HOSPITAL_COMMUNITY): Payer: Self-pay

## 2023-11-16 MED ORDER — NEOMYCIN-POLYMYXIN-HC 1 % OT SOLN
2.0000 [drp] | Freq: Two times a day (BID) | OTIC | 0 refills | Status: DC
  Filled 2023-11-16: qty 10, 50d supply, fill #0

## 2023-11-16 MED ORDER — ERGOCALCIFEROL 1.25 MG (50000 UT) PO CAPS
50000.0000 [IU] | ORAL_CAPSULE | ORAL | 2 refills | Status: DC
  Filled 2023-11-16: qty 12, 84d supply, fill #0
  Filled 2024-02-20: qty 12, 84d supply, fill #1
  Filled 2024-05-22: qty 12, 84d supply, fill #2

## 2023-11-17 ENCOUNTER — Encounter: Payer: Self-pay | Admitting: Orthopaedic Surgery

## 2023-11-17 ENCOUNTER — Other Ambulatory Visit (HOSPITAL_COMMUNITY): Payer: Self-pay

## 2023-11-17 ENCOUNTER — Ambulatory Visit (INDEPENDENT_AMBULATORY_CARE_PROVIDER_SITE_OTHER): Payer: Medicare Other | Admitting: Orthopaedic Surgery

## 2023-11-17 DIAGNOSIS — G8929 Other chronic pain: Secondary | ICD-10-CM | POA: Diagnosis not present

## 2023-11-17 DIAGNOSIS — M1712 Unilateral primary osteoarthritis, left knee: Secondary | ICD-10-CM | POA: Diagnosis not present

## 2023-11-17 DIAGNOSIS — M25562 Pain in left knee: Secondary | ICD-10-CM | POA: Diagnosis not present

## 2023-11-17 MED ORDER — CLINDAMYCIN HCL 300 MG PO CAPS
300.0000 mg | ORAL_CAPSULE | Freq: Three times a day (TID) | ORAL | 0 refills | Status: DC
  Filled 2023-11-17: qty 21, 7d supply, fill #0

## 2023-11-17 MED ORDER — METHYLPREDNISOLONE ACETATE 40 MG/ML IJ SUSP
40.0000 mg | INTRAMUSCULAR | Status: AC | PRN
Start: 2023-11-17 — End: 2023-11-17
  Administered 2023-11-17: 40 mg via INTRA_ARTICULAR

## 2023-11-17 MED ORDER — LIDOCAINE HCL 1 % IJ SOLN
3.0000 mL | INTRAMUSCULAR | Status: AC | PRN
Start: 2023-11-17 — End: 2023-11-17
  Administered 2023-11-17: 3 mL

## 2023-11-17 NOTE — Progress Notes (Signed)
 The patient is very well-known to me.  She is only 68 years old and does have significant and severe arthritis with her left knee.  Recently she did have an episode where her knee really swelled up on her.  It has been unstable feeling as well and she is getting close to considering knee replacement surgery.  It has been 4 months since she had a steroid injection in that left knee.  There is no effusion today.  The area that she reports the swelling may have been a prepatellar bursa.  I did try to aspirating fluid from that area but was unsuccessful.  I then placed a steroid injection in her left knee today per her request.  She is still to consider knee replacement surgery potentially in the summer.  If she gets to the point where she wants to consider that she knows to contact us and we will go from there.  All questions and concerns were addressed and answered.    Procedure Note  Patient: Melissa Morrow             Date of Birth: 1956-07-14           MRN: [id number]             Visit Date: 11/17/2023  Procedures: Visit Diagnoses:  1. Unilateral primary osteoarthritis, left knee   2. Chronic pain of left knee     Large Joint Inj: L knee on 11/17/2023 2:51 PM Indications: diagnostic evaluation and pain Details: 22 G 1.5 in needle, superolateral approach  Arthrogram: No  Medications: 3 mL lidocaine 1 %; 40 mg methylPREDNISolone acetate 40 MG/ML Outcome: tolerated well, no immediate complications Procedure, treatment alternatives, risks and benefits explained, specific risks discussed. Consent was given by the patient. Immediately prior to procedure a time out was called to verify the correct patient, procedure, equipment, support staff and site/side marked as required. Patient was prepped and draped in the usual sterile fashion.

## 2023-11-22 ENCOUNTER — Ambulatory Visit: Payer: Medicare Other | Admitting: Orthopaedic Surgery

## 2023-12-07 ENCOUNTER — Other Ambulatory Visit (HOSPITAL_COMMUNITY): Payer: Self-pay

## 2023-12-08 ENCOUNTER — Other Ambulatory Visit: Payer: Self-pay

## 2024-01-31 ENCOUNTER — Other Ambulatory Visit (HOSPITAL_COMMUNITY): Payer: Self-pay

## 2024-01-31 MED ORDER — FLUCONAZOLE 150 MG PO TABS
150.0000 mg | ORAL_TABLET | ORAL | 0 refills | Status: DC | PRN
  Filled 2024-01-31: qty 3, 3d supply, fill #0

## 2024-02-05 ENCOUNTER — Other Ambulatory Visit (HOSPITAL_COMMUNITY): Payer: Self-pay

## 2024-02-22 ENCOUNTER — Other Ambulatory Visit (HOSPITAL_COMMUNITY): Payer: Self-pay

## 2024-02-27 ENCOUNTER — Other Ambulatory Visit (HOSPITAL_COMMUNITY): Payer: Self-pay

## 2024-02-28 ENCOUNTER — Other Ambulatory Visit (HOSPITAL_COMMUNITY): Payer: Self-pay

## 2024-02-28 ENCOUNTER — Encounter: Payer: Self-pay | Admitting: Orthopaedic Surgery

## 2024-02-28 ENCOUNTER — Ambulatory Visit (INDEPENDENT_AMBULATORY_CARE_PROVIDER_SITE_OTHER): Admitting: Orthopaedic Surgery

## 2024-02-28 DIAGNOSIS — M1712 Unilateral primary osteoarthritis, left knee: Secondary | ICD-10-CM

## 2024-02-28 DIAGNOSIS — M25562 Pain in left knee: Secondary | ICD-10-CM

## 2024-02-28 DIAGNOSIS — G8929 Other chronic pain: Secondary | ICD-10-CM | POA: Diagnosis not present

## 2024-02-28 MED ORDER — LOMAIRA 8 MG PO TABS
8.0000 mg | ORAL_TABLET | Freq: Two times a day (BID) | ORAL | 1 refills | Status: DC
  Filled 2024-02-28: qty 60, 30d supply, fill #0

## 2024-02-28 MED ORDER — LIDOCAINE HCL 1 % IJ SOLN
3.0000 mL | INTRAMUSCULAR | Status: AC | PRN
  Administered 2024-02-28: 3 mL

## 2024-02-28 MED ORDER — METHYLPREDNISOLONE ACETATE 40 MG/ML IJ SUSP
40.0000 mg | INTRAMUSCULAR | Status: AC | PRN
  Administered 2024-02-28: 40 mg via INTRA_ARTICULAR

## 2024-02-28 NOTE — Progress Notes (Signed)
 The patient is a 68 year old female well-known to me.  She has significant and severe arthritis of her left knee with valgus malalignment.  She is requesting a steroid injection today in that left knee.  It has been over 3 months and she had a steroid injection in the knee.  She has been diagnosed with an inguinal hernia and so she is actually following up with general surgery tomorrow about the hernia.  She denies any acute change in her medical status otherwise.  Examination of her left knee shows significant valgus malalignment and pain throughout the arc of motion of the knee.  Again previous x-rays show severe end-stage arthritis of the left knee.  Per her request I did place a steroid injection in her left knee today that she tolerated very well.  She knows to reach out when she decides to consider knee replacement surgery or even a repeat injection in 3 to 4 months.    Procedure Note  Patient: Melissa Morrow             Date of Birth: 20-Jun-1956           MRN: [id number]             Visit Date: 02/28/2024  Procedures: Visit Diagnoses:  1. Unilateral primary osteoarthritis, left knee   2. Chronic pain of left knee     Large Joint Inj: L knee on 02/28/2024 2:45 PM Indications: diagnostic evaluation and pain Details: 22 G 1.5 in needle, superolateral approach  Arthrogram: No  Medications: 3 mL lidocaine  1 %; 40 mg methylPREDNISolone  acetate 40 MG/ML Outcome: tolerated well, no immediate complications Procedure, treatment alternatives, risks and benefits explained, specific risks discussed. Consent was given by the patient. Immediately prior to procedure a time out was called to verify the correct patient, procedure, equipment, support staff and site/side marked as required. Patient was prepped and draped in the usual sterile fashion.

## 2024-03-01 ENCOUNTER — Other Ambulatory Visit: Payer: Self-pay

## 2024-03-02 ENCOUNTER — Other Ambulatory Visit (HOSPITAL_COMMUNITY): Payer: Self-pay

## 2024-03-08 ENCOUNTER — Other Ambulatory Visit (HOSPITAL_COMMUNITY): Payer: Self-pay

## 2024-03-10 ENCOUNTER — Other Ambulatory Visit (HOSPITAL_COMMUNITY): Payer: Self-pay

## 2024-03-15 ENCOUNTER — Ambulatory Visit: Admitting: Orthopaedic Surgery

## 2024-03-16 ENCOUNTER — Other Ambulatory Visit (HOSPITAL_COMMUNITY): Payer: Self-pay

## 2024-03-29 ENCOUNTER — Other Ambulatory Visit (HOSPITAL_COMMUNITY): Payer: Self-pay

## 2024-03-29 MED ORDER — TAZAROTENE 0.1 % EX GEL
1.0000 | Freq: Every day | CUTANEOUS | 1 refills | Status: AC
  Filled 2024-03-29: qty 30, 90d supply, fill #0

## 2024-03-30 ENCOUNTER — Other Ambulatory Visit (HOSPITAL_COMMUNITY): Payer: Self-pay

## 2024-03-30 MED ORDER — TRETINOIN 0.025 % EX GEL
1.0000 | Freq: Every day | CUTANEOUS | 1 refills | Status: DC
  Filled 2024-03-30: qty 45, 90d supply, fill #0

## 2024-04-05 ENCOUNTER — Other Ambulatory Visit (HOSPITAL_COMMUNITY): Payer: Self-pay

## 2024-04-07 ENCOUNTER — Other Ambulatory Visit (HOSPITAL_COMMUNITY): Payer: Self-pay

## 2024-04-08 ENCOUNTER — Other Ambulatory Visit (HOSPITAL_COMMUNITY): Payer: Self-pay

## 2024-04-10 ENCOUNTER — Other Ambulatory Visit (HOSPITAL_COMMUNITY): Payer: Self-pay

## 2024-04-10 ENCOUNTER — Other Ambulatory Visit: Payer: Self-pay | Admitting: Obstetrics

## 2024-04-10 DIAGNOSIS — Z1231 Encounter for screening mammogram for malignant neoplasm of breast: Secondary | ICD-10-CM

## 2024-04-11 ENCOUNTER — Other Ambulatory Visit (HOSPITAL_COMMUNITY): Payer: Self-pay

## 2024-04-13 ENCOUNTER — Other Ambulatory Visit (HOSPITAL_COMMUNITY): Payer: Self-pay

## 2024-04-21 ENCOUNTER — Other Ambulatory Visit (HOSPITAL_COMMUNITY): Payer: Self-pay

## 2024-04-21 HISTORY — PX: HERNIA REPAIR: SHX51

## 2024-04-24 ENCOUNTER — Other Ambulatory Visit (HOSPITAL_COMMUNITY): Payer: Self-pay

## 2024-04-24 MED ORDER — TRAMADOL HCL 50 MG PO TABS
50.0000 mg | ORAL_TABLET | Freq: Four times a day (QID) | ORAL | 0 refills | Status: DC | PRN
  Filled 2024-04-24: qty 20, 5d supply, fill #0

## 2024-05-01 ENCOUNTER — Encounter (HOSPITAL_COMMUNITY): Payer: Self-pay

## 2024-05-02 ENCOUNTER — Other Ambulatory Visit (HOSPITAL_COMMUNITY): Payer: Self-pay

## 2024-05-04 ENCOUNTER — Other Ambulatory Visit: Payer: Self-pay

## 2024-05-04 ENCOUNTER — Encounter: Payer: Self-pay | Admitting: Orthopaedic Surgery

## 2024-05-04 ENCOUNTER — Other Ambulatory Visit (HOSPITAL_BASED_OUTPATIENT_CLINIC_OR_DEPARTMENT_OTHER): Payer: Self-pay

## 2024-05-05 ENCOUNTER — Other Ambulatory Visit (HOSPITAL_COMMUNITY): Payer: Self-pay

## 2024-05-05 MED ORDER — BUPROPION HCL ER (SR) 100 MG PO TB12
100.0000 mg | ORAL_TABLET | Freq: Two times a day (BID) | ORAL | 3 refills | Status: AC
  Filled 2024-05-05: qty 180, 90d supply, fill #0
  Filled 2024-07-30: qty 180, 90d supply, fill #1

## 2024-05-08 ENCOUNTER — Other Ambulatory Visit: Payer: Self-pay | Admitting: Medical Genetics

## 2024-05-15 ENCOUNTER — Encounter: Payer: Self-pay | Admitting: Orthopaedic Surgery

## 2024-05-15 ENCOUNTER — Ambulatory Visit (INDEPENDENT_AMBULATORY_CARE_PROVIDER_SITE_OTHER): Admitting: Orthopaedic Surgery

## 2024-05-15 ENCOUNTER — Telehealth: Payer: Self-pay

## 2024-05-15 ENCOUNTER — Ambulatory Visit
Admission: RE | Admit: 2024-05-15 | Discharge: 2024-05-15 | Disposition: A | Discharge status: Home / Self Care | Source: Ambulatory Visit | Attending: Obstetrics | Admitting: Obstetrics

## 2024-05-15 DIAGNOSIS — M1712 Unilateral primary osteoarthritis, left knee: Secondary | ICD-10-CM | POA: Diagnosis not present

## 2024-05-15 DIAGNOSIS — M25562 Pain in left knee: Secondary | ICD-10-CM

## 2024-05-15 DIAGNOSIS — G8929 Other chronic pain: Secondary | ICD-10-CM

## 2024-05-15 DIAGNOSIS — Z1231 Encounter for screening mammogram for malignant neoplasm of breast: Secondary | ICD-10-CM

## 2024-05-15 NOTE — Telephone Encounter (Signed)
 Pt called asking about Rx for compression stocking.  Former patient of Dr. Gerlean; has not been seen since the 1990's. Pt wishing to establish with Dr. Serene.  Pt will call office to schedule appt.

## 2024-05-15 NOTE — Progress Notes (Signed)
 The patient comes in today to discuss her upcoming left total knee arthroplasty that we will doing next month.  She has valgus malalignment of the knee.  She is 68 years old and has her husband with her today.  She has a lot of very appropriate questions that she has asked.  She has chronic lymphedema in that leg so we did discuss having her on stronger blood thinning medications after surgery such as Eliquis.  She also needs a Foley just after surgery as well that we will stay in until the next day.  I may even consider a CPM for her.  She does have compression garments that she has at home and they will probably not have 1 that can fit that leg right after surgery.  We did go over her x-rays and discussed what the surgery involves in detail.  We talked about sizing and other issues that can relate to performing the surgery.  All questions and concerns were answered and addressed.

## 2024-05-23 ENCOUNTER — Other Ambulatory Visit (HOSPITAL_COMMUNITY): Payer: Self-pay

## 2024-06-01 ENCOUNTER — Other Ambulatory Visit: Payer: Self-pay | Admitting: Physician Assistant

## 2024-06-01 DIAGNOSIS — Z01818 Encounter for other preprocedural examination: Secondary | ICD-10-CM

## 2024-06-03 ENCOUNTER — Other Ambulatory Visit (HOSPITAL_COMMUNITY): Payer: Self-pay

## 2024-06-06 ENCOUNTER — Other Ambulatory Visit (HOSPITAL_COMMUNITY): Payer: Self-pay

## 2024-06-06 MED ORDER — HYDROCHLOROTHIAZIDE 12.5 MG PO CAPS
12.5000 mg | ORAL_CAPSULE | Freq: Every morning | ORAL | 2 refills | Status: AC
  Filled 2024-06-06: qty 90, 90d supply, fill #0
  Filled 2024-09-05: qty 90, 90d supply, fill #1

## 2024-06-08 NOTE — Patient Instructions (Signed)
 SURGICAL WAITING ROOM VISITATION Patients having surgery or a procedure may have no more than 2 support people in the waiting area - these visitors may rotate in the visitor waiting room.   If the patient needs to stay at the hospital during part of their recovery, the visitor guidelines for inpatient rooms apply.  PRE-OP VISITATION  Pre-op nurse will coordinate an appropriate time for 1 support person to accompany the patient in pre-op.  This support person may not rotate.  This visitor will be contacted when the time is appropriate for the visitor to come back in the pre-op area.  Please refer to the [facility] website for the visitor guidelines for Inpatients (after your surgery is over and you are in a regular room).  You are not required to quarantine at this time prior to your surgery. However, you must do this: Hand Hygiene often Do NOT share personal items Notify your provider if you are in close contact with someone who has COVID or you develop fever 100.4 or greater, new onset of sneezing, cough, sore throat, shortness of breath or body aches.  If you test positive for Covid or have been in contact with anyone that has tested positive in the last 10 days please notify you surgeon.    Your procedure is scheduled on:  FRIDAY  06-16-24  Report to [facility] Main Entrance: [address] entrance where the [employer] is available.   Report to admitting at: 06:00    AM  Call this number if you have any questions or problems the morning of surgery [phone]  Do not eat food after Midnight the night prior to your surgery/procedure.  After Midnight you may have the following liquids until   05:30 AM DAY OF SURGERY  Clear Liquid Diet Water Black Coffee (sugar ok, NO MILK/CREAM OR CREAMERS)  Tea (sugar ok, NO MILK/CREAM OR CREAMERS) regular and decaf                             Plain Jell-O  with no fruit (NO RED)                                           Fruit ices (not  with fruit pulp, NO RED)                                     Popsicles (NO RED)                                                                  Juice: NO CITRUS JUICES: only apple, WHITE grape, WHITE cranberry Sports drinks like Gatorade or Powerade (NO RED)                   The day of surgery:  Drink ONE (1) Pre-Surgery Clear Ensure at 05:30 AM the morning of surgery. Drink in one sitting. Do not sip.  This drink was given to you during your hospital pre-op appointment visit. Nothing else to drink after completing the Pre-Surgery  Clear Ensure : No candy, chewing gum or throat lozenges.    FOLLOW ANY ADDITIONAL PRE OP INSTRUCTIONS YOU RECEIVED FROM YOUR SURGEON'S OFFICE!!!   Oral Hygiene is also important to reduce your risk of infection.        Remember - BRUSH YOUR TEETH THE MORNING OF SURGERY WITH YOUR REGULAR TOOTHPASTE  Do NOT smoke after Midnight the night before surgery.  STOP TAKING all Vitamins, Herbs and supplements 1 week before your surgery.   Take ONLY these medicines the morning of surgery with A SIP OF WATER: Bupropion    DO NOT TAKE Hydrochlorothiazide  (HCTZ) the morning of your surgery.    You may not have any metal on your body including hair pins, jewelry, and body piercing  Do not wear make-up, lotions, powders, perfumes or deodorant  Do not wear nail polish including gel and S&S, artificial / acrylic nails, or any other type of covering on natural nails including finger and toenails. If you have artificial nails, gel coating, etc., that needs to be removed by a nail salon, Please have this removed prior to surgery. Not doing so may mean that your surgery could be cancelled or delayed if the Surgeon or anesthesia staff feels like they are unable to monitor you safely.   Do not shave 48 hours prior to surgery to avoid nicks in your skin which may contribute to postoperative infections.   Contacts, Hearing Aids, dentures or bridgework may not be worn into  surgery. DENTURES WILL BE REMOVED PRIOR TO SURGERY PLEASE DO NOT APPLY Poly grip OR ADHESIVES!!!  You may bring a small overnight bag with you on the day of surgery, only pack items that are not valuable. [facility] IS NOT RESPONSIBLE   FOR VALUABLES THAT ARE LOST OR STOLEN.   Do not bring your home medications to the hospital. The Pharmacy will dispense medications listed on your medication list to you during your admission in the Hospital.   Please read over the following fact sheets you were given: IF YOU HAVE QUESTIONS ABOUT YOUR PRE-OP INSTRUCTIONS, PLEASE CALL [phone].     Pre-operative 5 CHG Bath Instructions   You can play a key role in reducing the risk of infection after surgery. Your skin needs to be as free of germs as possible. You can reduce the number of germs on your skin by washing with CHG (chlorhexidine gluconate) soap before surgery. CHG is an antiseptic soap that kills germs and continues to kill germs even after washing.   DO NOT use if you have an allergy to chlorhexidine/CHG or antibacterial soaps. If your skin becomes reddened or irritated, stop using the CHG and notify one of our RNs at [phone]  Please shower with the CHG soap starting 4 days before surgery using the following schedule: START SHOWERS ON  MONDAY  June 12, 2024  Please keep in mind the following:  DO NOT shave, including legs and underarms, starting the day of your first shower.   You may shave your face at any point before/day of surgery.   Place clean sheets on your bed the day you start using CHG soap. Use a clean washcloth (not used since being washed) for each shower. DO NOT sleep with pets once you start using the CHG.   CHG Shower Instructions:  If you choose to wash your hair and private area, wash  first with your normal shampoo/soap.  After you use shampoo/soap, rinse your hair and body thoroughly to remove shampoo/soap residue.  Turn the water OFF and apply about 3 tablespoons (45 ml) of CHG soap to a CLEAN washcloth.  Apply CHG soap ONLY FROM YOUR NECK DOWN TO YOUR TOES (washing for 3-5 minutes)  DO NOT use CHG soap on face, private areas, open wounds, or sores.  Pay special attention to the area where your surgery is being performed.  If you are having back surgery, having someone wash your back for you may be helpful.  Wait 2 minutes after CHG soap is applied, then you may rinse off the CHG soap.  Pat dry with a clean towel  Put on clean clothes/pajamas   If you choose to wear lotion, please use ONLY the CHG-compatible lotions on the back of this paper.     Additional instructions for the day of surgery: DO NOT APPLY any lotions, deodorants, cologne, or perfumes.   Put on clean/comfortable clothes.  Brush your teeth.  Ask your nurse before applying any prescription medications to the skin.      CHG Compatible Lotions   Aveeno Moisturizing lotion  Cetaphil Moisturizing Cream  Cetaphil Moisturizing Lotion  Clairol Herbal Essence Moisturizing Lotion, Dry Skin  Clairol Herbal Essence Moisturizing Lotion, Extra Dry Skin  Clairol Herbal Essence Moisturizing Lotion, Normal Skin  Curel Age Defying Therapeutic Moisturizing Lotion with Alpha Hydroxy  Curel Extreme Care Body Lotion  Curel Soothing Hands Moisturizing Hand Lotion  Curel Therapeutic Moisturizing Cream, Fragrance-Free  Curel Therapeutic Moisturizing Lotion, Fragrance-Free  Curel Therapeutic Moisturizing Lotion, Original Formula  Eucerin Daily Replenishing Lotion  Eucerin Dry Skin Therapy Plus Alpha Hydroxy Crme  Eucerin Dry Skin Therapy Plus Alpha Hydroxy Lotion  Eucerin Original Crme  Eucerin Original Lotion  Eucerin Plus Crme Eucerin Plus Lotion  Eucerin TriLipid Replenishing Lotion  Keri Anti-Bacterial  Hand Lotion  Keri Deep Conditioning Original Lotion Dry Skin Formula Softly Scented  Keri Deep Conditioning Original Lotion, Fragrance Free Sensitive Skin Formula  Keri Lotion Fast Absorbing Fragrance Free Sensitive Skin Formula  Keri Lotion Fast Absorbing Softly Scented Dry Skin Formula  Keri Original Lotion  Keri Skin Renewal Lotion Keri Silky Smooth Lotion  Keri Silky Smooth Sensitive Skin Lotion  Nivea Body Creamy Conditioning Oil  Nivea Body Extra Enriched Lotion  Nivea Body Original Lotion  Nivea Body Sheer Moisturizing Lotion Nivea Crme  Nivea Skin Firming Lotion  NutraDerm 30 Skin Lotion  NutraDerm Skin Lotion  NutraDerm Therapeutic Skin Cream  NutraDerm Therapeutic Skin Lotion  ProShield Protective Hand Cream  Provon moisturizing lotion   FAILURE TO FOLLOW THESE INSTRUCTIONS MAY RESULT IN THE CANCELLATION OF YOUR SURGERY  PATIENT SIGNATURE_________________________________  NURSE SIGNATURE__________________________________  ________________________________________________________________________        Melissa Morrow    An incentive spirometer is a tool that can help keep your lungs clear and active. This tool measures how well you are filling your lungs with each breath.  Taking long deep breaths may help reverse or decrease the chance of developing breathing (pulmonary) problems (especially infection) following: A long period of time when you are unable to move or be active. BEFORE THE PROCEDURE  If the spirometer includes an indicator to show your best effort, your nurse or respiratory therapist will set it to a desired goal. If possible, sit up straight or lean slightly forward. Try not to slouch. Hold the incentive spirometer in an upright position. INSTRUCTIONS FOR USE  Sit on the edge of your bed if possible, or sit up as far as you can in bed or on a chair. Hold the incentive spirometer in an upright position. Breathe out normally. Place the  mouthpiece in your mouth and seal your lips tightly around it. Breathe in slowly and as deeply as possible, raising the piston or the ball toward the top of the column. Hold your breath for 3-5 seconds or for as long as possible. Allow the piston or ball to fall to the bottom of the column. Remove the mouthpiece from your mouth and breathe out normally. Rest for a few seconds and repeat Steps 1 through 7 at least 10 times every 1-2 hours when you are awake. Take your time and take a few normal breaths between deep breaths. The spirometer may include an indicator to show your best effort. Use the indicator as a goal to work toward during each repetition. After each set of 10 deep breaths, practice coughing to be sure your lungs are clear. If you have an incision (the cut made at the time of surgery), support your incision when coughing by placing a pillow or rolled up towels firmly against it. Once you are able to get out of bed, walk around indoors and cough well. You may stop using the incentive spirometer when instructed by your caregiver.  RISKS AND COMPLICATIONS Take your time so you do not get dizzy or light-headed. If you are in pain, you may need to take or ask for pain medication before doing incentive spirometry. It is harder to take a deep breath if you are having pain. AFTER USE Rest and breathe slowly and easily. It can be helpful to keep track of a log of your progress. Your caregiver can provide you with a simple table to help with this. If you are using the spirometer at home, follow these instructions: SEEK MEDICAL CARE IF:  You are having difficultly using the spirometer. You have trouble using the spirometer as often as instructed. Your pain medication is not giving enough relief while using the spirometer. You develop fever of 100.5 F (38.1 C) or higher.                                                                                                    SEEK IMMEDIATE MEDICAL  CARE IF:  You cough up bloody sputum that had not been present before. You develop fever of 102 F (38.9 C) or greater. You develop worsening pain at or near the incision site. MAKE SURE YOU:  Understand these instructions. Will watch your  condition. Will get help right away if you are not doing well or get worse. Document Released: 01/18/2007 Document Revised: 11/30/2011 Document Reviewed: 03/21/2007 [facility] Patient Information 2014 [place], [place].         WHAT IS A BLOOD TRANSFUSION? Blood Transfusion Information  A transfusion is the replacement of blood or some of its parts. Blood is made up of multiple cells which provide different functions. Red blood cells carry oxygen and are used for blood loss replacement. White blood cells fight against infection. Platelets control bleeding. Plasma helps clot blood. Other blood products are available for specialized needs, such as hemophilia or other clotting disorders. BEFORE THE TRANSFUSION  Who gives blood for transfusions?  Healthy volunteers who are fully evaluated to make sure their blood is safe. This is blood bank blood. Transfusion therapy is the safest it has ever been in the practice of medicine. Before blood is taken from a donor, a complete history is taken to make sure that person has no history of diseases nor engages in risky social behavior (examples are intravenous drug use or sexual activity with multiple partners). The donor's travel history is screened to minimize risk of transmitting infections, such as malaria. The donated blood is tested for signs of infectious diseases, such as HIV and hepatitis. The blood is then tested to be sure it is compatible with you in order to minimize the chance of a transfusion reaction. If you or a relative donates blood, this is often done in anticipation of surgery and is not appropriate for emergency situations. It takes many days to process the donated blood. RISKS AND  COMPLICATIONS Although transfusion therapy is very safe and saves many lives, the main dangers of transfusion include:  Getting an infectious disease. Developing a transfusion reaction. This is an allergic reaction to something in the blood you were given. Every precaution is taken to prevent this. The decision to have a blood transfusion has been considered carefully by your caregiver before blood is given. Blood is not given unless the benefits outweigh the risks. AFTER THE TRANSFUSION Right after receiving a blood transfusion, you will usually feel much better and more energetic. This is especially true if your red blood cells have gotten low (anemic). The transfusion raises the level of the red blood cells which carry oxygen, and this usually causes an energy increase. The nurse administering the transfusion will monitor you carefully for complications. HOME CARE INSTRUCTIONS  No special instructions are needed after a transfusion. You may find your energy is better. Speak with your caregiver about any limitations on activity for underlying diseases you may have. SEEK MEDICAL CARE IF:  Your condition is not improving after your transfusion. You develop redness or irritation at the intravenous (IV) site. SEEK IMMEDIATE MEDICAL CARE IF:  Any of the following symptoms occur over the next 12 hours: Shaking chills. You have a temperature by mouth above 102 F (38.9 C), not controlled by medicine. Chest, back, or muscle pain. People around you feel you are not acting correctly or are confused. Shortness of breath or difficulty breathing. Dizziness and fainting. You get a rash or develop hives. You have a decrease in urine output. Your urine turns a dark color or changes to pink, red, or [place]. Any of the following symptoms occur over the next 10 days: You have a temperature by mouth above 102 F (38.9 C), not controlled by medicine. Shortness of breath. Weakness after normal activity. The  white part of the eye turns yellow (  jaundice). You have a decrease in the amount of urine or are urinating less often. Your urine turns a dark color or changes to pink, red, or [place]. Document Released: 09/04/2000 Document Revised: 11/30/2011 Document Reviewed: 04/23/2008 [facility] Patient Information 2014 [place], [place].  _______________________________________________________________________          If you would like to see a video about joint replacement:   [email or web address]

## 2024-06-08 NOTE — Progress Notes (Signed)
 COVID Vaccine received:  []  No [x]  Yes Date of any COVID positive Test in last 90 days:  PCP -  Elsie Loreli Raddle, MD  [phone]  Cardiologist -   Chest x-ray -  EKG -  2021   will repeat  Stress Test -  ECHO -  Cardiac Cath -  CT Coronary Calcium  score: 0  on 10-16-2020  Pacemaker / ICD device [x]  No []  Yes   Spinal Cord Stimulator:[x]  No []  Yes       History of Sleep Apnea? [x]  No []  Yes   CPAP used?- [x]  No []  Yes    Patient has: [x]  NO Hx DM   []  Pre-DM   []  DM1  []   DM2 Does the patient monitor blood sugar?   [x]  N/A   []  No []  Yes  Last A1c was:  5.4 normal on    01-31-2024   Blood Thinner / Instructions:none Aspirin Instructions:    Dental hx: []  Dentures:  []  N/A      []  Bridge or Partial:                   []  Loose or Damaged teeth:   Comments:   Activity level: Able to walk up 2 flights of stairs without becoming significantly short of breath or having chest pain?  []  No   []    Yes  Patient can perform ADLs without assistance. 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  Patient verbalized understanding and agreement to the Pre-Surgical Instructions that were given to them at this PAT appointment. Patient was also educated of the need to review these PAT instructions again prior to her surgery.I reviewed the appropriate phone numbers to call if they have any and questions or concerns.

## 2024-06-09 ENCOUNTER — Other Ambulatory Visit: Payer: Self-pay

## 2024-06-09 ENCOUNTER — Encounter (HOSPITAL_COMMUNITY): Payer: Self-pay

## 2024-06-09 ENCOUNTER — Encounter (HOSPITAL_COMMUNITY)
Admission: RE | Admit: 2024-06-09 | Discharge: 2024-06-09 | Disposition: A | Discharge status: Home / Self Care | Source: Ambulatory Visit | Attending: Orthopaedic Surgery | Admitting: Orthopaedic Surgery

## 2024-06-09 VITALS — BP 124/66 | HR 62 | Temp 98.7°F | Resp 16 | Ht 66.5 in | Wt 184.0 lb

## 2024-06-09 DIAGNOSIS — Z01818 Encounter for other preprocedural examination: Secondary | ICD-10-CM | POA: Insufficient documentation

## 2024-06-09 DIAGNOSIS — R001 Bradycardia, unspecified: Secondary | ICD-10-CM | POA: Diagnosis not present

## 2024-06-09 DIAGNOSIS — M1712 Unilateral primary osteoarthritis, left knee: Secondary | ICD-10-CM | POA: Diagnosis not present

## 2024-06-09 DIAGNOSIS — I10 Essential (primary) hypertension: Secondary | ICD-10-CM | POA: Insufficient documentation

## 2024-06-09 DIAGNOSIS — T505X5S Adverse effect of appetite depressants, sequela: Secondary | ICD-10-CM | POA: Diagnosis not present

## 2024-06-09 HISTORY — DX: Lymphedema, not elsewhere classified: I89.0

## 2024-06-09 HISTORY — DX: Unspecified osteoarthritis, unspecified site: M19.90

## 2024-06-09 HISTORY — DX: Depression, unspecified: F32.A

## 2024-06-09 LAB — COMPREHENSIVE METABOLIC PANEL WITH GFR
ALT: 18 U/L (ref 0–44)
AST: 21 U/L (ref 15–41)
Albumin: 4.4 g/dL (ref 3.5–5.0)
Alkaline Phosphatase: 77 U/L (ref 38–126)
Anion gap: 14 (ref 5–15)
BUN: 13 mg/dL (ref 8–23)
CO2: 23 mmol/L (ref 22–32)
Calcium: 9.8 mg/dL (ref 8.9–10.3)
Chloride: 97 mmol/L — ABNORMAL LOW (ref 98–111)
Creatinine, Ser: 0.61 mg/dL (ref 0.44–1.00)
GFR, Estimated: >60 mL/min (ref >60)
Glucose, Bld: 91 mg/dL (ref 70–99)
Potassium: 4.2 mmol/L (ref 3.5–5.1)
Sodium: 133 mmol/L — ABNORMAL LOW (ref 135–145)
Total Bilirubin: 1.1 mg/dL (ref 0.0–1.2)
Total Protein: 6.7 g/dL (ref 6.5–8.1)

## 2024-06-09 LAB — CBC
HCT: 40.2 % (ref 36.0–46.0)
Hemoglobin: 13 g/dL (ref 12.0–15.0)
MCH: 30.4 pg (ref 26.0–34.0)
MCHC: 32.3 g/dL (ref 30.0–36.0)
MCV: 94.1 fL (ref 80.0–100.0)
Platelets: 230 K/uL (ref 150–400)
RBC: 4.27 MIL/uL (ref 3.87–5.11)
RDW: 12.7 % (ref 11.5–15.5)
WBC: 5.4 K/uL (ref 4.0–10.5)
nRBC: 0 % (ref 0.0–0.2)

## 2024-06-09 LAB — SURGICAL PCR SCREEN
MRSA, PCR: NEGATIVE
Staphylococcus aureus: NEGATIVE

## 2024-06-09 NOTE — Progress Notes (Signed)
 Patient's T&S came back with Positive antibody. She had an Umbilical herniorrhaphy on  04-21-2024 by Dr. Lynda Leos, MD.  A new T&S will be drawn DOS. This note was sent to Dr. Vernetta and Bertrum Gaskins, PA

## 2024-06-12 ENCOUNTER — Telehealth: Payer: Self-pay | Admitting: *Deleted

## 2024-06-12 NOTE — Telephone Encounter (Signed)
 Ordered through [facility].

## 2024-06-12 NOTE — Telephone Encounter (Signed)
 Did you want a CPM for this patient due to the Lymphedema?

## 2024-06-14 ENCOUNTER — Other Ambulatory Visit: Payer: Self-pay | Admitting: *Deleted

## 2024-06-14 DIAGNOSIS — M1712 Unilateral primary osteoarthritis, left knee: Secondary | ICD-10-CM

## 2024-06-15 NOTE — H&P (Signed)
 TOTAL KNEE ADMISSION H&P  Patient is being admitted for left total knee arthroplasty.  Subjective:  Chief Complaint:left knee pain.  HPI: Melissa Morrow, 68 y.o. female, has a history of pain and functional disability in the left knee due to arthritis and has failed non-surgical conservative treatments for greater than 12 weeks to includeNSAID's and/or analgesics, corticosteriod injections, viscosupplementation injections, use of assistive devices, weight reduction as appropriate, and activity modification.  Onset of symptoms was gradual, starting several years ago with gradually worsening course since that time. The patient noted no past surgery on the left knee(s).  Patient currently rates pain in the left knee(s) at 10 out of 10 with activity. Patient has night pain, worsening of pain with activity and weight bearing, pain that interferes with activities of daily living, pain with passive range of motion, crepitus, and joint swelling.  Patient has evidence of subchondral sclerosis, periarticular osteophytes, and joint space narrowing by imaging studies. There is no active infection.  Patient Active Problem List   Diagnosis Date Noted   Unilateral primary osteoarthritis, left knee 03/15/2023   High blood pressure 03/11/2011   Hearing loss 03/11/2011   Contact lens/glasses fitting 03/11/2011   Menopause 03/11/2011   Family history of breast cancer 03/11/2011   Past Medical History:  Diagnosis Date   Arthritis    Depression    DVT (deep venous thrombosis) (HCC)    Confirm location with patient.   Hypertension    Mild   Lymphedema    LLE   Obesity     Past Surgical History:  Procedure Laterality Date   APPENDECTOMY  09/21/1969   BRAIN SURGERY  09/2010 and 10/2010   Crainotomy for acoustic neuroma & csf leak   BUNIONECTOMY  1987 and 1996   CESAREAN SECTION  1989 and 1991   DILATION AND CURETTAGE OF UTERUS  1988   GALLBLADDER SURGERY  09/21/2002   laparoscopic   HERNIA REPAIR   04/21/2024   umbilical hernia   Placement of Greenfield Filter  09/21/1986    No current facility-administered medications for this encounter.   Current Outpatient Medications  Medication Sig Dispense Refill Last Dose/Taking   buPROPion  ER (WELLBUTRIN  SR) 100 MG 12 hr tablet Take 1 tablet (100 mg total) by mouth in the morning and at noon. 180 tablet 3 Taking   diphenhydramine -acetaminophen  (TYLENOL  PM) 25-500 MG TABS tablet Take 1 tablet by mouth at bedtime as needed (pain/sleep).   Taking As Needed   ergocalciferol  (VITAMIN D2) 1.25 MG (50000 UT) capsule Take 1 capsule (50,000 Units total) by mouth once a week. 12 capsule 2 Taking   estradiol  (ESTRACE ) 0.1 MG/GM vaginal cream Place a blueberry sized drop of cream into the vagina twice weekly 42.5 g 1 Taking   folic acid  (FOLVITE ) 1 MG tablet Take 1 tablet (1 mg total) by mouth daily. 90 tablet 3 Taking   hydrochlorothiazide  (MICROZIDE ) 12.5 MG capsule Take 1 capsule (12.5 mg total) by mouth in the morning. 90 capsule 2 Taking   Ibuprofen-diphenhydrAMINE  HCl (ADVIL PM) 200-25 MG CAPS Take 1 capsule by mouth at bedtime as needed (pain/sleep).   Taking As Needed   Multiple Vitamin (MULTIVITAMIN) tablet Take 1 tablet by mouth daily.     Taking   rosuvastatin  (CRESTOR ) 10 MG tablet Take 1 tablet (10 mg total) by mouth daily. 90 tablet 3 Taking   tazarotene  (TAZORAC ) 0.1 % gel Apply to involved nail at bedtime. 30 g 1 Taking   COVID-19 mRNA bivalent vaccine, Pfizer, (PFIZER  COVID-19 VAC BIVALENT) injection Inject into the muscle. 0.3 mL 0    COVID-19 mRNA Vac-TriS, Pfizer, (PFIZER-BIONT COVID-19 VAC-TRIS) SUSP injection Inject into the muscle. 0.3 mL 0    RSV vaccine recomb adjuvanted (AREXVY ) 120 MCG/0.5ML injection Inject into the muscle. 0.5 mL 0    Zoster Vaccine Adjuvanted (SHINGRIX ) injection Inject into the muscle. 0.5 mL 1    Allergies  Allergen Reactions   Gadolinium Derivatives Nausea And Vomiting    Pt began immed vomiting post imj  of multihance    Ace Inhibitors Other (See Comments)    Angioedema     Social History   Tobacco Use   Smoking status: Never   Smokeless tobacco: Never  Substance Use Topics   Alcohol  use: Yes    Comment: 1-2 glasses of wine per week.    Family History  Problem Relation Age of Onset   Heart disease Father        Acute MI   Breast cancer Maternal Grandmother      Review of Systems  Objective:  Physical Exam Constitutional:      Appearance: Normal appearance.  HENT:     Head: Normocephalic and atraumatic.  Eyes:     Extraocular Movements: Extraocular movements intact.     Pupils: Pupils are equal, round, and reactive to light.  Cardiovascular:     Rate and Rhythm: Normal rate and regular rhythm.  Pulmonary:     Effort: Pulmonary effort is normal.     Breath sounds: Normal breath sounds.  Abdominal:     Palpations: Abdomen is soft.  Musculoskeletal:     Cervical back: Normal range of motion and neck supple.     Left knee: Effusion, bony tenderness and crepitus present. Decreased range of motion. Tenderness present over the medial joint line and lateral joint line. Abnormal alignment.  Neurological:     Mental Status: She is alert and oriented to person, place, and time.  Psychiatric:        Behavior: Behavior normal.   She does have significant lymphedema of her left lower extremity.  Vital signs in last 24 hours:    Labs:   Estimated body mass index is 29.25 kg/m as calculated from the following:   Height as of 06/09/24: 5' 6.5 (1.689 m).   Weight as of 06/09/24: 83.5 kg.   Imaging Review Plain radiographs demonstrate severe degenerative joint disease of the left knee(s). The overall alignment ismild valgus. The bone quality appears to be good for age and reported activity level.      Assessment/Plan:  End stage arthritis, left knee   The patient history, physical examination, clinical judgment of the provider and imaging studies are consistent with  end stage degenerative joint disease of the left knee(s) and total knee arthroplasty is deemed medically necessary. The treatment options including medical management, injection therapy arthroscopy and arthroplasty were discussed at length. The risks and benefits of total knee arthroplasty were presented and reviewed. The risks due to aseptic loosening, infection, stiffness, patella tracking problems, thromboembolic complications and other imponderables were discussed. The patient acknowledged the explanation, agreed to proceed with the plan and consent was signed. Patient is being admitted for inpatient treatment for surgery, pain control, PT, OT, prophylactic antibiotics, VTE prophylaxis, progressive ambulation and ADL's and discharge planning. The patient is planning to be discharged home with home health services

## 2024-06-15 NOTE — Anesthesia Preprocedure Evaluation (Signed)
 Anesthesia Evaluation  Patient identified by MRN, date of birth, ID band Patient awake    Reviewed: Allergy & Precautions, NPO status , Patient's Chart, lab work & pertinent test results  Airway Mallampati: II  TM Distance: >3 FB Neck ROM: Full    Dental  (+) Teeth Intact, Dental Advisory Given   Pulmonary neg pulmonary ROS   Pulmonary exam normal breath sounds clear to auscultation       Cardiovascular hypertension (127/70), Pt. on medications + DVT (S/P greenfield filter 1988)  Normal cardiovascular exam Rhythm:Regular Rate:Normal  Echo 2011 - Left ventricle: The cavity size was normal. Wall thickness was     normal. Systolic function was normal. The estimated ejection     fraction was in the range of 60% to 65%. Wall motion was normal;     there were no regional wall motion abnormalities. Features are     consistent with a pseudonormal left ventricular filling pattern,     with concomitant abnormal relaxation and increased filling     pressure (grade 2 diastolic dysfunction). Doppler parameters are     consistent with elevated ventricular end-diastolic filling     pressure.   - Left atrium: The atrium was moderately dilated.   - Right atrium: The atrium was mildly dilated.   - Atrial septum: Very prominent and mobileinter-atrial septal     aneurysm.     Neuro/Psych  PSYCHIATRIC DISORDERS  Depression    negative neurological ROS     GI/Hepatic negative GI ROS, Neg liver ROS,,,  Endo/Other  negative endocrine ROS    Renal/GU negative Renal ROS  negative genitourinary   Musculoskeletal  (+) Arthritis , Osteoarthritis,    Abdominal   Peds  Hematology Hb 13, plt 230   Anesthesia Other Findings   Reproductive/Obstetrics negative OB ROS                              Anesthesia Physical Anesthesia Plan  ASA: 2  Anesthesia Plan: Spinal, MAC and Regional   Post-op Pain Management:  Regional block* and Tylenol  PO (pre-op)*   Induction:   PONV Risk Score and Plan: 2 and Propofol  infusion, TIVA, Ondansetron  and Midazolam   Airway Management Planned: Natural Airway and Nasal Cannula  Additional Equipment: None  Intra-op Plan:   Post-operative Plan:   Informed Consent: I have reviewed the patients History and Physical, chart, labs and discussed the procedure including the risks, benefits and alternatives for the proposed anesthesia with the patient or authorized representative who has indicated his/her understanding and acceptance.       Plan Discussed with: CRNA  Anesthesia Plan Comments:          Anesthesia Quick Evaluation

## 2024-06-16 ENCOUNTER — Encounter (HOSPITAL_COMMUNITY)
Admission: RE | Disposition: A | Discharge status: Home / Self Care | Payer: Self-pay | Source: Home / Self Care | Attending: Orthopaedic Surgery

## 2024-06-16 ENCOUNTER — Other Ambulatory Visit: Payer: Self-pay

## 2024-06-16 ENCOUNTER — Encounter (HOSPITAL_COMMUNITY): Payer: Self-pay | Admitting: Orthopaedic Surgery

## 2024-06-16 ENCOUNTER — Ambulatory Visit (HOSPITAL_COMMUNITY): Payer: Self-pay | Admitting: Physician Assistant

## 2024-06-16 ENCOUNTER — Ambulatory Visit (HOSPITAL_BASED_OUTPATIENT_CLINIC_OR_DEPARTMENT_OTHER): Payer: Self-pay | Admitting: Anesthesiology

## 2024-06-16 ENCOUNTER — Observation Stay (HOSPITAL_COMMUNITY)
Admission: RE | Admit: 2024-06-16 | Discharge: 2024-06-17 | Disposition: A | Discharge status: Home / Self Care | Attending: Orthopaedic Surgery | Admitting: Orthopaedic Surgery

## 2024-06-16 ENCOUNTER — Observation Stay (HOSPITAL_COMMUNITY)

## 2024-06-16 DIAGNOSIS — M1712 Unilateral primary osteoarthritis, left knee: Secondary | ICD-10-CM

## 2024-06-16 DIAGNOSIS — Z7901 Long term (current) use of anticoagulants: Secondary | ICD-10-CM | POA: Diagnosis not present

## 2024-06-16 DIAGNOSIS — I10 Essential (primary) hypertension: Secondary | ICD-10-CM | POA: Diagnosis not present

## 2024-06-16 DIAGNOSIS — Z96652 Presence of left artificial knee joint: Secondary | ICD-10-CM

## 2024-06-16 DIAGNOSIS — M25562 Pain in left knee: Secondary | ICD-10-CM | POA: Diagnosis present

## 2024-06-16 DIAGNOSIS — Z79899 Other long term (current) drug therapy: Secondary | ICD-10-CM | POA: Insufficient documentation

## 2024-06-16 DIAGNOSIS — Z01818 Encounter for other preprocedural examination: Secondary | ICD-10-CM

## 2024-06-16 HISTORY — PX: TOTAL KNEE ARTHROPLASTY: SHX125

## 2024-06-16 LAB — TYPE AND SCREEN
ABO/RH(D): O POS
Antibody Screen: POSITIVE
PT AG Type: NEGATIVE
Unit division: 0
Unit division: 0

## 2024-06-16 LAB — BPAM RBC
Blood Product Expiration Date: 202509282359
Blood Product Expiration Date: 202509282359
Unit Type and Rh: 5100
Unit Type and Rh: 5100

## 2024-06-16 SURGERY — ARTHROPLASTY, KNEE, TOTAL
Anesthesia: Monitor Anesthesia Care | Site: Knee | Laterality: Left

## 2024-06-16 MED ORDER — ACETAMINOPHEN 500 MG PO TABS
1000.0000 mg | ORAL_TABLET | Freq: Once | ORAL | Status: AC
  Administered 2024-06-16: 1000 mg via ORAL
  Filled 2024-06-16: qty 2

## 2024-06-16 MED ORDER — EPHEDRINE SULFATE-NACL 50-0.9 MG/10ML-% IV SOSY
PREFILLED_SYRINGE | INTRAVENOUS | Status: DC | PRN
  Administered 2024-06-16 (×2): 5 mg via INTRAVENOUS

## 2024-06-16 MED ORDER — STERILE WATER FOR IRRIGATION IR SOLN
Status: DC | PRN
  Administered 2024-06-16: 2000 mL

## 2024-06-16 MED ORDER — LIDOCAINE HCL (PF) 2 % IJ SOLN
INTRAMUSCULAR | Status: DC | PRN
  Administered 2024-06-16: 20 mg via INTRADERMAL

## 2024-06-16 MED ORDER — DEXAMETHASONE SODIUM PHOSPHATE 10 MG/ML IJ SOLN
INTRAMUSCULAR | Status: AC
  Filled 2024-06-16: qty 1

## 2024-06-16 MED ORDER — CHLORHEXIDINE GLUCONATE 0.12 % MT SOLN
15.0000 mL | Freq: Once | OROMUCOSAL | Status: AC
  Administered 2024-06-16: 15 mL via OROMUCOSAL

## 2024-06-16 MED ORDER — CEFAZOLIN SODIUM-DEXTROSE 2-4 GM/100ML-% IV SOLN
2.0000 g | Freq: Four times a day (QID) | INTRAVENOUS | Status: AC
  Administered 2024-06-16 (×2): 2 g via INTRAVENOUS
  Filled 2024-06-16 (×2): qty 100

## 2024-06-16 MED ORDER — ONDANSETRON HCL 4 MG/2ML IJ SOLN
INTRAMUSCULAR | Status: AC
  Filled 2024-06-16: qty 2

## 2024-06-16 MED ORDER — PHENYLEPHRINE HCL-NACL 20-0.9 MG/250ML-% IV SOLN
INTRAVENOUS | Status: AC
  Filled 2024-06-16: qty 250

## 2024-06-16 MED ORDER — METHOCARBAMOL 500 MG PO TABS
500.0000 mg | ORAL_TABLET | Freq: Four times a day (QID) | ORAL | Status: DC | PRN
  Administered 2024-06-16 – 2024-06-17 (×3): 500 mg via ORAL
  Filled 2024-06-16 (×3): qty 1

## 2024-06-16 MED ORDER — MENTHOL 3 MG MT LOZG: 1.0000 | LOZENGE | OROMUCOSAL | Status: DC | PRN

## 2024-06-16 MED ORDER — OXYCODONE HCL 5 MG PO TABS
5.0000 mg | ORAL_TABLET | ORAL | Status: DC | PRN
  Administered 2024-06-16 (×3): 5 mg via ORAL
  Administered 2024-06-17 (×3): 10 mg via ORAL
  Filled 2024-06-16: qty 1
  Filled 2024-06-16: qty 2
  Filled 2024-06-16: qty 1
  Filled 2024-06-16 (×2): qty 2
  Filled 2024-06-16: qty 1

## 2024-06-16 MED ORDER — DEXAMETHASONE SODIUM PHOSPHATE 10 MG/ML IJ SOLN
10.0000 mg | Freq: Once | INTRAMUSCULAR | Status: AC
  Administered 2024-06-17: 10 mg via INTRAVENOUS
  Filled 2024-06-16: qty 1

## 2024-06-16 MED ORDER — BUPROPION HCL ER (SR) 100 MG PO TB12
100.0000 mg | ORAL_TABLET | Freq: Two times a day (BID) | ORAL | Status: DC
  Administered 2024-06-16 – 2024-06-17 (×2): 100 mg via ORAL
  Filled 2024-06-16 (×2): qty 1

## 2024-06-16 MED ORDER — FOLIC ACID 1 MG PO TABS
1.0000 mg | ORAL_TABLET | Freq: Every day | ORAL | Status: DC
  Administered 2024-06-17: 1 mg via ORAL
  Filled 2024-06-16: qty 1

## 2024-06-16 MED ORDER — FENTANYL CITRATE PF 50 MCG/ML IJ SOSY: 50.0000 ug | PREFILLED_SYRINGE | Freq: Once | INTRAMUSCULAR | Status: AC

## 2024-06-16 MED ORDER — POVIDONE-IODINE 10 % EX SWAB
2.0000 | Freq: Once | CUTANEOUS | Status: AC
  Administered 2024-06-16: 2 via TOPICAL

## 2024-06-16 MED ORDER — ORAL CARE MOUTH RINSE: 15.0000 mL | Freq: Once | OROMUCOSAL | Status: AC

## 2024-06-16 MED ORDER — OXYCODONE HCL 5 MG/5ML PO SOLN: 5.0000 mg | Freq: Once | ORAL | Status: DC | PRN

## 2024-06-16 MED ORDER — DIPHENHYDRAMINE HCL 12.5 MG/5ML PO ELIX: 12.5000 mg | ORAL_SOLUTION | ORAL | Status: DC | PRN

## 2024-06-16 MED ORDER — APIXABAN 2.5 MG PO TABS
2.5000 mg | ORAL_TABLET | Freq: Two times a day (BID) | ORAL | Status: DC
  Administered 2024-06-17: 2.5 mg via ORAL
  Filled 2024-06-16: qty 1

## 2024-06-16 MED ORDER — BUPIVACAINE IN DEXTROSE 0.75-8.25 % IT SOLN
INTRATHECAL | Status: DC | PRN
  Administered 2024-06-16: 1.6 mL via INTRATHECAL

## 2024-06-16 MED ORDER — ONDANSETRON HCL 4 MG/2ML IJ SOLN
INTRAMUSCULAR | Status: DC | PRN
Start: 2024-06-16 — End: 2024-06-16
  Administered 2024-06-16: 4 mg via INTRAVENOUS

## 2024-06-16 MED ORDER — PHENOL 1.4 % MT LIQD: 1.0000 | OROMUCOSAL | Status: DC | PRN

## 2024-06-16 MED ORDER — HYDROMORPHONE HCL 1 MG/ML IJ SOLN
0.5000 mg | INTRAMUSCULAR | Status: DC | PRN
  Administered 2024-06-16 – 2024-06-17 (×2): 1 mg via INTRAVENOUS
  Filled 2024-06-16 (×2): qty 1

## 2024-06-16 MED ORDER — MIDAZOLAM HCL 2 MG/2ML IJ SOLN
INTRAMUSCULAR | Status: AC
  Administered 2024-06-16: 2 mg via INTRAVENOUS
  Filled 2024-06-16: qty 2

## 2024-06-16 MED ORDER — BUPIVACAINE-EPINEPHRINE 0.25% -1:200000 IJ SOLN
INTRAMUSCULAR | Status: DC | PRN
  Administered 2024-06-16: 30 mL

## 2024-06-16 MED ORDER — SODIUM CHLORIDE 0.9 % IR SOLN
Status: DC | PRN
  Administered 2024-06-16: 1000 mL

## 2024-06-16 MED ORDER — HYDROCHLOROTHIAZIDE 12.5 MG PO TABS
12.5000 mg | ORAL_TABLET | Freq: Every day | ORAL | Status: DC
  Filled 2024-06-16 (×2): qty 1

## 2024-06-16 MED ORDER — PHENYLEPHRINE HCL-NACL 20-0.9 MG/250ML-% IV SOLN
INTRAVENOUS | Status: DC | PRN
  Administered 2024-06-16: 25 ug/min via INTRAVENOUS

## 2024-06-16 MED ORDER — DEXAMETHASONE SODIUM PHOSPHATE 10 MG/ML IJ SOLN
INTRAMUSCULAR | Status: DC | PRN
  Administered 2024-06-16: 10 mg via PERINEURAL

## 2024-06-16 MED ORDER — OXYCODONE HCL 5 MG PO TABS
10.0000 mg | ORAL_TABLET | ORAL | Status: DC | PRN
  Administered 2024-06-16: 10 mg via ORAL
  Filled 2024-06-16: qty 2

## 2024-06-16 MED ORDER — CEFAZOLIN SODIUM-DEXTROSE 2-4 GM/100ML-% IV SOLN
2.0000 g | INTRAVENOUS | Status: AC
  Administered 2024-06-16: 2 g via INTRAVENOUS
  Filled 2024-06-16: qty 100

## 2024-06-16 MED ORDER — PANTOPRAZOLE SODIUM 40 MG PO TBEC
40.0000 mg | DELAYED_RELEASE_TABLET | Freq: Every day | ORAL | Status: DC
  Administered 2024-06-16 – 2024-06-17 (×2): 40 mg via ORAL
  Filled 2024-06-16 (×2): qty 1

## 2024-06-16 MED ORDER — FENTANYL CITRATE PF 50 MCG/ML IJ SOSY
PREFILLED_SYRINGE | INTRAMUSCULAR | Status: AC
  Administered 2024-06-16: 100 ug via INTRAVENOUS
  Filled 2024-06-16: qty 2

## 2024-06-16 MED ORDER — HYDROMORPHONE HCL 1 MG/ML IJ SOLN: 0.2500 mg | INTRAMUSCULAR | Status: DC | PRN

## 2024-06-16 MED ORDER — SODIUM CHLORIDE 0.9 % IV SOLN: INTRAVENOUS | Status: AC

## 2024-06-16 MED ORDER — OXYCODONE HCL 5 MG PO TABS: 5.0000 mg | ORAL_TABLET | Freq: Once | ORAL | Status: DC | PRN

## 2024-06-16 MED ORDER — PROPOFOL 10 MG/ML IV BOLUS
INTRAVENOUS | Status: DC | PRN
  Administered 2024-06-16: 10 mg via INTRAVENOUS
  Administered 2024-06-16 (×2): 20 mg via INTRAVENOUS

## 2024-06-16 MED ORDER — ALUM & MAG HYDROXIDE-SIMETH 200-200-20 MG/5ML PO SUSP: 30.0000 mL | ORAL | Status: DC | PRN

## 2024-06-16 MED ORDER — PROPOFOL 10 MG/ML IV BOLUS
INTRAVENOUS | Status: AC
  Filled 2024-06-16: qty 20

## 2024-06-16 MED ORDER — MIDAZOLAM HCL 2 MG/2ML IJ SOLN: 1.0000 mg | Freq: Once | INTRAMUSCULAR | Status: AC

## 2024-06-16 MED ORDER — LACTATED RINGERS IV SOLN
INTRAVENOUS | Status: DC
Start: 2024-06-16 — End: 2024-06-16

## 2024-06-16 MED ORDER — EPHEDRINE 5 MG/ML INJ
INTRAVENOUS | Status: AC
  Filled 2024-06-16: qty 5

## 2024-06-16 MED ORDER — ACETAMINOPHEN 325 MG PO TABS: 325.0000 mg | ORAL_TABLET | Freq: Four times a day (QID) | ORAL | Status: DC | PRN

## 2024-06-16 MED ORDER — ONDANSETRON HCL 4 MG/2ML IJ SOLN: 4.0000 mg | Freq: Once | INTRAMUSCULAR | Status: DC | PRN

## 2024-06-16 MED ORDER — ROPIVACAINE HCL 5 MG/ML IJ SOLN
INTRAMUSCULAR | Status: DC | PRN
  Administered 2024-06-16: 30 mL via PERINEURAL

## 2024-06-16 MED ORDER — 0.9 % SODIUM CHLORIDE (POUR BTL) OPTIME
TOPICAL | Status: DC | PRN
  Administered 2024-06-16: 1000 mL

## 2024-06-16 MED ORDER — TRANEXAMIC ACID-NACL 1000-0.7 MG/100ML-% IV SOLN
1000.0000 mg | INTRAVENOUS | Status: AC
  Administered 2024-06-16: 1000 mg via INTRAVENOUS
  Filled 2024-06-16: qty 100

## 2024-06-16 MED ORDER — KETOROLAC TROMETHAMINE 15 MG/ML IJ SOLN
7.5000 mg | Freq: Four times a day (QID) | INTRAMUSCULAR | Status: AC
  Administered 2024-06-16 – 2024-06-17 (×3): 7.5 mg via INTRAVENOUS
  Filled 2024-06-16 (×3): qty 1

## 2024-06-16 MED ORDER — LIDOCAINE HCL (PF) 2 % IJ SOLN
INTRAMUSCULAR | Status: AC
  Filled 2024-06-16: qty 5

## 2024-06-16 MED ORDER — PROPOFOL 500 MG/50ML IV EMUL
INTRAVENOUS | Status: AC
  Filled 2024-06-16: qty 50

## 2024-06-16 MED ORDER — ONDANSETRON HCL 4 MG PO TABS: 4.0000 mg | ORAL_TABLET | Freq: Four times a day (QID) | ORAL | Status: DC | PRN

## 2024-06-16 MED ORDER — METOCLOPRAMIDE HCL 5 MG/ML IJ SOLN: 5.0000 mg | Freq: Three times a day (TID) | INTRAMUSCULAR | Status: DC | PRN

## 2024-06-16 MED ORDER — BUPIVACAINE-EPINEPHRINE (PF) 0.25% -1:200000 IJ SOLN
INTRAMUSCULAR | Status: AC
  Filled 2024-06-16: qty 30

## 2024-06-16 MED ORDER — DOCUSATE SODIUM 100 MG PO CAPS
100.0000 mg | ORAL_CAPSULE | Freq: Two times a day (BID) | ORAL | Status: DC
Start: 2024-06-16 — End: 2024-06-17
  Administered 2024-06-16 – 2024-06-17 (×2): 100 mg via ORAL
  Filled 2024-06-16 (×2): qty 1

## 2024-06-16 MED ORDER — PROPOFOL 500 MG/50ML IV EMUL
INTRAVENOUS | Status: DC | PRN
  Administered 2024-06-16: 75 ug/kg/min via INTRAVENOUS

## 2024-06-16 MED ORDER — ONDANSETRON HCL 4 MG/2ML IJ SOLN
4.0000 mg | Freq: Four times a day (QID) | INTRAMUSCULAR | Status: DC | PRN
  Administered 2024-06-16 – 2024-06-17 (×2): 4 mg via INTRAVENOUS
  Filled 2024-06-16 (×2): qty 2

## 2024-06-16 MED ORDER — METHOCARBAMOL 1000 MG/10ML IJ SOLN: 500.0000 mg | Freq: Four times a day (QID) | INTRAMUSCULAR | Status: DC | PRN

## 2024-06-16 MED ORDER — POLYETHYLENE GLYCOL 3350 17 G PO PACK: 17.0000 g | PACK | Freq: Every day | ORAL | Status: DC | PRN

## 2024-06-16 MED ORDER — METOCLOPRAMIDE HCL 5 MG PO TABS: 5.0000 mg | ORAL_TABLET | Freq: Three times a day (TID) | ORAL | Status: DC | PRN

## 2024-06-16 MED ORDER — AMISULPRIDE (ANTIEMETIC) 5 MG/2ML IV SOLN: 10.0000 mg | Freq: Once | INTRAVENOUS | Status: DC | PRN

## 2024-06-16 SURGICAL SUPPLY — 50 items
BAG COUNTER SPONGE SURGICOUNT (BAG) IMPLANT
BAG ZIPLOCK 12X15 (MISCELLANEOUS) ×1 IMPLANT
BENZOIN TINCTURE PRP APPL 2/3 (GAUZE/BANDAGES/DRESSINGS) IMPLANT
BLADE SAG 18X100X1.27 (BLADE) ×1 IMPLANT
BNDG ELASTIC 6INX 5YD STR LF (GAUZE/BANDAGES/DRESSINGS) ×2 IMPLANT
BOWL SMART MIX CTS (DISPOSABLE) IMPLANT
CEMENT BONE R 1X40 (Cement) IMPLANT
COMPONENT FEM CEMT PRNSA SZ9LT (Knees) IMPLANT
COMPONET TIB PS KNEE E 0D LT (Joint) IMPLANT
COOLER ICEMAN CLASSIC (MISCELLANEOUS) ×1 IMPLANT
COVER SURGICAL LIGHT HANDLE (MISCELLANEOUS) ×1 IMPLANT
CUFF TRNQT CYL 34X4.125X (TOURNIQUET CUFF) ×1 IMPLANT
DRAPE U-SHAPE 47X51 STRL (DRAPES) ×1 IMPLANT
DURAPREP 26ML APPLICATOR (WOUND CARE) ×1 IMPLANT
ELECT BLADE TIP CTD 4 INCH (ELECTRODE) ×1 IMPLANT
ELECT PENCIL ROCKER SW 15FT (MISCELLANEOUS) ×1 IMPLANT
ELECT REM PT RETURN 15FT ADLT (MISCELLANEOUS) ×1 IMPLANT
GAUZE PAD ABD 8X10 STRL (GAUZE/BANDAGES/DRESSINGS) ×2 IMPLANT
GAUZE SPONGE 4X4 12PLY STRL (GAUZE/BANDAGES/DRESSINGS) ×1 IMPLANT
GAUZE XEROFORM 1X8 LF (GAUZE/BANDAGES/DRESSINGS) IMPLANT
GLOVE BIO SURGEON STRL SZ7.5 (GLOVE) ×1 IMPLANT
GLOVE BIOGEL PI IND STRL 8 (GLOVE) ×2 IMPLANT
GLOVE ECLIPSE 8.0 STRL XLNG CF (GLOVE) ×1 IMPLANT
GOWN STRL REUS W/ TWL XL LVL3 (GOWN DISPOSABLE) ×2 IMPLANT
HOLDER FOLEY CATH W/STRAP (MISCELLANEOUS) IMPLANT
IMMOBILIZER KNEE 20 THIGH 36 (SOFTGOODS) ×1 IMPLANT
KIT TURNOVER KIT A (KITS) ×1 IMPLANT
MANIFOLD NEPTUNE II (INSTRUMENTS) ×1 IMPLANT
NS IRRIG 1000ML POUR BTL (IV SOLUTION) ×1 IMPLANT
PACK TOTAL KNEE CUSTOM (KITS) ×1 IMPLANT
PAD COLD SHLDR WRAP-ON (PAD) ×1 IMPLANT
PADDING CAST COTTON 6X4 STRL (CAST SUPPLIES) ×2 IMPLANT
PIN DRILL HDLS TROCAR 75 4PK (PIN) IMPLANT
PROTECTOR NERVE ULNAR (MISCELLANEOUS) ×1 IMPLANT
SCREW FEMALE HEX FIX 25X2.5 (ORTHOPEDIC DISPOSABLE SUPPLIES) IMPLANT
SET HNDPC FAN SPRY TIP SCT (DISPOSABLE) ×1 IMPLANT
SET PAD KNEE POSITIONER (MISCELLANEOUS) ×1 IMPLANT
SPIKE FLUID TRANSFER (MISCELLANEOUS) IMPLANT
STAPLER SKIN PROX 35W (STAPLE) IMPLANT
STEM ARTISURF EF 12 SZ8-11 (Stem) IMPLANT
STEM POLY PAT PLY 35M KNEE (Knees) IMPLANT
STRIP CLOSURE SKIN 1/2X4 (GAUZE/BANDAGES/DRESSINGS) IMPLANT
SUT MNCRL AB 4-0 PS2 18 (SUTURE) IMPLANT
SUT VIC AB 0 CT1 36 (SUTURE) ×1 IMPLANT
SUT VIC AB 1 CT1 36 (SUTURE) ×2 IMPLANT
SUT VIC AB 2-0 CT1 TAPERPNT 27 (SUTURE) ×2 IMPLANT
TOWEL GREEN STERILE FF (TOWEL DISPOSABLE) ×1 IMPLANT
TRAY FOLEY MTR SLVR 16FR STAT (SET/KITS/TRAYS/PACK) IMPLANT
WATER STERILE IRR 1000ML POUR (IV SOLUTION) ×2 IMPLANT
YANKAUER SUCT BULB TIP NO VENT (SUCTIONS) ×1 IMPLANT

## 2024-06-16 NOTE — Interval H&P Note (Signed)
 History and Physical Interval Note: The patient understands that she is here today for a left total knee replacement to treat her significant left knee pain and arthritis.  There has been no acute or interval change in her medical status.  The risks and benefits of surgery have been discussed in detail and informed consent has been obtained.  The left operative knee has been marked.  06/16/2024 7:05 AM  Melissa Morrow  has presented today for surgery, with the diagnosis of left knee osteoarthritis.  The various methods of treatment have been discussed with the patient and family. After consideration of risks, benefits and other options for treatment, the patient has consented to  Procedure(s): ARTHROPLASTY, KNEE, TOTAL (Left) as a surgical intervention.  The patient's history has been reviewed, patient examined, no change in status, stable for surgery.  I have reviewed the patient's chart and labs.  Questions were answered to the patient's satisfaction.     [name]

## 2024-06-16 NOTE — TOC Transition Note (Signed)
 Transition of Care [facility]) - Discharge Note   Patient Details  Name: Melissa Morrow MRN: [id number] Date of Birth: 1955-12-04  [facility] CM/SW Contact:  NORMAN ASPEN, LCSW Phone Number: 06/16/2024, 3:39 PM   Clinical Narrative:     Met with pt who confirms she has needed DME in the home.  Pt aware HHPT prearranged with [facility] via ortho MD office prior to surgery.  No further IP CM needs.  Final next level of care: Home w Home Health Services Barriers to Discharge: No Barriers Identified   Patient Goals and CMS Choice Patient states their goals for this hospitalization and ongoing recovery are:: return home          Discharge Placement                       Discharge Plan and Services Additional resources added to the After Visit Summary for                  DME Arranged: N/A DME Agency: NA       HH Arranged: PT HH Agency: [facility]        Social Drivers of Health (SDOH) Interventions SDOH Screenings   Food Insecurity: No Food Insecurity (06/16/2024)  Housing: Low Risk  (06/16/2024)  Transportation Needs: No Transportation Needs (06/16/2024)  Utilities: Not At Risk (06/16/2024)  Social Connections: Socially Integrated (06/16/2024)  Tobacco Use: Low Risk  (06/16/2024)     Readmission Risk Interventions     No data to display

## 2024-06-16 NOTE — Evaluation (Signed)
 Physical Therapy Evaluation Patient Details Name: Melissa Morrow MRN: [id number] DOB: 1956/01/22 Today's Date: 06/16/2024  History of Present Illness  Pt is 68 yo female admitted on 06/16/24 for L TKA.  Pt with hx including but not limited to arthritis, HTN, DVT, lymphedema, crainotomy for acoustic neuroma and CSF leak 2012  Clinical Impression  Pt is s/p TKA resulting in the deficits listed below (see PT Problem List). At baseline, pt active and independent.  She has home support and DME.  Today, pt reports increased pain but eager to mobilize. She has good quad activation and ROM. She was able to ambulate 68' with RW and CGA. Pt expected to progress well once pain controlled. Pt will benefit from acute skilled PT to increase their independence and safety with mobility to allow discharge.          If plan is discharge home, recommend the following: A little help with walking and/or transfers;A little help with bathing/dressing/bathroom;Assistance with cooking/housework;Help with stairs or ramp for entrance   Can travel by private vehicle        Equipment Recommendations None recommended by PT  Recommendations for Other Services       Functional Status Assessment Patient has had a recent decline in their functional status and demonstrates the ability to make significant improvements in function in a reasonable and predictable amount of time.     Precautions / Restrictions Precautions Precautions: Fall;Knee Restrictions Weight Bearing Restrictions Per Provider Order: Yes LLE Weight Bearing Per Provider Order: Weight bearing as tolerated      Mobility  Bed Mobility Overal bed mobility: Needs Assistance Bed Mobility: Supine to Sit     Supine to sit: Min assist          Transfers Overall transfer level: Needs assistance Equipment used: Rolling walker (2 wheels) Transfers: Sit to/from Stand Sit to Stand: Min assist           General transfer comment: cues for  hand placement; light min A to rise    Ambulation/Gait Ambulation/Gait assistance: Contact guard assist Gait Distance (Feet): 35 Feet Assistive device: Rolling walker (2 wheels) Gait Pattern/deviations: Step-to pattern, Decreased stride length, Decreased weight shift to left Gait velocity: decreased     General Gait Details: Cues for sequencing; chair follow safety; limited by pain  Stairs            Wheelchair Mobility     Tilt Bed    Modified Rankin (Stroke Patients Only)       Balance Overall balance assessment: Needs assistance Sitting-balance support: No upper extremity supported Sitting balance-Leahy Scale: Good     Standing balance support: Bilateral upper extremity supported, Reliant on assistive device for balance Standing balance-Leahy Scale: Poor                               Pertinent Vitals/Pain Pain Assessment Pain Assessment: 0-10 Pain Score: 8  Pain Location: L knee Pain Descriptors / Indicators: Discomfort, Sharp, Throbbing Pain Intervention(s): Limited activity within patient's tolerance, Monitored during session, Premedicated before session, Repositioned, Ice applied, Patient requesting pain meds-RN notified    Home Living Family/patient expects to be discharged to:: Private residence Living Arrangements: Spouse/significant other Available Help at Discharge: Family;Available 24 hours/day Type of Home: House Home Access: Stairs to enter Entrance Stairs-Rails: None Entrance Stairs-Number of Steps: 1   Home Layout: One level Home Equipment: Pharmacist, hospital (2 wheels)  Prior Function Prior Level of Function : Independent/Modified Independent;Driving                     Extremity/Trunk Assessment   Upper Extremity Assessment Upper Extremity Assessment: Overall WFL for tasks assessed    Lower Extremity Assessment Lower Extremity Assessment: LLE deficits/detail;RLE deficits/detail RLE Deficits /  Details: ROM WFL; MMT 5/5 LLE Deficits / Details: Expected post op changes; ROM Knee~ 5 to 80 degrees; MMT ankle 5/5, knee and hip 3/5 not further tested    Cervical / Trunk Assessment Cervical / Trunk Assessment: Normal  Communication        Cognition Arousal: Alert Behavior During Therapy: WFL for tasks assessed/performed   PT - Cognitive impairments: No apparent impairments                                 Cueing       General Comments General comments (skin integrity, edema, etc.): Pt had some c/o nausea and queasy after walking, improving in sitting  - BP was 96/65, notified RN.    Exercises     Assessment/Plan    PT Assessment Patient needs continued PT services  PT Problem List Decreased strength;Decreased mobility;Decreased range of motion;Decreased activity tolerance;Decreased balance;Decreased knowledge of use of DME;Pain       PT Treatment Interventions DME instruction;Therapeutic exercise;Gait training;Stair training;Functional mobility training;Therapeutic activities;Patient/family education;Modalities;Balance training    PT Goals (Current goals can be found in the Care Plan section)  Acute Rehab PT Goals Patient Stated Goal: return home PT Goal Formulation: With patient/family Time For Goal Achievement: 06/30/24 Potential to Achieve Goals: Good    Frequency 7X/week     Co-evaluation               AM-PAC PT 6 Clicks Mobility  Outcome Measure Help needed turning from your back to your side while in a flat bed without using bedrails?: A Little Help needed moving from lying on your back to sitting on the side of a flat bed without using bedrails?: A Little Help needed moving to and from a bed to a chair (including a wheelchair)?: A Little Help needed standing up from a chair using your arms (e.g., wheelchair or bedside chair)?: A Little Help needed to walk in hospital room?: A Little Help needed climbing 3-5 steps with a railing? : A  Little 6 Click Score: 18    End of Session Equipment Utilized During Treatment: Gait belt Activity Tolerance: Patient limited by pain Patient left: in chair;with call bell/phone within reach;with family/visitor present Nurse Communication: Mobility status;Patient requests pain meds PT Visit Diagnosis: Other abnormalities of gait and mobility (R26.89);Muscle weakness (generalized) (M62.81)    Time: 8585-8556 PT Time Calculation (min) (ACUTE ONLY): 29 min   Charges:   PT Evaluation $PT Eval Low Complexity: 1 Low PT Treatments $Gait Training: 8-22 mins PT General Charges $$ ACUTE PT VISIT: 1 Visit         Benjiman, PT Acute Rehab [facility] [phone]   Benjiman VEAR Mulberry 06/16/2024, 4:02 PM

## 2024-06-16 NOTE — Progress Notes (Signed)
 MEWS Progress Note  Patient Details Name: Melissa Morrow MRN: [id number] DOB: 1955-12-03 Today's Date: 06/16/2024   MEWS Flowsheet Documentation:  Assess: MEWS Score Temp: (!) 96.6 F (35.9 C) BP: 121/69 MAP (mmHg): 84 Pulse Rate: 61 ECG Heart Rate: 61 Resp: 16 Level of Consciousness: Alert SpO2: 99 % O2 Device: Room Air Patient Activity (if Appropriate): In bed O2 Flow Rate (L/min): 6 L/min Assess: MEWS Score MEWS Temp: 1 MEWS Systolic: 0 MEWS Pulse: 0 MEWS RR: 0 MEWS LOC: 0 MEWS Score: 1 MEWS Score Color: Green Assess: SIRS CRITERIA SIRS Temperature : 1 SIRS Respirations : 0 SIRS Pulse: 0 SIRS WBC: 0 SIRS Score Sum : 1 SIRS Temperature : 1 SIRS Pulse: 0 SIRS Respirations : 0 SIRS WBC: 0 SIRS Score Sum : 1 Assess: if the MEWS score is Yellow or Red Were vital signs accurate and taken at a resting state?: No, vital signs rechecked Does the patient meet 2 or more of the SIRS criteria?: No MEWS guidelines implemented :  (No, vitals taken upon admission, pt green MEWS)        [name] [name] 06/16/2024, 1:01 PM

## 2024-06-16 NOTE — Transfer of Care (Signed)
 Immediate Anesthesia Transfer of Care Note  Patient: Melissa Morrow  Procedure(s) Performed: ARTHROPLASTY, KNEE, TOTAL (Left: Knee)  Patient Location: PACU  Anesthesia Type:Spinal and MAC combined with regional for post-op pain  Level of Consciousness: awake, alert , oriented, and patient cooperative  Airway & Oxygen Therapy: Patient Spontanous Breathing and Patient connected to face mask oxygen  Post-op Assessment: Report given to RN and Post -op Vital signs reviewed and stable  Post vital signs: Reviewed and stable  Last Vitals:  Vitals Value Taken Time  BP 100/55 06/16/24 10:20  Temp    Pulse 66 06/16/24 10:23  Resp 14 06/16/24 10:23  SpO2 100 % 06/16/24 10:23  Vitals shown include unfiled device data.  Last Pain:  Vitals:   06/16/24 0811  TempSrc:   PainSc: 0-No pain         Complications: No notable events documented.

## 2024-06-16 NOTE — Anesthesia Procedure Notes (Signed)
 Procedure Name: MAC Date/Time: 06/16/2024 8:26 AM  Performed by: Dasie Nena PARAS, CRNAPre-anesthesia Checklist: Patient identified, Emergency Drugs available, Suction available, Patient being monitored and Timeout performed Oxygen Delivery Method: Simple face mask Placement Confirmation: positive ETCO2

## 2024-06-16 NOTE — Anesthesia Postprocedure Evaluation (Signed)
 Anesthesia Post Note  Patient: Melissa Morrow  Procedure(s) Performed: ARTHROPLASTY, KNEE, TOTAL (Left: Knee)     Patient location during evaluation: PACU Anesthesia Type: Regional, MAC and Spinal Level of consciousness: awake and alert and oriented Pain management: pain level controlled Vital Signs Assessment: post-procedure vital signs reviewed and stable Respiratory status: spontaneous breathing, nonlabored ventilation and respiratory function stable Cardiovascular status: blood pressure returned to baseline and stable Postop Assessment: no headache, no backache, spinal receding and no apparent nausea or vomiting Anesthetic complications: no   No notable events documented.  Last Vitals:  Vitals:   06/16/24 1045 06/16/24 1100  BP: 108/65 112/74  Pulse: 63 66  Resp: 10 17  Temp:    SpO2: 96% 99%    Last Pain:  Vitals:   06/16/24 1115  TempSrc:   PainSc: 0-No pain                 Almarie CHRISTELLA Marchi

## 2024-06-16 NOTE — Op Note (Signed)
 Operative Note  Date of operation: 06/16/2024 Preoperative diagnosis: Left knee primary osteoarthritis Postoperative diagnosis: Same  Procedure: Left cemented total knee arthroplasty  Implants: Biomet/Zimmer persona cemented knee system Implant Name Type Inv. Item Serial No. Manufacturer Lot No. LRB No. Used Action  CEMENT BONE R 1X40 - [id number] Cement CEMENT BONE R 1X40  ZIMMER RECON(ORTH,TRAU,BIO,SG) [id number] Left 2 Implanted  STEM ARTISURF EF 12 SZ8-11 - [id number] Stem STEM ARTISURF EF 12 SZ8-11  ZIMMER RECON(ORTH,TRAU,BIO,SG) [id number] Left 1 Implanted  STEM POLY PAT PLY 6M KNEE - [id number] Knees STEM POLY PAT PLY 6M KNEE  ZIMMER RECON(ORTH,TRAU,BIO,SG) [id number] Left 1 Implanted  COMPONET TIB PS KNEE E 0D LT - [id number] Joint COMPONET TIB PS KNEE E 0D LT  ZIMMER RECON(ORTH,TRAU,BIO,SG) [id number] Left 1 Implanted  COMPONENT FEM CEMT PRNSA SZ9LT - [id number] Knees COMPONENT FEM CEMT PRNSA SZ9LT  ZIMMER RECON(ORTH,TRAU,BIO,SG) [id number] Left 1 Implanted   Surgeon: Lonni GRADE. Vernetta, MD Assistant: Tory Gaskins, PA-C  Anesthesia: #1 left lower extremity adductor canal block, #2 spinal, #3 local Tourniquet time: Under 1 hour EBL: Less than 50 cc Antibiotics: IV Ancef  Complications: None  Indications: The patient is a 68 year old very active female with debilitating arthritis involving her left knee.  She has significant valgus malalignment of that knee with x-rays showing osteophytes in all her compartments and bone-on-bone wear.  She has tried and failed conservative treatment for a long period of time.  At this point her left knee pain is daily and it is detrimentally affecting her mobility, her quality of life and her actives daily living to the point she does wish to proceed with a knee replacement we agree with this as well.  We did discuss in length and detail the risks of acute blood loss anemia, nerve and vessel injury, fracture, infection, DVT, implant failure and wound  healing issues.  She understands that our goals are hopefully decreased pain, improved mobility and improved quality of life.  Procedure description: After informed consent was obtained and the appropriate left knee was marked, anesthesia obtained a left lower extremity adductor canal block in the holding room.  The patient was then brought to the operating room and set up on the operating table where spinal anesthesia was obtained.  She was then laid in supine position on the operating table and a Foley catheter was placed.  A nonsterile tourniquet was placed around her upper left thigh and her left thigh, knee, leg and ankle were prepped and draped with DuraPrep and sterile drapes including a sterile stockinette.  A timeout was called and she was identified as the correct patient and the correct the left knee.  An Esmarch was then used to wrap out the leg and the tourniquet was inflated to 300 mm pressure.  With the knee in an extended position a direct midline incision was made over the patella and carried proximally distally.  Dissection was carried down to the knee joint and a medial parapatellar arthrotomy is made finding a large joint effusion.  With the knee in a flexed position we found complete cartilage wear of the patellofemoral joint and the lateral compartment the knee with moderate wear on the medial compartment the knee.  Osteophytes were removed from all 3 compartments as well as remnants of the ACL and medial and lateral meniscus.  We then used an extramedullary based cutting guide for making our proximal tibia cut correcting for varus and valgus and a 7 degree slope.  We made this cut to take 2 mm  off the low side and we did back it down to more millimeters.  She does have a slight flexion contracture of that knee prior to surgery.  We then used a intramedullary based cutting guide for distal femur cut setting this for a left knee at 5 degrees external rotated and a 10 mm distal femoral cut.  We  brought the knee back down to full extension and achieve full extension with a 10 mm extension block.  We then go back to the femur and put a femoral sizing guide based off the epicondylar axis.  Based off of this we chose a size 9 femur.  We put a 4-in-1 cutting block for a size 9 femur and made the anterior and posterior cuts followed by the chamfer cuts.  We then backed the tibia and chose a size E left tibial tray and setting the rotation off of the tibial tubercle and the femur.  We did like the coverage over the tibial plateau with the size E tray.  We then did our drill hole and keel punch over this.  We next trialed our size E left tibia combined with our size 9 left CR standard femur.  We trialed up to a 12 mm thickness left medial congruent polythene insert we are pleased with range of motion and stability of the knee without insert in place.  We then made our patella cut drilled 3 holes for a size 35 patella button.  Again we are pleased with range of motion and stability with all the trial implants in place.  Next all trial implants were removed and the knee was irrigated with normal saline solution using pulsatile lavage.  Marcaine  with epinephrine  was then placed around the arthrotomy.  With the knee in a flexed position we then mixed our cement and cemented our Biomet/Zimmer persona tibial tray for a left knee size E followed by cementing our size 9 left CR standard femur.  We placed our 12 mm thickness left medial congruent polythene insert and cemented our size 35 patella button.  We then held the knee fully extended and compressed while the cemented hardened.  Excess cement debris was removed from the knee.  Once the cemented hardened the tourniquet was let down and hemostasis was obtained with electrocautery.  The arthrotomy was closed with interrupted #1 Vicryl suture followed by 0 glucose of deep tissue and 2-0 Vicryl to close subcutaneous tissue.  The skin was closed with staples.  Well-padded  sterile dressing was applied.  The patient was taken the recovery room.  Tory Gaskins, PA-C did assist during the entire case from beginning to end and his assistance was crucial and medically necessary for soft tissue management and retraction, helping guide implant placement and a layered closure of the wound.

## 2024-06-16 NOTE — Anesthesia Procedure Notes (Signed)
 Spinal  Patient location during procedure: OR Start time: 06/16/2024 8:27 AM End time: 06/16/2024 8:32 AM Reason for block: surgical anesthesia Staffing Performed: resident/CRNA  Resident/CRNA: Dasie Nena PARAS, CRNA Performed by: Dasie Nena PARAS, CRNA Authorized by: Merla Almarie HERO, DO   Preanesthetic Checklist Completed: patient identified, IV checked, site marked, risks and benefits discussed, surgical consent, monitors and equipment checked, pre-op evaluation and timeout performed Spinal Block Patient position: sitting Prep: DuraPrep and site prepped and draped Patient monitoring: heart rate, continuous pulse ox, blood pressure and cardiac monitor Approach: midline Location: L3-4 Injection technique: single-shot Needle Needle type: Pencan  Needle gauge: 24 G Needle length: 10 cm Assessment Events: CSF return Additional Notes Expiration date on kit noted and within range.   Lot # [id number]   Sterile prep and drape.    Local with Lidocaine  1%.  Good CSF flow noted with no heme or c/o paresthesia.   Patient tolerated well.

## 2024-06-16 NOTE — Anesthesia Procedure Notes (Signed)
 Anesthesia Regional Block: Adductor canal block   Pre-Anesthetic Checklist: , timeout performed,  Correct Patient, Correct Site, Correct Laterality,  Correct Procedure, Correct Position, site marked,  Risks and benefits discussed,  Surgical consent,  Pre-op evaluation,  At surgeon's request and post-op pain management  Laterality: Left  Prep: Maximum Sterile Barrier Precautions used, chloraprep       Needles:  Injection technique: Single-shot  Needle Type: Echogenic Stimulator Needle     Needle Length: 9cm  Needle Gauge: 22     Additional Needles:   Procedures:,,,, ultrasound used (permanent image in chart),,    Narrative:  Start time: 06/16/2024 8:00 AM End time: 06/16/2024 8:05 AM Injection made incrementally with aspirations every 5 mL.  Performed by: Personally  Anesthesiologist: Merla Almarie HERO, DO  Additional Notes: Monitors applied. No increased pain on injection. No increased resistance to injection. Injection made in 5cc increments. Good needle visualization. Patient tolerated procedure well.

## 2024-06-17 ENCOUNTER — Other Ambulatory Visit: Payer: Self-pay

## 2024-06-17 ENCOUNTER — Other Ambulatory Visit (HOSPITAL_COMMUNITY): Payer: Self-pay

## 2024-06-17 DIAGNOSIS — M1712 Unilateral primary osteoarthritis, left knee: Secondary | ICD-10-CM | POA: Diagnosis not present

## 2024-06-17 LAB — TYPE AND SCREEN
ABO/RH(D): O POS
Antibody Screen: POSITIVE
Unit division: 0
Unit division: 0

## 2024-06-17 LAB — BPAM RBC
Blood Product Expiration Date: 202509282359
Blood Product Expiration Date: 202509282359
Unit Type and Rh: 5100
Unit Type and Rh: 5100

## 2024-06-17 LAB — BASIC METABOLIC PANEL WITH GFR
Anion gap: 9 (ref 5–15)
BUN: 12 mg/dL (ref 8–23)
CO2: 23 mmol/L (ref 22–32)
Calcium: 9.1 mg/dL (ref 8.9–10.3)
Chloride: 98 mmol/L (ref 98–111)
Creatinine, Ser: 0.61 mg/dL (ref 0.44–1.00)
GFR, Estimated: >60 mL/min (ref >60)
Glucose, Bld: 112 mg/dL — ABNORMAL HIGH (ref 70–99)
Potassium: 5 mmol/L (ref 3.5–5.1)
Sodium: 129 mmol/L — ABNORMAL LOW (ref 135–145)

## 2024-06-17 LAB — CBC
HCT: 31.8 % — ABNORMAL LOW (ref 36.0–46.0)
Hemoglobin: 10.4 g/dL — ABNORMAL LOW (ref 12.0–15.0)
MCH: 30.7 pg (ref 26.0–34.0)
MCHC: 32.7 g/dL (ref 30.0–36.0)
MCV: 93.8 fL (ref 80.0–100.0)
Platelets: 202 K/uL (ref 150–400)
RBC: 3.39 MIL/uL — ABNORMAL LOW (ref 3.87–5.11)
RDW: 12.6 % (ref 11.5–15.5)
WBC: 7.6 K/uL (ref 4.0–10.5)
nRBC: 0 % (ref 0.0–0.2)

## 2024-06-17 MED ORDER — METHOCARBAMOL 500 MG PO TABS
500.0000 mg | ORAL_TABLET | Freq: Four times a day (QID) | ORAL | 1 refills | Status: AC | PRN
  Filled 2024-06-17: qty 30, 8d supply, fill #0
  Filled 2024-07-03: qty 30, 8d supply, fill #1

## 2024-06-17 MED ORDER — OXYCODONE HCL 5 MG PO TABS
5.0000 mg | ORAL_TABLET | ORAL | 0 refills | Status: DC | PRN
  Filled 2024-06-17: qty 30, 3d supply, fill #0

## 2024-06-17 MED ORDER — APIXABAN 2.5 MG PO TABS
2.5000 mg | ORAL_TABLET | Freq: Two times a day (BID) | ORAL | 0 refills | Status: DC
  Filled 2024-06-17: qty 30, 15d supply, fill #0

## 2024-06-17 NOTE — Care Management Obs Status (Signed)
 MEDICARE OBSERVATION STATUS NOTIFICATION   Patient Details  Name: Melissa Morrow MRN: [id number] Date of Birth: 1956-01-21   Medicare Observation Status Notification Given:  Yes    Sheri ONEIDA Sharps, LCSW 06/17/2024, 12:56 PM

## 2024-06-17 NOTE — Progress Notes (Signed)
 Physical Therapy Treatment Patient Details Name: Melissa Morrow MRN: [id number] DOB: 07/11/1956 Today's Date: 06/17/2024   History of Present Illness Pt is 68 yo female admitted on 06/16/24 for L TKA.  Pt with hx including but not limited to arthritis, HTN, DVT, lymphedema, crainotomy for acoustic neuroma and CSF leak 2012    PT Comments  Pt assisted to bathroom per request.  Pt then ambulated good distance in hallway, practiced one step and performed LE exercises.  Pt reports understanding and provided with HEP handout.  Pt feels ready for d/c home today.  Pt had no further questions.    If plan is discharge home, recommend the following: A little help with walking and/or transfers;A little help with bathing/dressing/bathroom;Assistance with cooking/housework;Help with stairs or ramp for entrance   Can travel by private vehicle        Equipment Recommendations  None recommended by PT    Recommendations for Other Services       Precautions / Restrictions Precautions Precautions: Fall;Knee Restrictions LLE Weight Bearing Per Provider Order: Weight bearing as tolerated     Mobility  Bed Mobility Overal bed mobility: Needs Assistance Bed Mobility: Supine to Sit     Supine to sit: Contact guard, HOB elevated     General bed mobility comments: pt self assisted L LE over EOB    Transfers Overall transfer level: Needs assistance Equipment used: Rolling walker (2 wheels) Transfers: Sit to/from Stand Sit to Stand: Contact guard assist           General transfer comment: verbal cues for UE and LE positioning for pain control    Ambulation/Gait Ambulation/Gait assistance: Contact guard assist Gait Distance (Feet): 180 Feet Assistive device: Rolling walker (2 wheels) Gait Pattern/deviations: Step-to pattern, Decreased stance time - left, Antalgic Gait velocity: decreased     General Gait Details: verbal cues for sequence, RW position, step length,  posture   Stairs Stairs: Yes Stairs assistance: Contact guard assist Stair Management: Step to pattern, Forwards, With walker Number of Stairs: 1 General stair comments: verbal cues for sequence, RW positioning, safety; performed twice and reports understanding   Wheelchair Mobility     Tilt Bed    Modified Rankin (Stroke Patients Only)       Balance                                            Communication Communication Communication: No apparent difficulties  Cognition Arousal: Alert Behavior During Therapy: WFL for tasks assessed/performed   PT - Cognitive impairments: No apparent impairments                       PT - Cognition Comments: pt reports feeling the effects of pain meds however following commands well        Cueing Cueing Techniques: Verbal cues  Exercises Total Joint Exercises Ankle Circles/Pumps: AROM, Both, 10 reps Quad Sets: AROM, Both, 10 reps Heel Slides: AAROM, Left, 10 reps Hip ABduction/ADduction: AAROM, Left, 10 reps Straight Leg Raises: AAROM, Left, 10 reps    General Comments        Pertinent Vitals/Pain Pain Assessment Pain Assessment: 0-10 Pain Score: 4  Pain Location: L knee Pain Descriptors / Indicators: Aching, Sore, Guarding Pain Intervention(s): Repositioned, Monitored during session, Premedicated before session    Home Living  Prior Function            PT Goals (current goals can now be found in the care plan section) Progress towards PT goals: Progressing toward goals    Frequency    7X/week      PT Plan      Co-evaluation              AM-PAC PT 6 Clicks Mobility   Outcome Measure  Help needed turning from your back to your side while in a flat bed without using bedrails?: A Little Help needed moving from lying on your back to sitting on the side of a flat bed without using bedrails?: A Little Help needed moving to and from a bed  to a chair (including a wheelchair)?: A Little Help needed standing up from a chair using your arms (e.g., wheelchair or bedside chair)?: A Little Help needed to walk in hospital room?: A Little Help needed climbing 3-5 steps with a railing? : A Little 6 Click Score: 18    End of Session Equipment Utilized During Treatment: Gait belt Activity Tolerance: Patient tolerated treatment well Patient left: in chair;with call bell/phone within reach;with chair alarm set Nurse Communication: Mobility status PT Visit Diagnosis: Difficulty in walking, not elsewhere classified (R26.2)     Time: 8968-8893 PT Time Calculation (min) (ACUTE ONLY): 35 min  Charges:    $Gait Training: 8-22 mins $Therapeutic Exercise: 8-22 mins PT General Charges $$ ACUTE PT VISIT: 1 Visit                     Tari KLEIN, DPT Physical Therapist Acute Rehabilitation Services Office: [phone]    Tari CROME Payson 06/17/2024, 2:18 PM

## 2024-06-17 NOTE — Progress Notes (Signed)
   06/17/24 0127  BiPAP/CPAP/SIPAP  BiPAP/CPAP/SIPAP Pt Type Adult  BiPAP/CPAP/SIPAP Resmed  Mask Type Nasal pillows  Dentures removed? Not applicable  FiO2 (%) 21 %  Patient Home Machine Yes  Safety Check Completed by RT for Home Unit Yes, no issues noted  Patient Home Mask Yes  Patient Home Tubing Yes  Auto Titrate Yes  Minimum cmH2O 4 cmH2O  Maximum cmH2O 16 cmH2O  Device Plugged into RED Power Outlet Yes  BiPAP/CPAP /SiPAP Vitals  Pulse Rate 67  Resp 16  SpO2 97 %  MEWS Score/Color  MEWS Score 0  MEWS Score Color Landy

## 2024-06-17 NOTE — Plan of Care (Signed)
  Problem: Clinical Measurements: Goal: Postoperative complications will be avoided or minimized Outcome: Progressing   Problem: Activity: Goal: Range of joint motion will improve Outcome: Progressing   Problem: Safety: Goal: Ability to remain free from injury will improve Outcome: Progressing   Problem: Coping: Goal: Level of anxiety will decrease Outcome: Progressing

## 2024-06-17 NOTE — Progress Notes (Signed)
 Subjective: 1 Day Post-Op Procedure(s) (LRB): ARTHROPLASTY, KNEE, TOTAL (Left) Patient reports pain as moderate.  Had a rough evening with pain control.  Objective: Vital signs in last 24 hours: Temp:  [96.6 F (35.9 C)-98.3 F (36.8 C)] 98.1 F (36.7 C) (09/27 0742) Pulse Rate:  [55-71] 55 (09/27 0742) Resp:  [10-18] 15 (09/27 0742) BP: (100-163)/(55-77) 126/57 (09/27 0742) SpO2:  [93 %-100 %] 93 % (09/27 0742) FiO2 (%):  [21 %] 21 % (09/27 0127) Weight:  [83.5 kg] 83.5 kg (09/26 1203)  Intake/Output from previous day: 09/26 0701 - 09/27 0700 In: 4266.9 [P.O.:1260; I.V.:2706.9; IV Piggyback:300] Out: 2470 [Urine:2420; Blood:50] Intake/Output this shift: Total I/O In: 120 [P.O.:120] Out: -   Recent Labs    06/17/24 0332  HGB 10.4*   Recent Labs    06/17/24 0332  WBC 7.6  RBC 3.39*  HCT 31.8*  PLT 202   Recent Labs    06/17/24 0332  NA 129*  K 5.0  CL 98  CO2 23  BUN 12  CREATININE 0.61  GLUCOSE 112*  CALCIUM  9.1   No results for input(s): LABPT, INR in the last 72 hours.  Sensation intact distally Intact pulses distally Dorsiflexion/Plantar flexion intact Incision: dressing C/D/I Compartment soft   Assessment/Plan: 1 Day Post-Op Procedure(s) (LRB): ARTHROPLASTY, KNEE, TOTAL (Left) Up with therapy Discharge home with home health this afternoon.      [name] 06/17/2024, 9:40 AM

## 2024-06-17 NOTE — Discharge Instructions (Signed)

## 2024-06-17 NOTE — Progress Notes (Signed)
 Discharge meds in a secure bag delivered to pt in room by this RN

## 2024-06-18 NOTE — Discharge Summary (Signed)
 Patient ID: Melissa Morrow MRN: [id number] DOB/AGE: 1956-01-17 68 y.o.  Admit date: 06/16/2024 Discharge date: 06/17/24  Admission Diagnoses:  Principal Problem:   Unilateral primary osteoarthritis, left knee Active Problems:   Status post total left knee replacement   Discharge Diagnoses:  Same  Past Medical History:  Diagnosis Date   Arthritis    Depression    DVT (deep venous thrombosis) (HCC)    Confirm location with patient.   Hypertension    Mild   Lymphedema    LLE   Obesity     Surgeries: Procedure(s): ARTHROPLASTY, KNEE, TOTAL on 06/16/2024   Consultants:   Discharged Condition: Improved  Hospital Course: Melissa Morrow is an 68 y.o. female who was admitted 06/16/2024 for operative treatment ofUnilateral primary osteoarthritis, left knee. Patient has severe unremitting pain that affects sleep, daily activities, and work/hobbies. After pre-op clearance the patient was taken to the operating room on 06/16/2024 and underwent  Procedure(s): ARTHROPLASTY, KNEE, TOTAL.    Patient was given perioperative antibiotics:  Anti-infectives (From admission, onward)    Start     Dose/Rate Route Frequency Ordered Stop   06/16/24 1400  ceFAZolin  (ANCEF ) IVPB 2g/100 mL premix        2 g 200 mL/hr over 30 Minutes Intravenous Every 6 hours 06/16/24 1126 06/16/24 2136   06/16/24 0700  ceFAZolin  (ANCEF ) IVPB 2g/100 mL premix        2 g 200 mL/hr over 30 Minutes Intravenous On call to O.R. 06/16/24 9347 06/16/24 0848        Patient was given sequential compression devices, early ambulation, and chemoprophylaxis to prevent DVT.  Inpatient Morphine Milligram Equivalents Per Day 9/26 - 9/27   Values displayed are in units of MME/Day    Order Start / End Date 9/26 Yesterday    oxyCODONE  (Oxy IR/ROXICODONE ) immediate release tablet 5 mg 9/26 - 9/26 0 of Unknown --    oxyCODONE  (ROXICODONE ) 5 MG/5ML solution 5 mg 9/26 - 9/26 0 of Unknown --      Group total: 0 of Unknown      HYDROmorphone  (DILAUDID ) injection 0.25-0.5 mg 9/26 - 9/26 0 of 40-80 --    fentaNYL  (SUBLIMAZE ) injection 50-100 mcg 9/26 - 9/26 30 of 15-30 --    fentaNYL  (SUBLIMAZE ) 50 MCG/ML injection 9/26 - 9/26 0 of Unknown --    HYDROmorphone  (DILAUDID ) injection 0.5-1 mg 9/26 - 9/27 20 of 40-80 20 of 40-80    oxyCODONE  (Oxy IR/ROXICODONE ) immediate release tablet 5-10 mg 9/26 - 9/27 22.5 of 30-60 45 of 30-60    oxyCODONE  (Oxy IR/ROXICODONE ) immediate release tablet 10-15 mg 9/26 - 9/27 15 of 60-90 0 of 60-90    Daily Totals  87.5 of Unknown (at least 185-340) 65 of 130-230    Calculation Errors     Order Type Date Details   oxyCODONE  (Oxy IR/ROXICODONE ) immediate release tablet 5 mg Ordered Dose -- Insufficient frequency information   oxyCODONE  (ROXICODONE ) 5 MG/5ML solution 5 mg Ordered Dose -- Insufficient frequency information   fentaNYL  (SUBLIMAZE ) 50 MCG/ML injection Ordered Dose -- Frequency type could not be determined            Patient benefited maximally from hospital stay and there were no complications.    Recent vital signs: No data found.   Recent laboratory studies:  Recent Labs    06/17/24 0332  WBC 7.6  HGB 10.4*  HCT 31.8*  PLT 202  NA 129*  K 5.0  CL 98  CO2  23  BUN 12  CREATININE 0.61  GLUCOSE 112*  CALCIUM  9.1     Discharge Medications:   Allergies as of 06/17/2024       Reactions   Gadolinium Derivatives Nausea And Vomiting   Pt began immed vomiting post imj of multihance    Ace Inhibitors Other (See Comments)   Angioedema        Medication List     TAKE these medications    Advil PM 200-25 MG Caps Generic drug: Ibuprofen-diphenhydrAMINE  HCl Take 1 capsule by mouth at bedtime as needed (pain/sleep).   Arexvy  120 MCG/0.5ML injection Generic drug: RSV vaccine recomb adjuvanted Inject into the muscle.   buPROPion  ER 100 MG 12 hr tablet Commonly known as: WELLBUTRIN  SR Take 1 tablet (100 mg total) by mouth in the morning and at  noon.   diphenhydramine -acetaminophen  25-500 MG Tabs tablet Commonly known as: TYLENOL  PM Take 1 tablet by mouth at bedtime as needed (pain/sleep).   Eliquis  2.5 MG Tabs tablet Generic drug: apixaban  Take 1 tablet (2.5 mg total) by mouth every 12 (twelve) hours.   estradiol  0.1 MG/GM vaginal cream Commonly known as: ESTRACE  Place a blueberry sized drop of cream into the vagina twice weekly   folic acid  1 MG tablet Commonly known as: FOLVITE  Take 1 tablet (1 mg total) by mouth daily.   hydrochlorothiazide  12.5 MG capsule Commonly known as: MICROZIDE  Take 1 capsule (12.5 mg total) by mouth in the morning.   methocarbamol  500 MG tablet Commonly known as: ROBAXIN  Take 1 tablet (500 mg total) by mouth every 6 (six) hours as needed for muscle spasms.   multivitamin tablet Take 1 tablet by mouth daily.   oxyCODONE  5 MG immediate release tablet Commonly known as: Oxy IR/ROXICODONE  Take 1-2 tablets (5-10 mg total) by mouth every 4 (four) hours as needed for moderate pain (pain score 4-6) (pain score 4-6).   Pfizer COVID-19 Vac Bivalent injection Generic drug: COVID-19 mRNA bivalent vaccine Proofreader) Inject into the muscle.   Pfizer-BioNT COVID-19 Vac-TriS Susp injection Generic drug: COVID-19 mRNA Vac-TriS (Pfizer) Inject into the muscle.   rosuvastatin  10 MG tablet Commonly known as: CRESTOR  Take 1 tablet (10 mg total) by mouth daily.   Shingrix  injection Generic drug: Zoster Vaccine Adjuvanted Inject into the muscle.   tazarotene  0.1 % gel Commonly known as: TAZORAC  Apply to involved nail at bedtime.   Vitamin D  (Ergocalciferol ) 1.25 MG (50000 UNIT) Caps capsule Commonly known as: DRISDOL  Take 1 capsule (50,000 Units total) by mouth once a week.        Diagnostic Studies: DG Knee Left Port Result Date: 06/16/2024 CLINICAL DATA:  [id number] Status post total left knee replacement [id number]. EXAM: PORTABLE LEFT KNEE - 1-2 VIEW COMPARISON:  03/15/2023. FINDINGS: No acute  fracture or dislocation. No aggressive osseous lesion. Baseline examination status post left total knee arthroplasty with patellar resurfacing. The hardware is intact. No periprosthetic fracture or lucency. There is near anatomic alignment. Anterior skin staples noted. There is small amount of air surrounding the knee joint, related to surgery. No radiopaque foreign bodies. IMPRESSION: Baseline examination status post left total knee arthroplasty with patellar resurfacing. No evidence of acute hardware complication. Electronically Signed   By: Ree Molt M.D.   On: 06/16/2024 14:26    Disposition: Discharge disposition: 01-Home or Self Care          Follow-up Information     [facility] Follow [facility].   Specialty: Home Health Services Why: to provide home physical  therapy visits Contact information: [address] [place] [address] [phone]         Vernetta Lonni GRADE, MD Follow up in 2 week(s).   Specialty: Orthopedic Surgery Contact information: [address] [place]  [place] [address] [phone]                  Signed: Lonni GRADE Vernetta 06/18/2024, 4:40 PM

## 2024-06-19 ENCOUNTER — Encounter (HOSPITAL_COMMUNITY): Payer: Self-pay | Admitting: Orthopaedic Surgery

## 2024-06-19 ENCOUNTER — Encounter: Payer: Self-pay | Admitting: Orthopaedic Surgery

## 2024-06-22 ENCOUNTER — Telehealth: Payer: Self-pay | Admitting: *Deleted

## 2024-06-22 ENCOUNTER — Other Ambulatory Visit: Payer: Self-pay | Admitting: Orthopaedic Surgery

## 2024-06-22 ENCOUNTER — Other Ambulatory Visit: Payer: Self-pay

## 2024-06-22 ENCOUNTER — Other Ambulatory Visit (HOSPITAL_COMMUNITY): Payer: Self-pay

## 2024-06-22 MED ORDER — OXYCODONE HCL 5 MG PO TABS
5.0000 mg | ORAL_TABLET | Freq: Four times a day (QID) | ORAL | 0 refills | Status: DC | PRN
  Filled 2024-06-22: qty 30, 5d supply, fill #0

## 2024-06-22 NOTE — Telephone Encounter (Signed)
Sent in by MD.

## 2024-06-22 NOTE — Telephone Encounter (Signed)
Patient called requesting refill of pain medication. Pharmacy on chart. Thank you.

## 2024-06-27 ENCOUNTER — Other Ambulatory Visit: Payer: Self-pay | Admitting: Orthopaedic Surgery

## 2024-06-27 ENCOUNTER — Other Ambulatory Visit (HOSPITAL_COMMUNITY): Payer: Self-pay

## 2024-06-27 ENCOUNTER — Telehealth: Payer: Self-pay | Admitting: *Deleted

## 2024-06-27 MED ORDER — GABAPENTIN 300 MG PO CAPS
300.0000 mg | ORAL_CAPSULE | Freq: Every evening | ORAL | 0 refills | Status: AC | PRN
  Filled 2024-06-27: qty 30, 30d supply, fill #0

## 2024-06-27 NOTE — Telephone Encounter (Signed)
 Patient called asking about pain at night and we discussed how pain seems to be more prevalent at night and people really do complain most about sleep deprivation in this period. She is stating she is having aching at times, sharp shooting, nerve sounding type pain. She doesn't love the Oxycodone  and is really doing well, but was wondering if anything else could take that edge off the sharp shooting pain she is having? Thank you.

## 2024-06-29 ENCOUNTER — Ambulatory Visit: Admitting: Orthopaedic Surgery

## 2024-06-29 ENCOUNTER — Other Ambulatory Visit: Payer: Self-pay

## 2024-06-29 ENCOUNTER — Encounter: Payer: Self-pay | Admitting: Orthopaedic Surgery

## 2024-06-29 ENCOUNTER — Ambulatory Visit (INDEPENDENT_AMBULATORY_CARE_PROVIDER_SITE_OTHER)

## 2024-06-29 ENCOUNTER — Other Ambulatory Visit (HOSPITAL_COMMUNITY): Payer: Self-pay

## 2024-06-29 DIAGNOSIS — M25662 Stiffness of left knee, not elsewhere classified: Secondary | ICD-10-CM

## 2024-06-29 DIAGNOSIS — R2689 Other abnormalities of gait and mobility: Secondary | ICD-10-CM

## 2024-06-29 DIAGNOSIS — Z96652 Presence of left artificial knee joint: Secondary | ICD-10-CM

## 2024-06-29 DIAGNOSIS — R600 Localized edema: Secondary | ICD-10-CM

## 2024-06-29 DIAGNOSIS — M25562 Pain in left knee: Secondary | ICD-10-CM

## 2024-06-29 DIAGNOSIS — M6281 Muscle weakness (generalized): Secondary | ICD-10-CM

## 2024-06-29 MED ORDER — HYDROCODONE-ACETAMINOPHEN 5-325 MG PO TABS
1.0000 | ORAL_TABLET | Freq: Four times a day (QID) | ORAL | 0 refills | Status: AC | PRN
  Filled 2024-06-29: qty 30, 4d supply, fill #0

## 2024-06-29 NOTE — Therapy (Signed)
 OUTPATIENT PHYSICAL THERAPY LOWER EXTREMITY EVALUATION   Patient Name: Melissa Morrow MRN: [id number] DOB:07-Apr-1956, 68 y.o., female Today's Date: 06/29/2024  END OF SESSION:  PT End of Session - 06/29/24 1508     Visit Number 1    Number of Visits 21    PT Start Time 1430    PT Stop Time 1510    PT Time Calculation (min) 40 min    Activity Tolerance Patient tolerated treatment well    Behavior During Therapy WFL for tasks assessed/performed          Past Medical History:  Diagnosis Date   Arthritis    Depression    DVT (deep venous thrombosis) (HCC)    Confirm location with patient.   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   BUNIONECTOMY  1987 and 1996   CESAREAN SECTION  1989 and 1991   DILATION AND CURETTAGE OF UTERUS  1988   GALLBLADDER SURGERY  09/21/2002   laparoscopic   HERNIA REPAIR  04/21/2024   umbilical hernia   Placement of Greenfield Filter  09/21/1986   TOTAL KNEE ARTHROPLASTY Left 06/16/2024   Procedure: ARTHROPLASTY, KNEE, TOTAL;  Surgeon: Vernetta Lonni GRADE, MD;  Location: [facility];  Service: Orthopedics;  Laterality: Left;   Patient Active Problem List   Diagnosis Date Noted   Status post total left knee replacement 06/16/2024   High blood pressure 03/11/2011   Hearing loss 03/11/2011   Contact lens/glasses fitting 03/11/2011   Menopause 03/11/2011   Family history of breast cancer 03/11/2011    PCP: [name], MD   REFERRING PROVIDER: Vernetta Lonni GRADE*   REFERRING DIAG:  Diagnosis  M17.12 (ICD-10-CM) - Unilateral primary osteoarthritis, left knee  *Left total knee arthroplasty to be done on 06/16/24. Will have HHPT x 2 weeks prior to OPPT. Eval and treat OPPT. Will be ready by post op visit with Dr. Vernetta on 06/29/24.   THERAPY DIAG:   Acute pain of left knee  Stiffness of left knee, not elsewhere classified  Other abnormalities of gait and mobility  Localized edema  Muscle weakness (generalized)  Rationale for Evaluation and Treatment: Rehabilitation  ONSET DATE: surgery 06/16/24 L TKA  SUBJECTIVE:   SUBJECTIVE STATEMENT: Pt reports having OA in the knee leading to TKA on the left.  No AD needed presurgery.  Had HHPT for 5 visits.  Has a CPM machine at home.  Reports pain is moderate.   PERTINENT HISTORY: + hx/o large blood clot in L LE leading to + hx/o swelling/lyphedema PAIN:  Are you having pain? Yes: NPRS scale: 4/10 Pain location: anterior mostly Pain description: sharp Aggravating factors: cpm, bending, walking Relieving factors: rest, medication  PRECAUTIONS: None  RED FLAGS: None   WEIGHT BEARING RESTRICTIONS: No  FALLS:  Has patient fallen in last 6 months? No  LIVING ENVIRONMENT: Lives with: lives with their spouse Lives in: House/apartment Stairs: Yes: External: 4 steps; can reach both Has following equipment at home: Single point cane  OCCUPATION: retired  PLOF: Independent  PATIENT GOALS: decrease pain and increase walking  NEXT MD VISIT: 08/02/24  OBJECTIVE:  Note: Objective measures were completed at Evaluation unless otherwise noted.  DIAGNOSTIC FINDINGS: 03/14/24  An AP and lateral of the left knee  shows valgus malalignment with  tricompartment arthritis.  There are osteophytes in all 3 compartments  especially the medial compartment and patellofemoral joint.   PATIENT SURVEYS:  PSFS: THE PATIENT SPECIFIC FUNCTIONAL SCALE  Place score of 0-10 (0 = unable to perform activity and 10 = able to perform activity at the same level as before injury or problem)  Activity Date: 06/29/24    Walking quickly 4    2.bending knee/squatting 5    3.transfers 7    4.      Total Score 5.33      Total Score = Sum of activity scores/number of activities  Minimally Detectable  Change: 3 points (for single activity); 2 points (for average score)  [name] (nd). The Patient Specific Functional Scale . Retrieved from [email or web address]   COGNITION: Overall cognitive status: Within functional limits for tasks assessed     SENSATION: WFL  EDEMA:  Circumferential: R leg 38 cm, L leg 49 cm  MUSCLE LENGTH: Hamstrings: Left moderate restriction  POSTURE: No Significant postural limitations  PALPATION: 1+ anterior L knee and jt line   LOWER EXTREMITY ROM:  A/P ROM Right eval Left eval  Hip flexion    Hip extension    Hip abduction    Hip adduction    Hip internal rotation    Hip external rotation    Knee flexion  80/ 85  Knee extension  +11/ +8  Ankle dorsiflexion    Ankle plantarflexion    Ankle inversion    Ankle eversion     (Blank rows = not tested)  LOWER EXTREMITY MMT:  MMT Right eval Left eval  Hip flexion  4+  Hip extension    Hip abduction  4  Hip adduction    Hip internal rotation    Hip external rotation    Knee flexion  4-  Knee extension  3+  Ankle dorsiflexion    Ankle plantarflexion    Ankle inversion    Ankle eversion     (Blank rows = not tested)  LOWER EXTREMITY SPECIAL TESTS:  S/P- no special tests  FUNCTIONAL TESTS:  5 times sit to stand: 16 sec  GAIT: Distance walked: household Assistive device utilized: Single point cane Level of assistance: Complete Independence Comments: ordering SPC for home                                                                                                                                TREATMENT DATE:  06/29/24 Initial evaluation of L knee completed  followed by review, instruction and trial set of HEP.  Self care for edema control and use of CPM machine discussed.  Vaso:  Lt LE elevated on wedge with extra pillows for increased height.  Med compression and 36 degrees for 10 minutes.  PATIENT  EDUCATION:  Education details: HEP Person educated: Patient Education method: Explanation, Demonstration, Actor cues, and Verbal cues Education comprehension: verbalized understanding,  returned demonstration, and verbal cues required  HOME EXERCISE PROGRAM: Access Code: [email or web address] URL: [email or web address] Date: 06/29/2024 Prepared by: Burnard Meth  Exercises - Supine Quad Set  - 2 x daily - 7 x weekly - 2 sets - 10 reps - 3 hold - Supine Active Straight Leg Raise  - 2 x daily - 7 x weekly - 2 sets - 10 reps - Supine Heel Slide  - 2 x daily - 7 x weekly - 2 sets - 10 reps - 3 hold - Mini Squat with Counter Support  - 1 x daily - 7 x weekly - 2 sets - 10 reps - 2 hold  ASSESSMENT:  CLINICAL IMPRESSION: Patient is a 68 y.o. female who was seen today for physical therapy evaluation and treatment for L TKA.  She presents with decreased ROM, decreased strength, pain, edema and altered gait. She would benefit from skilled PT to address these issues and return to previous LOA.  OBJECTIVE IMPAIRMENTS: decreased ROM, decreased strength, increased edema, impaired flexibility, and pain.   ACTIVITY LIMITATIONS: bending, sitting, standing, squatting, sleeping, stairs, and transfers  PARTICIPATION LIMITATIONS: meal prep, cleaning, driving, shopping, and community activity  PERSONAL FACTORS: 1 comorbidity: Lymphedema are also affecting patient's functional outcome.   REHAB POTENTIAL: Good  CLINICAL DECISION MAKING: Stable/uncomplicated  EVALUATION COMPLEXITY: Moderate   GOALS: Goals reviewed with patient? Yes  SHORT TERM GOALS: Target date: 07/20/2024  3 weeks   Patient to be independent with HEP. Baseline: Goal status: INITIAL  2.  Decreased pain by 1 level. Baseline:  Goal status: INITIAL  3.  Reduce edema by 2 cm. Baseline:  Goal status: INITIAL   LONG TERM GOALS: Target date: 09/07/2024  10 weeks    Patient to be independent with self progressive HEP by  time of discharge.              Baseline:  Goal status: INITIAL  2.  Increase active range of motion to 0 degrees extension.           Baseline:  Goal status: INITIAL  3.  Increase active flexion to 105 or more degrees of flexion. Baseline:  Goal status: INITIAL  4.  Increase strength by 1 full grade of left lower extremity at time of discharge. Baseline:  Goal status: INITIAL  5.  Patient able to ambulate short community distances with no assistive device and minimal to no pain at discharge. Baseline: household Goal status: INITIAL  6.  Increased score of PS FS to greater than 7.33. Baseline: 5.33 Goal status: INITIAL  7. Reduce max pain to 2/10 with all activities. Baseline: 4/10 Goal status: INITIAL  8. Reduce edema by > 5cm in LLE. Baseline: at 49 cm Goal status: INITIAL       PLAN:  PT FREQUENCY: 2x/week-3x week  PT DURATION: 10 weeks  PLANNED INTERVENTIONS: 97164- PT Re-evaluation, 97110-Therapeutic exercises, 97530- Therapeutic activity, 97112- Neuromuscular re-education, 97535- Self Care, [id number]- Manual therapy, [phone]- Gait training, [phone]- Vasopneumatic device, Patient/Family education, Stair training, Joint mobilization, and Moist heat  PLAN FOR NEXT SESSION: Start on bike.   Burnard CHRISTELLA Meth, PT 06/29/2024, 3:09 PM

## 2024-06-29 NOTE — Progress Notes (Signed)
 Melissa Morrow comes in today for first postoperative visit status post a left total knee replacement.  She actually has physical therapy as an outpatient upstairs this afternoon.  She is doing well overall.  She does wear compressive hose due to chronic lymphedema in that leg but has had a lot of swelling.  She has been on Eliquis  as a blood thinning medication just for surgery.  On exam her calf is soft.  She can stop her Eliquis .  Her incision looks good.  Staples have been removed and Steri-Strips applied.  There is moderate swelling to be expected.  I can flex her to just past 80 degrees.  We will send in some hydrocodone for pain instead of oxycodone .  She has taken Neurontin at night and a muscle relaxant which has helped.  Will see her back in 4 weeks from now for repeat exam but no x-rays are needed.  She can drive when she does not have a narcotics in her system.

## 2024-07-04 ENCOUNTER — Other Ambulatory Visit (HOSPITAL_COMMUNITY): Payer: Self-pay

## 2024-07-04 ENCOUNTER — Ambulatory Visit (INDEPENDENT_AMBULATORY_CARE_PROVIDER_SITE_OTHER): Payer: Self-pay

## 2024-07-04 DIAGNOSIS — M25562 Pain in left knee: Secondary | ICD-10-CM

## 2024-07-04 DIAGNOSIS — M6281 Muscle weakness (generalized): Secondary | ICD-10-CM

## 2024-07-04 DIAGNOSIS — R2689 Other abnormalities of gait and mobility: Secondary | ICD-10-CM

## 2024-07-04 DIAGNOSIS — M25662 Stiffness of left knee, not elsewhere classified: Secondary | ICD-10-CM

## 2024-07-04 DIAGNOSIS — R600 Localized edema: Secondary | ICD-10-CM | POA: Diagnosis not present

## 2024-07-04 NOTE — Therapy (Signed)
 OUTPATIENT PHYSICAL THERAPY LOWER EXTREMITY TREATMENT   Patient Name: Melissa Morrow MRN: [id number] DOB:1956/03/28, 68 y.o., female Today's Date: 07/04/2024  END OF SESSION:  PT End of Session - 07/04/24 1302     Visit Number 2    Number of Visits 21    Date for Recertification  09/07/24    PT Start Time 0930    PT Stop Time 1018    PT Time Calculation (min) 48 min    Activity Tolerance Patient tolerated treatment well    Behavior During Therapy WFL for tasks assessed/performed           Past Medical History:  Diagnosis Date   Arthritis    Depression    DVT (deep venous thrombosis) (HCC)    Confirm location with patient.   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   BUNIONECTOMY  1987 and 1996   CESAREAN SECTION  1989 and 1991   DILATION AND CURETTAGE OF UTERUS  1988   GALLBLADDER SURGERY  09/21/2002   laparoscopic   HERNIA REPAIR  04/21/2024   umbilical hernia   Placement of [place]field Filter  09/21/1986   TOTAL KNEE ARTHROPLASTY Left 06/16/2024   Procedure: ARTHROPLASTY, KNEE, TOTAL;  Surgeon: Vernetta Lonni GRADE, MD;  Location: [facility];  Service: Orthopedics;  Laterality: Left;   Patient Active Problem List   Diagnosis Date Noted   Status post total left knee replacement 06/16/2024   High blood pressure 03/11/2011   Hearing loss 03/11/2011   Contact lens/glasses fitting 03/11/2011   Menopause 03/11/2011   Family history of breast cancer 03/11/2011    PCP: [name], MD   REFERRING PROVIDER: Vernetta Lonni GRADE*   REFERRING DIAG:  Diagnosis  M17.12 (ICD-10-CM) - Unilateral primary osteoarthritis, left knee  *Left total knee arthroplasty to be done on 06/16/24. Will have HHPT x 2 weeks prior to OPPT. Eval and treat OPPT. Will be ready by post op visit with  Dr. Vernetta on 06/29/24.   THERAPY DIAG:  Acute pain of left knee  Localized edema  Stiffness of left knee, not elsewhere classified  Muscle weakness (generalized)  Other abnormalities of gait and mobility  Rationale for Evaluation and Treatment: Rehabilitation  ONSET DATE: surgery 06/16/24 L TKA  SUBJECTIVE:   SUBJECTIVE STATEMENT: Pt reports lots of walking over weekend and pain increased on Monday.  Today still a little  puffy.  PERTINENT HISTORY: + hx/o large blood clot in L LE leading to + hx/o swelling/lyphedema PAIN:  Are you having pain? Yes: NPRS scale: 2/10 Pain location: anterior mostly Pain description: sharp Aggravating factors: cpm, bending, walking Relieving factors: rest, medication  PRECAUTIONS: None  RED FLAGS: None   WEIGHT BEARING RESTRICTIONS: No  FALLS:  Has patient fallen in last 6 months? No  LIVING ENVIRONMENT: Lives with: lives with their spouse Lives in: House/apartment Stairs: Yes: External: 4 steps; can reach both Has following equipment at home: Single point cane  OCCUPATION: retired  PLOF: Independent  PATIENT GOALS: decrease pain and increase walking  NEXT MD VISIT: 08/02/24  OBJECTIVE:  Note: Objective measures were completed at Evaluation unless otherwise noted.  DIAGNOSTIC FINDINGS: 03/14/24  An AP and lateral of the left knee shows valgus malalignment with  tricompartment arthritis.  There  are osteophytes in all 3 compartments  especially the medial compartment and patellofemoral joint.   PATIENT SURVEYS:  PSFS: THE PATIENT SPECIFIC FUNCTIONAL SCALE  Place score of 0-10 (0 = unable to perform activity and 10 = able to perform activity at the same level as before injury or problem)  Activity Date: 06/29/24    Walking quickly 4    2.bending knee/squatting 5    3.transfers 7    4.      Total Score 5.33      Total Score = Sum of activity scores/number of activities  Minimally Detectable Change: 3 points (for  single activity); 2 points (for average score)  [name] (nd). The Patient Specific Functional Scale . Retrieved from [email or web address]   COGNITION: Overall cognitive status: Within functional limits for tasks assessed     SENSATION: WFL  EDEMA:  Circumferential: R leg 38 cm, L leg 49 cm  MUSCLE LENGTH: Hamstrings: Left moderate restriction  POSTURE: No Significant postural limitations  PALPATION: 1+ anterior L knee and jt line   LOWER EXTREMITY ROM:  A/P ROM Right eval Left eval  Hip flexion    Hip extension    Hip abduction    Hip adduction    Hip internal rotation    Hip external rotation    Knee flexion  80/ 85  Knee extension  +11/ +8  Ankle dorsiflexion    Ankle plantarflexion    Ankle inversion    Ankle eversion     (Blank rows = not tested)  LOWER EXTREMITY MMT:  MMT Right eval Left eval  Hip flexion  4+  Hip extension    Hip abduction  4  Hip adduction    Hip internal rotation    Hip external rotation    Knee flexion  4-  Knee extension  3+  Ankle dorsiflexion    Ankle plantarflexion    Ankle inversion    Ankle eversion     (Blank rows = not tested)  LOWER EXTREMITY SPECIAL TESTS:  S/P- no special tests  FUNCTIONAL TESTS:  5 times sit to stand: 16 sec  GAIT: Distance walked: household Assistive device utilized: Single point cane Level of assistance: Complete Independence Comments: ordering SPC for home                                                                                                                                TREATMENT DATE: L TKA 07/04/24  Nustep level 3 7 min LAQ 2#  3x10 L LE on 6 box 10x lean in Tandem 3x30 sec each foot Incline board stretch 3x30 sec Standing Tband TKE green 2x10 SAQ 2.5# [employer] with R LE elevation at 36 degrees ands medium compression x10 min    06/29/24 Initial evaluation of L knee completed  followed by  review, instruction and trial set of HEP.  Self care for edema control and use of CPM machine discussed.  Vaso:  Lt LE elevated on wedge with extra pillows for increased height.  Med compression and 36 degrees for 10 minutes.  PATIENT EDUCATION:  Education details: HEP Person educated: Patient Education method: Explanation, Demonstration, Actor cues, and Verbal cues Education comprehension: verbalized understanding, returned demonstration, and verbal cues required  HOME EXERCISE PROGRAM: Access Code: [email or web address] URL: [email or web address] Date: 06/29/2024 Prepared by: Burnard Meth  Exercises - Supine Quad Set  - 2 x daily - 7 x weekly - 2 sets - 10 reps - 3 hold - Supine Active Straight Leg Raise  - 2 x daily - 7 x weekly - 2 sets - 10 reps - Supine Heel Slide  - 2 x daily - 7 x weekly - 2 sets - 10 reps - 3 hold - Mini Squat with Counter Support  - 1 x daily - 7 x weekly - 2 sets - 10 reps - 2 hold  ASSESSMENT:  CLINICAL IMPRESSION: Patient needed VC for TKE exercise.  Demonstrated understanding. OBJECTIVE IMPAIRMENTS: decreased ROM, decreased strength, increased edema, impaired flexibility, and pain.   ACTIVITY LIMITATIONS: bending, sitting, standing, squatting, sleeping, stairs, and transfers  PARTICIPATION LIMITATIONS: meal prep, cleaning, driving, shopping, and community activity  PERSONAL FACTORS: 1 comorbidity: Lymphedema are also affecting patient's functional outcome.   REHAB POTENTIAL: Good  CLINICAL DECISION MAKING: Stable/uncomplicated  EVALUATION COMPLEXITY: Moderate   GOALS: Goals reviewed with patient? Yes  SHORT TERM GOALS: Target date: 07/20/2024  3 weeks   Patient to be independent with HEP. Baseline: Goal status: INITIAL  2.  Decreased pain by 1 level. Baseline:  Goal status: INITIAL  3.  Reduce edema by 2 cm. Baseline:  Goal status: INITIAL   LONG TERM GOALS: Target date: 09/07/2024  10 weeks    Patient to be independent  with self progressive HEP by time of discharge.              Baseline:  Goal status: INITIAL  2.  Increase active range of motion to 0 degrees extension.           Baseline:  Goal status: INITIAL  3.  Increase active flexion to 105 or more degrees of flexion. Baseline:  Goal status: INITIAL  4.  Increase strength by 1 full grade of left lower extremity at time of discharge. Baseline:  Goal status: INITIAL  5.  Patient able to ambulate short community distances with no assistive device and minimal to no pain at discharge. Baseline: household Goal status: INITIAL  6.  Increased score of PS FS to greater than 7.33. Baseline: 5.33 Goal status: INITIAL  7. Reduce max pain to 2/10 with all activities. Baseline: 4/10 Goal status: INITIAL  8. Reduce edema by > 5cm in LLE. Baseline: at 49 cm Goal status: INITIAL       PLAN:  PT FREQUENCY: 2x/week-3x week  PT DURATION: 10 weeks  PLANNED INTERVENTIONS: 97164- PT Re-evaluation, 97110-Therapeutic exercises, 97530- Therapeutic activity, 97112- Neuromuscular re-education, 97535- Self Care, 02859- Manual therapy, [phone]- Gait training, [phone]- Vasopneumatic device, Patient/Family education, Stair training, Joint mobilization, and Moist heat  PLAN FOR NEXT SESSION: Continue for ROM increases in knee.   Burnard CHRISTELLA Meth, PT 07/04/2024, 1:04 PM

## 2024-07-10 NOTE — Therapy (Signed)
 OUTPATIENT PHYSICAL THERAPY LOWER EXTREMITY TREATMENT   Patient Name: Melissa Morrow MRN: [id number] DOB:22-Jan-1956, 68 y.o., female Today's Date: 07/11/2024  END OF SESSION:  PT End of Session - 07/11/24 0749     Visit Number 3    Number of Visits 21    Date for Recertification  09/07/24    PT Start Time 0715    PT Stop Time 0803    PT Time Calculation (min) 48 min    Activity Tolerance Patient tolerated treatment well    Behavior During Therapy [facility] for tasks assessed/performed            Past Medical History:  Diagnosis Date   Arthritis    Depression    DVT (deep venous thrombosis) (HCC)    Confirm location with patient.   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   BUNIONECTOMY  1987 and 1996   CESAREAN SECTION  1989 and 1991   DILATION AND CURETTAGE OF UTERUS  1988   GALLBLADDER SURGERY  09/21/2002   laparoscopic   HERNIA REPAIR  04/21/2024   umbilical hernia   Placement of [place] Filter  09/21/1986   TOTAL KNEE ARTHROPLASTY Left 06/16/2024   Procedure: ARTHROPLASTY, KNEE, TOTAL;  Surgeon: Vernetta Lonni GRADE, MD;  Location: [facility];  Service: Orthopedics;  Laterality: Left;   Patient Active Problem List   Diagnosis Date Noted   Status post total left knee replacement 06/16/2024   High blood pressure 03/11/2011   Hearing loss 03/11/2011   Contact lens/glasses fitting 03/11/2011   Menopause 03/11/2011   Family history of breast cancer 03/11/2011    PCP: Melissa Morrow., MD   REFERRING PROVIDER: [name] JONETTA Morrow., MD   REFERRING DIAG:  Diagnosis  M17.12 (ICD-10-CM) - Unilateral primary osteoarthritis, left knee  *Left total knee arthroplasty to be done on 06/16/24. Will have HHPT x 2 weeks prior to OPPT. Eval and treat OPPT. Will be ready by post op visit with  Dr. Vernetta on 06/29/24.   THERAPY DIAG:  Acute pain of left knee  Localized edema  Stiffness of left knee, not elsewhere classified  Muscle weakness (generalized)  Other abnormalities of gait and mobility  Rationale for Evaluation and Treatment: Rehabilitation  ONSET DATE: surgery 06/16/24 L TKA  SUBJECTIVE:   SUBJECTIVE STATEMENT: Pt reports with walking she gets pain at anterior knee and lateral shin.  PERTINENT HISTORY: + hx/o large blood clot in L LE leading to + hx/o swelling/lyphedema PAIN:  Are you having pain? Yes: NPRS scale: 2/10 Pain location: anterior mostly Pain description: sharp Aggravating factors: cpm, bending, walking Relieving factors: rest, medication  PRECAUTIONS: None  RED FLAGS: None   WEIGHT BEARING RESTRICTIONS: No  FALLS:  Has patient fallen in last 6 months? No  LIVING ENVIRONMENT: Lives with: lives with their spouse Lives in: House/apartment Stairs: Yes: External: 4 steps; can reach both Has following equipment at home: Single point cane  OCCUPATION: retired  PLOF: Independent  PATIENT GOALS: decrease pain and increase walking  NEXT MD VISIT: 08/02/24  OBJECTIVE:  Note: Objective measures were completed at Evaluation unless otherwise noted.  DIAGNOSTIC FINDINGS: 03/14/24  An AP and lateral of the left knee shows valgus malalignment with  tricompartment arthritis.  There are osteophytes in  all 3 compartments  especially the medial compartment and patellofemoral joint.   PATIENT SURVEYS:  PSFS: THE PATIENT SPECIFIC FUNCTIONAL SCALE  Place score of 0-10 (0 = unable to perform activity and 10 = able to perform activity at the same level as before injury or problem)  Activity Date: 06/29/24    Walking quickly 4    2.bending knee/squatting 5    3.transfers 7    4.      Total Score 5.33      Total Score = Sum of activity scores/number of activities  Minimally Detectable Change: 3 points (for single activity); 2 points  (for average score)  [name] (nd). The Patient Specific Functional Scale . Retrieved from [email or web address]   COGNITION: Overall cognitive status: Within functional limits for tasks assessed     SENSATION: WFL  EDEMA:  Circumferential: R leg 38 cm, L leg 49 cm  MUSCLE LENGTH: Hamstrings: Left moderate restriction  POSTURE: No Significant postural limitations  PALPATION: 1+ anterior L knee and jt line   LOWER EXTREMITY ROM:  A/P ROM Right eval Left eval  Hip flexion    Hip extension    Hip abduction    Hip adduction    Hip internal rotation    Hip external rotation    Knee flexion  80/ 85  Knee extension  +11/ +8  Ankle dorsiflexion    Ankle plantarflexion    Ankle inversion    Ankle eversion     (Blank rows = not tested)  LOWER EXTREMITY MMT:  MMT Right eval Left eval  Hip flexion  4+  Hip extension    Hip abduction  4  Hip adduction    Hip internal rotation    Hip external rotation    Knee flexion  4-  Knee extension  3+  Ankle dorsiflexion    Ankle plantarflexion    Ankle inversion    Ankle eversion     (Blank rows = not tested)  LOWER EXTREMITY SPECIAL TESTS:  S/P- no special tests  FUNCTIONAL TESTS:  5 times sit to stand: 16 sec  GAIT: Distance walked: household Assistive device utilized: Single point cane Level of assistance: Complete Independence Comments: ordering SPC for home                                                                                                                                TREATMENT DATE: L TKA 07/11/24 Rec bike level 1 7 min Leg Press  75# 3x10  Seated knee flexion with slider 2x10 Squats 2x10 LAQ 3#  3x10 Tandem 3x30 sec each foot Incline board stretch 3x30 sec Standing Tband TKE green 2x10 Vaso with R LE elevation at 36 degrees ands medium compression x10 min   07/04/24  Nustep level 3 7 min LAQ 2#  3x10 L LE on 6 box  10x lean in Tandem 3x30 sec each foot Incline board stretch 3x30 sec Standing Tband  TKE green 2x10 SAQ 2.5# [employer] with R LE elevation at 36 degrees ands medium compression x10 min    06/29/24 Initial evaluation of L knee completed  followed by review, instruction and trial set of HEP.  Self care for edema control and use of CPM machine discussed.  Vaso:  Lt LE elevated on wedge with extra pillows for increased height.  Med compression and 36 degrees for 10 minutes.  PATIENT EDUCATION:  Education details: HEP Person educated: Patient Education method: Explanation, Demonstration, Actor cues, and Verbal cues Education comprehension: verbalized understanding, returned demonstration, and verbal cues required  HOME EXERCISE PROGRAM: Access Code: [email or web address] URL: [email or web address] Date: 06/29/2024 Prepared by: Burnard Meth  Exercises - Supine Quad Set  - 2 x daily - 7 x weekly - 2 sets - 10 reps - 3 hold - Supine Active Straight Leg Raise  - 2 x daily - 7 x weekly - 2 sets - 10 reps - Supine Heel Slide  - 2 x daily - 7 x weekly - 2 sets - 10 reps - 3 hold - Mini Squat with Counter Support  - 1 x daily - 7 x weekly - 2 sets - 10 reps - 2 hold  ASSESSMENT:  CLINICAL IMPRESSION: Patient needed bars for support with balance exercises.   OBJECTIVE IMPAIRMENTS: decreased ROM, decreased strength, increased edema, impaired flexibility, and pain.   ACTIVITY LIMITATIONS: bending, sitting, standing, squatting, sleeping, stairs, and transfers  PARTICIPATION LIMITATIONS: meal prep, cleaning, driving, shopping, and community activity  PERSONAL FACTORS: 1 comorbidity: Lymphedema are also affecting patient's functional outcome.   REHAB POTENTIAL: Good  CLINICAL DECISION MAKING: Stable/uncomplicated  EVALUATION COMPLEXITY: Moderate   GOALS: Goals reviewed with patient? Yes  SHORT TERM GOALS: Target date: 07/20/2024  3 weeks   Patient to be independent with  HEP. Baseline: Goal status: INITIAL  2.  Decreased pain by 1 level. Baseline:  Goal status: INITIAL  3.  Reduce edema by 2 cm. Baseline:  Goal status: INITIAL   LONG TERM GOALS: Target date: 09/07/2024  10 weeks    Patient to be independent with self progressive HEP by time of discharge.              Baseline:  Goal status: INITIAL  2.  Increase active range of motion to 0 degrees extension.           Baseline:  Goal status: INITIAL  3.  Increase active flexion to 105 or more degrees of flexion. Baseline:  Goal status: INITIAL  4.  Increase strength by 1 full grade of left lower extremity at time of discharge. Baseline:  Goal status: INITIAL  5.  Patient able to ambulate short community distances with no assistive device and minimal to no pain at discharge. Baseline: household Goal status: INITIAL  6.  Increased score of PS FS to greater than 7.33. Baseline: 5.33 Goal status: INITIAL  7. Reduce max pain to 2/10 with all activities. Baseline: 4/10 Goal status: INITIAL  8. Reduce edema by > 5cm in LLE. Baseline: at 49 cm Goal status: INITIAL       PLAN:  PT FREQUENCY: 2x/week-3x week  PT DURATION: 10 weeks  PLANNED INTERVENTIONS: 97164- PT Re-evaluation, 97110-Therapeutic exercises, 97530- Therapeutic activity, 97112- Neuromuscular re-education, 97535- Self Care, 02859- Manual therapy, [phone]- Gait training, [phone]- Vasopneumatic device, Patient/Family education, Stair training, Joint mobilization, and Moist heat  PLAN FOR NEXT SESSION: Continue for ROM increases in knee.   Burnard CHRISTELLA Meth, PT 07/11/2024, 7:56 AM

## 2024-07-11 ENCOUNTER — Ambulatory Visit (INDEPENDENT_AMBULATORY_CARE_PROVIDER_SITE_OTHER)

## 2024-07-11 DIAGNOSIS — M25662 Stiffness of left knee, not elsewhere classified: Secondary | ICD-10-CM | POA: Diagnosis not present

## 2024-07-11 DIAGNOSIS — M6281 Muscle weakness (generalized): Secondary | ICD-10-CM | POA: Diagnosis not present

## 2024-07-11 DIAGNOSIS — R600 Localized edema: Secondary | ICD-10-CM

## 2024-07-11 DIAGNOSIS — M25562 Pain in left knee: Secondary | ICD-10-CM | POA: Diagnosis not present

## 2024-07-11 DIAGNOSIS — R2689 Other abnormalities of gait and mobility: Secondary | ICD-10-CM

## 2024-07-12 NOTE — Therapy (Signed)
 OUTPATIENT PHYSICAL THERAPY LOWER EXTREMITY TREATMENT   Patient Name: Melissa Morrow MRN: [id number] DOB:[birth year], 68 y.o., female Today's Date: 07/13/2024  END OF SESSION:  PT End of Session - 07/13/24 1427     Visit Number 4    Number of Visits 21    Date for Recertification  09/07/24    PT Start Time 1345    PT Stop Time 1433    PT Time Calculation (min) 48 min    Activity Tolerance Patient tolerated treatment well    Behavior During Therapy [facility] for tasks assessed/performed             Past Medical History:  Diagnosis Date   Arthritis    Depression    DVT (deep venous thrombosis) (HCC)    Confirm location with patient.   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   BUNIONECTOMY  1987 and 1996   CESAREAN SECTION  1989 and 1991   DILATION AND CURETTAGE OF UTERUS  1988   GALLBLADDER SURGERY  09/21/2002   laparoscopic   HERNIA REPAIR  04/21/2024   umbilical hernia   Placement of [place] Filter  09/21/1986   TOTAL KNEE ARTHROPLASTY Left 06/16/2024   Procedure: ARTHROPLASTY, KNEE, TOTAL;  Surgeon: Vernetta Lonni GRADE, MD;  Location: [facility];  Service: Orthopedics;  Laterality: Left;   Patient Active Problem List   Diagnosis Date Noted   Status post total left knee replacement 06/16/2024   High blood pressure 03/11/2011   Hearing loss 03/11/2011   Contact lens/glasses fitting 03/11/2011   Menopause 03/11/2011   Family history of breast cancer 03/11/2011    PCP: [name], MD   REFERRING PROVIDER: Vernetta Lonni GRADE*   REFERRING DIAG:  Diagnosis  M17.12 (ICD-10-CM) - Unilateral primary osteoarthritis, left knee  *Left total knee arthroplasty to be done on 06/16/24. Will have HHPT x 2 weeks prior to OPPT. Eval and treat OPPT. Will be ready by post op visit  with Dr. Vernetta on 06/29/24.   THERAPY DIAG:  Acute pain of left knee  Localized edema  Stiffness of left knee, not elsewhere classified  Muscle weakness (generalized)  Other abnormalities of gait and mobility  Rationale for Evaluation and Treatment: Rehabilitation  ONSET DATE: surgery 06/16/24 L TKA  SUBJECTIVE:   SUBJECTIVE STATEMENT: Pt reports they took away my cpm.  Walking is limited to @10  min walks.  PERTINENT HISTORY: + hx/o large blood clot in L LE leading to + hx/o swelling/lyphedema PAIN:  Are you having pain? Yes: NPRS scale: 0/10 Pain location: anterior mostly Pain description: sharp Aggravating factors: cpm, bending, walking Relieving factors: rest, medication  PRECAUTIONS: None  RED FLAGS: None   WEIGHT BEARING RESTRICTIONS: No  FALLS:  Has patient fallen in last 6 months? No  LIVING ENVIRONMENT: Lives with: lives with their spouse Lives in: House/apartment Stairs: Yes: External: 4 steps; can reach both Has following equipment at home: Single point cane  OCCUPATION: retired  PLOF: Independent  PATIENT GOALS: decrease pain and increase walking  NEXT MD VISIT: 08/02/24  OBJECTIVE:  Note: Objective measures were completed at Evaluation unless otherwise noted.  DIAGNOSTIC FINDINGS: 03/14/24  An AP and lateral of the left knee shows valgus malalignment with  tricompartment arthritis.  There are osteophytes  in all 3 compartments  especially the medial compartment and patellofemoral joint.   PATIENT SURVEYS:  PSFS: THE PATIENT SPECIFIC FUNCTIONAL SCALE  Place score of 0-10 (0 = unable to perform activity and 10 = able to perform activity at the same level as before injury or problem)  Activity Date: 06/29/24    Walking quickly 4    2.bending knee/squatting 5    3.transfers 7    4.      Total Score 5.33      Total Score = Sum of activity scores/number of activities  Minimally Detectable Change: 3 points (for single activity); 2  points (for average score)  [name] (nd). The Patient Specific Functional Scale . Retrieved from [email or web address]   COGNITION: Overall cognitive status: Within functional limits for tasks assessed     SENSATION: WFL  EDEMA:  Circumferential: R leg 38 cm, L leg 49 cm  MUSCLE LENGTH: Hamstrings: Left moderate restriction  POSTURE: No Significant postural limitations  PALPATION: 1+ anterior L knee and jt line   LOWER EXTREMITY ROM:  A/P ROM Right eval Left eval  Hip flexion    Hip extension    Hip abduction    Hip adduction    Hip internal rotation    Hip external rotation    Knee flexion  80/ 85  Knee extension  +11/ +8  Ankle dorsiflexion    Ankle plantarflexion    Ankle inversion    Ankle eversion     (Blank rows = not tested)  LOWER EXTREMITY MMT:  MMT Right eval Left eval  Hip flexion  4+  Hip extension    Hip abduction  4  Hip adduction    Hip internal rotation    Hip external rotation    Knee flexion  4-  Knee extension  3+  Ankle dorsiflexion    Ankle plantarflexion    Ankle inversion    Ankle eversion     (Blank rows = not tested)  LOWER EXTREMITY SPECIAL TESTS:  S/P- no special tests  FUNCTIONAL TESTS:  5 times sit to stand: 16 sec  GAIT: Distance walked: household Assistive device utilized: Single point cane Level of assistance: Complete Independence Comments: ordering SPC for home                                                                                                                                TREATMENT DATE: L TKA 07/13/24 Rec bike level 1 8 min Leg Press  81# 3x10  Seated knee flexion with slider 2x10 Squats 2x10 LAQ 4#  3x10 Tandem 3x30 sec each foot Standing Tband TKE green 2x10 Lean ins 6 inch step 10x Vaso with R LE elevation at 36 degrees ands medium compression x10 min   07/11/24 Rec bike level 1 7 min Leg Press  75# 3x10  Seated  knee flexion with slider 2x10 Squats 2x10 LAQ 3#  3x10 Tandem 3x30 sec each  foot Incline board stretch 3x30 sec Standing Tband TKE green 2x10 Vaso with R LE elevation at 36 degrees ands medium compression x10 min   07/04/24  Nustep level 3 7 min LAQ 2#  3x10 L LE on 6 box 10x lean in Tandem 3x30 sec each foot Incline board stretch 3x30 sec Standing Tband TKE green 2x10 SAQ 2.5# [employer] with R LE elevation at 36 degrees ands medium compression x10 min    06/29/24 Initial evaluation of L knee completed  followed by review, instruction and trial set of HEP.  Self care for edema control and use of CPM machine discussed.  Vaso:  Lt LE elevated on wedge with extra pillows for increased height.  Med compression and 36 degrees for 10 minutes.  PATIENT EDUCATION:  Education details: HEP Person educated: Patient Education method: Explanation, Demonstration, Actor cues, and Verbal cues Education comprehension: verbalized understanding, returned demonstration, and verbal cues required  HOME EXERCISE PROGRAM: Access Code: [email or web address] URL: [email or web address] Date: 06/29/2024 Prepared by: Burnard Meth  Exercises - Supine Quad Set  - 2 x daily - 7 x weekly - 2 sets - 10 reps - 3 hold - Supine Active Straight Leg Raise  - 2 x daily - 7 x weekly - 2 sets - 10 reps - Supine Heel Slide  - 2 x daily - 7 x weekly - 2 sets - 10 reps - 3 hold - Mini Squat with Counter Support  - 1 x daily - 7 x weekly - 2 sets - 10 reps - 2 hold  ASSESSMENT:  CLINICAL IMPRESSION: Pt with improved squatting with less cuing.   OBJECTIVE IMPAIRMENTS: decreased ROM, decreased strength, increased edema, impaired flexibility, and pain.   ACTIVITY LIMITATIONS: bending, sitting, standing, squatting, sleeping, stairs, and transfers  PARTICIPATION LIMITATIONS: meal prep, cleaning, driving, shopping, and community activity  PERSONAL FACTORS: 1 comorbidity: Lymphedema are also affecting  patient's functional outcome.   REHAB POTENTIAL: Good  CLINICAL DECISION MAKING: Stable/uncomplicated  EVALUATION COMPLEXITY: Moderate   GOALS: Goals reviewed with patient? Yes  SHORT TERM GOALS: Target date: 07/20/2024  3 weeks   Patient to be independent with HEP. Baseline: Goal status: MET 07/12/24  2.  Decreased pain by 1 level. Baseline:  Goal status: MET 07/12/24  3.  Reduce edema by 2 cm. Baseline:  Goal status: INITIAL   LONG TERM GOALS: Target date: 09/07/2024  10 weeks    Patient to be independent with self progressive HEP by time of discharge.              Baseline:  Goal status: INITIAL  2.  Increase active range of motion to 0 degrees extension.           Baseline:  Goal status: INITIAL  3.  Increase active flexion to 105 or more degrees of flexion. Baseline:  Goal status: INITIAL  4.  Increase strength by 1 full grade of left lower extremity at time of discharge. Baseline:  Goal status: INITIAL  5.  Patient able to ambulate short community distances with no assistive device and minimal to no pain at discharge. Baseline: household Goal status: INITIAL  6.  Increased score of PS FS to greater than 7.33. Baseline: 5.33 Goal status: INITIAL  7. Reduce max pain to 2/10 with all activities. Baseline: 4/10 Goal status: INITIAL  8. Reduce edema by > 5cm in LLE. Baseline: at 49 cm Goal status: INITIAL       PLAN:  PT FREQUENCY: 2x/week-3x week  PT  DURATION: 10 weeks  PLANNED INTERVENTIONS: 97164- PT Re-evaluation, 97110-Therapeutic exercises, 97530- Therapeutic activity, [id number]- Neuromuscular re-education, 97535- Self Care, 02859- Manual therapy, [phone]- Gait training, [phone]- Vasopneumatic device, Patient/Family education, Stair training, Joint mobilization, and Moist heat  PLAN FOR NEXT SESSION: Continue for ROM increases in knee.   Burnard CHRISTELLA Meth, PT 07/13/2024, 2:28 PM

## 2024-07-13 ENCOUNTER — Ambulatory Visit (INDEPENDENT_AMBULATORY_CARE_PROVIDER_SITE_OTHER)

## 2024-07-13 DIAGNOSIS — M6281 Muscle weakness (generalized): Secondary | ICD-10-CM

## 2024-07-13 DIAGNOSIS — M25562 Pain in left knee: Secondary | ICD-10-CM

## 2024-07-13 DIAGNOSIS — R2689 Other abnormalities of gait and mobility: Secondary | ICD-10-CM

## 2024-07-13 DIAGNOSIS — R600 Localized edema: Secondary | ICD-10-CM

## 2024-07-13 DIAGNOSIS — M25662 Stiffness of left knee, not elsewhere classified: Secondary | ICD-10-CM

## 2024-07-17 NOTE — Therapy (Signed)
 OUTPATIENT PHYSICAL THERAPY LOWER EXTREMITY TREATMENT   Patient Name: Melissa Morrow MRN: [id number] DOB:11/20/1955, 68 y.o., female Today's Date: 07/18/2024  END OF SESSION:  PT End of Session - 07/18/24 1010     Visit Number 5    Number of Visits 21    Date for Recertification  09/07/24    PT Start Time 0720    PT Stop Time 0808    PT Time Calculation (min) 48 min    Activity Tolerance Patient tolerated treatment well    Behavior During Therapy [facility] for tasks assessed/performed              Past Medical History:  Diagnosis Date   Arthritis    Depression    DVT (deep venous thrombosis) (HCC)    Confirm location with patient.   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   BUNIONECTOMY  1987 and 1996   CESAREAN SECTION  1989 and 1991   DILATION AND CURETTAGE OF UTERUS  1988   GALLBLADDER SURGERY  09/21/2002   laparoscopic   HERNIA REPAIR  04/21/2024   umbilical hernia   Placement of [place] Filter  09/21/1986   TOTAL KNEE ARTHROPLASTY Left 06/16/2024   Procedure: ARTHROPLASTY, KNEE, TOTAL;  Surgeon: Vernetta Lonni GRADE, MD;  Location: [facility];  Service: Orthopedics;  Laterality: Left;   Patient Active Problem List   Diagnosis Date Noted   Status post total left knee replacement 06/16/2024   High blood pressure 03/11/2011   Hearing loss 03/11/2011   Contact lens/glasses fitting 03/11/2011   Menopause 03/11/2011   Family history of breast cancer 03/11/2011    PCP: [name], MD   REFERRING PROVIDER: [name], MD   REFERRING DIAG:  Diagnosis  M17.12 (ICD-10-CM) - Unilateral primary osteoarthritis, left knee  *Left total knee arthroplasty to be done on 06/16/24. Will have HHPT x 2 weeks prior to OPPT. Eval and treat OPPT. Will be ready by post op visit  with Dr. Vernetta on 06/29/24.   THERAPY DIAG:  Acute pain of left knee  Localized edema  Stiffness of left knee, not elsewhere classified  Muscle weakness (generalized)  Other abnormalities of gait and mobility  Rationale for Evaluation and Treatment: Rehabilitation  ONSET DATE: surgery 06/16/24 L TKA  SUBJECTIVE:   SUBJECTIVE STATEMENT: Pt reports some cramping in the leg when she bends.  PERTINENT HISTORY: + hx/o large blood clot in L LE leading to + hx/o swelling/lyphedema PAIN:  Are you having pain? Yes: NPRS scale: 2/10 Pain location: anterior mostly Pain description: sharp Aggravating factors: cpm, bending, walking Relieving factors: rest, medication  PRECAUTIONS: None  RED FLAGS: None   WEIGHT BEARING RESTRICTIONS: No  FALLS:  Has patient fallen in last 6 months? No  LIVING ENVIRONMENT: Lives with: lives with their spouse Lives in: House/apartment Stairs: Yes: External: 4 steps; can reach both Has following equipment at home: Single point cane  OCCUPATION: retired  PLOF: Independent  PATIENT GOALS: decrease pain and increase walking  NEXT MD VISIT: 08/02/24  OBJECTIVE:  Note: Objective measures were completed at Evaluation unless otherwise noted.  DIAGNOSTIC FINDINGS: 03/14/24  An AP and lateral of the left knee shows valgus malalignment with  tricompartment arthritis.  There are osteophytes in all  3 compartments  especially the medial compartment and patellofemoral joint.   PATIENT SURVEYS:  PSFS: THE PATIENT SPECIFIC FUNCTIONAL SCALE  Place score of 0-10 (0 = unable to perform activity and 10 = able to perform activity at the same level as before injury or problem)  Activity Date: 06/29/24    Walking quickly 4    2.bending knee/squatting 5    3.transfers 7    4.      Total Score 5.33      Total Score = Sum of activity scores/number of activities  Minimally Detectable Change: 3 points (for single activity); 2 points (for average  score)  [name] (nd). The Patient Specific Functional Scale . Retrieved from Skateo[employer].com.pt   COGNITION: Overall cognitive status: Within functional limits for tasks assessed     SENSATION: WFL  EDEMA:  Circumferential: R leg 38 cm, L leg 49 cm  MUSCLE LENGTH: Hamstrings: Left moderate restriction  POSTURE: No Significant postural limitations  PALPATION: 1+ anterior L knee and jt line   LOWER EXTREMITY ROM:  A/P ROM Right eval Left eval Left 07/18/24  Hip flexion     Hip extension     Hip abduction     Hip adduction     Hip internal rotation     Hip external rotation     Knee flexion  80/ 85 95/100  Knee extension  +11/ +8   Ankle dorsiflexion     Ankle plantarflexion     Ankle inversion     Ankle eversion      (Blank rows = not tested)  LOWER EXTREMITY MMT:  MMT Right eval Left eval  Hip flexion  4+  Hip extension    Hip abduction  4  Hip adduction    Hip internal rotation    Hip external rotation    Knee flexion  4-  Knee extension  3+  Ankle dorsiflexion    Ankle plantarflexion    Ankle inversion    Ankle eversion     (Blank rows = not tested)  LOWER EXTREMITY SPECIAL TESTS:  S/P- no special tests  FUNCTIONAL TESTS:  5 times sit to stand: 16 sec  GAIT: Distance walked: household Assistive device utilized: Single point cane Level of assistance: Complete Independence Comments: ordering SPC for home                                                                                                                                TREATMENT DATE: L TKA 07/18/24 Rec bike level 1 8 min Leg Press  87# 3x10  Seated knee flexion with slider 2x10 Squats 2x10 LAQ 4#  3x10 Tandem 3x30 sec each foot Standing Tband TKE green 2x10 Lean ins 8 inch step 10x Heel raises  Stair training   Vaso with R LE elevation at 36 degrees ands medium compression x10 min    07/13/24 Rec  bike level 1 8 min Leg Press  81# 3x10  Seated knee flexion with slider 2x10 Squats 2x10 LAQ 4#  3x10 Tandem 3x30 sec each foot Standing Tband TKE green 2x10 Lean ins 6 inch step 10x Vaso with R LE elevation at 36 degrees ands medium compression x10 min   07/11/24 Rec bike level 1 7 min Leg Press  75# 3x10  Seated knee flexion with slider 2x10 Squats 2x10 LAQ 3#  3x10 Tandem 3x30 sec each foot Incline board stretch 3x30 sec Standing Tband TKE green 2x10 Vaso with R LE elevation at 36 degrees ands medium compression x10 min   07/04/24  Nustep level 3 7 min LAQ 2#  3x10 L LE on 6 box 10x lean in Tandem 3x30 sec each foot Incline board stretch 3x30 sec Standing Tband TKE green 2x10 SAQ 2.5# [employer] with R LE elevation at 36 degrees ands medium compression x10 min    06/29/24 Initial evaluation of L knee completed  followed by review, instruction and trial set of HEP.  Self care for edema control and use of CPM machine discussed.  Vaso:  Lt LE elevated on wedge with extra pillows for increased height.  Med compression and 36 degrees for 10 minutes.  PATIENT EDUCATION:  Education details: HEP Person educated: Patient Education method: Explanation, Demonstration, Actor cues, and Verbal cues Education comprehension: verbalized understanding, returned demonstration, and verbal cues required  HOME EXERCISE PROGRAM: Access Code: [email or web address] URL: [email or web address] Date: 06/29/2024 Prepared by: Burnard Meth  Exercises - Supine Quad Set  - 2 x daily - 7 x weekly - 2 sets - 10 reps - 3 hold - Supine Active Straight Leg Raise  - 2 x daily - 7 x weekly - 2 sets - 10 reps - Supine Heel Slide  - 2 x daily - 7 x weekly - 2 sets - 10 reps - 3 hold - Mini Squat with Counter Support  - 1 x daily - 7 x weekly - 2 sets - 10 reps - 2 hold  ASSESSMENT:  CLINICAL IMPRESSION: Pt with increasing ROM as demonstrated by improved flexion to 95 degrees. OBJECTIVE  IMPAIRMENTS: decreased ROM, decreased strength, increased edema, impaired flexibility, and pain.   ACTIVITY LIMITATIONS: bending, sitting, standing, squatting, sleeping, stairs, and transfers  PARTICIPATION LIMITATIONS: meal prep, cleaning, driving, shopping, and community activity  PERSONAL FACTORS: 1 comorbidity: Lymphedema are also affecting patient's functional outcome.   REHAB POTENTIAL: Good  CLINICAL DECISION MAKING: Stable/uncomplicated  EVALUATION COMPLEXITY: Moderate   GOALS: Goals reviewed with patient? Yes  SHORT TERM GOALS: Target date: 07/20/2024  3 weeks   Patient to be independent with HEP. Baseline: Goal status: MET 07/12/24  2.  Decreased pain by 1 level. Baseline:  Goal status: MET 07/12/24  3.  Reduce edema by 2 cm. Baseline:  Goal status: INITIAL   LONG TERM GOALS: Target date: 09/07/2024  10 weeks    Patient to be independent with self progressive HEP by time of discharge.              Baseline:  Goal status: INITIAL  2.  Increase active range of motion to 0 degrees extension.           Baseline:  Goal status: INITIAL  3.  Increase active flexion to 105 or more degrees of flexion. Baseline:  Goal status: INITIAL  4.  Increase strength by 1 full grade of left lower extremity at time of discharge. Baseline:  Goal status: INITIAL  5.  Patient able to ambulate short community distances  with no assistive device and minimal to no pain at discharge. Baseline: household Goal status: INITIAL  6.  Increased score of PS FS to greater than 7.33. Baseline: 5.33 Goal status: INITIAL  7. Reduce max pain to 2/10 with all activities. Baseline: 4/10 Goal status: INITIAL  8. Reduce edema by > 5cm in LLE. Baseline: at 49 cm Goal status: INITIAL       PLAN:  PT FREQUENCY: 2x/week-3x week  PT DURATION: 10 weeks  PLANNED INTERVENTIONS: 97164- PT Re-evaluation, 97110-Therapeutic exercises, 97530- Therapeutic activity, 97112- Neuromuscular  re-education, 97535- Self Care, 02859- Manual therapy, [phone]- Gait training, [phone]- Vasopneumatic device, Patient/Family education, Stair training, Joint mobilization, and Moist heat  PLAN FOR NEXT SESSION: Continue for ROM increases in knee.   Burnard CHRISTELLA Meth, PT 07/18/2024, 10:15 AM

## 2024-07-18 ENCOUNTER — Ambulatory Visit

## 2024-07-18 DIAGNOSIS — M25662 Stiffness of left knee, not elsewhere classified: Secondary | ICD-10-CM | POA: Diagnosis not present

## 2024-07-18 DIAGNOSIS — R600 Localized edema: Secondary | ICD-10-CM | POA: Diagnosis not present

## 2024-07-18 DIAGNOSIS — R2689 Other abnormalities of gait and mobility: Secondary | ICD-10-CM

## 2024-07-18 DIAGNOSIS — M6281 Muscle weakness (generalized): Secondary | ICD-10-CM

## 2024-07-18 DIAGNOSIS — M25562 Pain in left knee: Secondary | ICD-10-CM

## 2024-07-19 NOTE — Therapy (Signed)
 OUTPATIENT PHYSICAL THERAPY LOWER EXTREMITY TREATMENT   Patient Name: Melissa Morrow MRN: [id number] DOB:January 01, 1956, 68 y.o., female Today's Date: 07/20/2024  END OF SESSION:  PT End of Session - 07/20/24 1229     Visit Number 6    Number of Visits 21    Date for Recertification  09/07/24    Activity Tolerance Patient tolerated treatment well    Behavior During Therapy [facility] for tasks assessed/performed               Past Medical History:  Diagnosis Date   Arthritis    Depression    DVT (deep venous thrombosis) (HCC)    Confirm location with patient.   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   BUNIONECTOMY  1987 and 1996   CESAREAN SECTION  1989 and 1991   DILATION AND CURETTAGE OF UTERUS  1988   GALLBLADDER SURGERY  09/21/2002   laparoscopic   HERNIA REPAIR  04/21/2024   umbilical hernia   Placement of [place] Filter  09/21/1986   TOTAL KNEE ARTHROPLASTY Left 06/16/2024   Procedure: ARTHROPLASTY, KNEE, TOTAL;  Surgeon: Vernetta Lonni GRADE, MD;  Location: [facility];  Service: Orthopedics;  Laterality: Left;   Patient Active Problem List   Diagnosis Date Noted   Status post total left knee replacement 06/16/2024   High blood pressure 03/11/2011   Hearing loss 03/11/2011   Contact lens/glasses fitting 03/11/2011   Menopause 03/11/2011   Family history of breast cancer 03/11/2011    PCP: [name], MD   REFERRING PROVIDER: Vernetta Lonni GRADE*   REFERRING DIAG:  Diagnosis  M17.12 (ICD-10-CM) - Unilateral primary osteoarthritis, left knee  *Left total knee arthroplasty to be done on 06/16/24. Will have HHPT x 2 weeks prior to OPPT. Eval and treat OPPT. Will be ready by post op visit with Dr. Vernetta on 06/29/24.   THERAPY DIAG:  Acute pain of left  knee  Localized edema  Stiffness of left knee, not elsewhere classified  Muscle weakness (generalized)  Other abnormalities of gait and mobility  Rationale for Evaluation and Treatment: Rehabilitation  ONSET DATE: surgery 06/16/24 L TKA  SUBJECTIVE:   SUBJECTIVE STATEMENT: Pt reports she can not get a full night's sleep without medication.   PERTINENT HISTORY: + hx/o large blood clot in L LE leading to + hx/o swelling/lyphedema PAIN:  Are you having pain? Yes: NPRS scale: 2/10 Pain location: anterior mostly Pain description: sharp Aggravating factors: cpm, bending, walking Relieving factors: rest, medication  PRECAUTIONS: None  RED FLAGS: None   WEIGHT BEARING RESTRICTIONS: No  FALLS:  Has patient fallen in last 6 months? No  LIVING ENVIRONMENT: Lives with: lives with their spouse Lives in: House/apartment Stairs: Yes: External: 4 steps; can reach both Has following equipment at home: Single point cane  OCCUPATION: retired  PLOF: Independent  PATIENT GOALS: decrease pain and increase walking  NEXT MD VISIT: 08/02/24  OBJECTIVE:  Note: Objective measures were completed at Evaluation unless otherwise noted.  DIAGNOSTIC FINDINGS: 03/14/24  An AP and lateral of the left knee shows valgus malalignment with  tricompartment arthritis.  There are osteophytes in all 3 compartments  especially the medial compartment and patellofemoral joint.   PATIENT SURVEYS:  PSFS: THE PATIENT SPECIFIC FUNCTIONAL SCALE  Place score of 0-10 (0 = unable to perform activity and 10 = able to perform activity at the same level as before injury or problem)  Activity Date: 06/29/24    Walking quickly 4    2.bending knee/squatting 5    3.transfers 7    4.      Total Score 5.33      Total Score = Sum of activity scores/number of activities  Minimally Detectable Change: 3 points (for single activity); 2 points (for average score)  [name] (nd). The Patient  Specific Functional Scale . Retrieved from [employer].com.[email or web address]   COGNITION: Overall cognitive status: Within functional limits for tasks assessed     SENSATION: WFL  EDEMA:  Circumferential: R leg 38 cm, L leg 49 cm  MUSCLE LENGTH: Hamstrings: Left moderate restriction  POSTURE: No Significant postural limitations  PALPATION: 1+ anterior L knee and jt line   LOWER EXTREMITY ROM:  A/P ROM Right eval Left eval Left 07/18/24 Left 07/20/24  Hip flexion      Hip extension      Hip abduction      Hip adduction      Hip internal rotation      Hip external rotation      Knee flexion  80/ 85 95/100 105 A supine  Knee extension  +11/ +8    Ankle dorsiflexion      Ankle plantarflexion      Ankle inversion      Ankle eversion       (Blank rows = not tested)  LOWER EXTREMITY MMT:  MMT Right eval Left eval  Hip flexion  4+  Hip extension    Hip abduction  4  Hip adduction    Hip internal rotation    Hip external rotation    Knee flexion  4-  Knee extension  3+  Ankle dorsiflexion    Ankle plantarflexion    Ankle inversion    Ankle eversion     (Blank rows = not tested)  LOWER EXTREMITY SPECIAL TESTS:  S/P- no special tests  FUNCTIONAL TESTS:  5 times sit to stand: 16 sec  GAIT: Distance walked: household Assistive device utilized: Single point cane Level of assistance: Complete Independence Comments: ordering SPC for home                                                                                                                                TREATMENT DATE: L TKA 07/20/24 Rec bike level 2 8 min Leg Press  87# 3x10 #37 2x10 Seated knee flexion with slider 2x10 Squats 2x10 LAQ 4#  3x10 Standing Tband TKE green 2x10 Lean ins 8 inch step 10x Heel raises Vaso with R LE elevation at 36 degrees ands medium compression x10 min   07/18/24 Rec bike level 1 8 min Leg Press  87# 3x10  Seated knee  flexion with slider 2x10 Squats 2x10 LAQ 4#  3x10  Tandem 3x30 sec each foot Standing Tband TKE green 2x10 Lean ins 8 inch step 10x Heel raises  Stair training   Vaso with R LE elevation at 36 degrees ands medium compression x10 min    07/13/24 Rec bike level 1 8 min Leg Press  81# 3x10  Seated knee flexion with slider 2x10 Squats 2x10 LAQ 4#  3x10 Tandem 3x30 sec each foot Standing Tband TKE green 2x10 Lean ins 6 inch step 10x Vaso with R LE elevation at 36 degrees ands medium compression x10 min   07/11/24 Rec bike level 1 7 min Leg Press  75# 3x10  Seated knee flexion with slider 2x10 Squats 2x10 LAQ 3#  3x10 Tandem 3x30 sec each foot Incline board stretch 3x30 sec Standing Tband TKE green 2x10 Vaso with R LE elevation at 36 degrees ands medium compression x10 min   07/04/24  Nustep level 3 7 min LAQ 2#  3x10 L LE on 6 box 10x lean in Tandem 3x30 sec each foot Incline board stretch 3x30 sec Standing Tband TKE green 2x10 SAQ 2.5# [employer] with R LE elevation at 36 degrees ands medium compression x10 min    06/29/24 Initial evaluation of L knee completed  followed by review, instruction and trial set of HEP.  Self care for edema control and use of CPM machine discussed.  Vaso:  Lt LE elevated on wedge with extra pillows for increased height.  Med compression and 36 degrees for 10 minutes.  PATIENT EDUCATION:  Education details: HEP Person educated: Patient Education method: Explanation, Demonstration, Actor cues, and Verbal cues Education comprehension: verbalized understanding, returned demonstration, and verbal cues required  HOME EXERCISE PROGRAM: Access Code: [email or web address] URL: [email or web address] Date: 06/29/2024 Prepared by: Burnard Meth  Exercises - Supine Quad Set  - 2 x daily - 7 x weekly - 2 sets - 10 reps - 3 hold - Supine Active Straight Leg Raise  - 2 x daily - 7 x weekly - 2 sets - 10 reps - Supine Heel Slide  - 2 x  daily - 7 x weekly - 2 sets - 10 reps - 3 hold - Mini Squat with Counter Support  - 1 x daily - 7 x weekly - 2 sets - 10 reps - 2 hold  ASSESSMENT:  CLINICAL IMPRESSION: Pt with increasing ROM as demonstrated by improved flexion to 100+ degrees.Discussed pain control options for sleep time.  Pt verbalized understanding. OBJECTIVE IMPAIRMENTS: decreased ROM, decreased strength, increased edema, impaired flexibility, and pain.   ACTIVITY LIMITATIONS: bending, sitting, standing, squatting, sleeping, stairs, and transfers  PARTICIPATION LIMITATIONS: meal prep, cleaning, driving, shopping, and community activity  PERSONAL FACTORS: 1 comorbidity: Lymphedema are also affecting patient's functional outcome.   REHAB POTENTIAL: Good  CLINICAL DECISION MAKING: Stable/uncomplicated  EVALUATION COMPLEXITY: Moderate   GOALS: Goals reviewed with patient? Yes  SHORT TERM GOALS: Target date: 07/20/2024  3 weeks   Patient to be independent with HEP. Baseline: Goal status: MET 07/12/24  2.  Decreased pain by 1 level. Baseline:  Goal status: MET 07/12/24  3.  Reduce edema by 2 cm. Baseline:  Goal status: INITIAL   LONG TERM GOALS: Target date: 09/07/2024  10 weeks    Patient to be independent with self progressive HEP by time of discharge.              Baseline:  Goal status: INITIAL  2.  Increase active range of motion to 0 degrees extension.  Baseline:  Goal status: INITIAL  3.  Increase active flexion to 105 or more degrees of flexion. Baseline:  Goal status: INITIAL  4.  Increase strength by 1 full grade of left lower extremity at time of discharge. Baseline:  Goal status: INITIAL  5.  Patient able to ambulate short community distances with no assistive device and minimal to no pain at discharge. Baseline: household Goal status: INITIAL  6.  Increased score of PS FS to greater than 7.33. Baseline: 5.33 Goal status: INITIAL  7. Reduce max pain to 2/10 with  all activities. Baseline: 4/10 Goal status: INITIAL  8. Reduce edema by > 5cm in LLE. Baseline: at 49 cm Goal status: INITIAL       PLAN:  PT FREQUENCY: 2x/week-3x week  PT DURATION: 10 weeks  PLANNED INTERVENTIONS: 97164- PT Re-evaluation, 97110-Therapeutic exercises, 97530- Therapeutic activity, 97112- Neuromuscular re-education, 97535- Self Care, 02859- Manual therapy, [phone]- Gait training, [phone]- Vasopneumatic device, Patient/Family education, Stair training, Joint mobilization, and Moist heat  PLAN FOR NEXT SESSION: Continue for ROM increases in knee.   Burnard CHRISTELLA Meth, PT 07/20/2024, 12:30 PM

## 2024-07-20 ENCOUNTER — Ambulatory Visit (INDEPENDENT_AMBULATORY_CARE_PROVIDER_SITE_OTHER)

## 2024-07-20 DIAGNOSIS — M25662 Stiffness of left knee, not elsewhere classified: Secondary | ICD-10-CM

## 2024-07-20 DIAGNOSIS — M25562 Pain in left knee: Secondary | ICD-10-CM

## 2024-07-20 DIAGNOSIS — R600 Localized edema: Secondary | ICD-10-CM

## 2024-07-20 DIAGNOSIS — M6281 Muscle weakness (generalized): Secondary | ICD-10-CM

## 2024-07-20 DIAGNOSIS — R2689 Other abnormalities of gait and mobility: Secondary | ICD-10-CM

## 2024-07-23 NOTE — Therapy (Signed)
 OUTPATIENT PHYSICAL THERAPY LOWER EXTREMITY TREATMENT   Patient Name: Melissa Morrow MRN: [id number] DOB:10-May-1956, 68 y.o., female Today's Date: 07/25/2024  END OF SESSION:  PT End of Session - 07/25/24 1158     Visit Number 7    Number of Visits 21    Date for Recertification  09/07/24    PT Start Time 0715    PT Stop Time 0803    PT Time Calculation (min) 48 min    Activity Tolerance Patient tolerated treatment well    Behavior During Therapy [facility] for tasks assessed/performed                Past Medical History:  Diagnosis Date   Arthritis    Depression    DVT (deep venous thrombosis) (HCC)    Confirm location with patient.   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   BUNIONECTOMY  1987 and 1996   CESAREAN SECTION  1989 and 1991   DILATION AND CURETTAGE OF UTERUS  1988   GALLBLADDER SURGERY  09/21/2002   laparoscopic   HERNIA REPAIR  04/21/2024   umbilical hernia   Placement of [place] Filter  09/21/1986   TOTAL KNEE ARTHROPLASTY Left 06/16/2024   Procedure: ARTHROPLASTY, KNEE, TOTAL;  Surgeon: Vernetta Lonni GRADE, MD;  Location: [facility];  Service: Orthopedics;  Laterality: Left;   Patient Active Problem List   Diagnosis Date Noted   Status post total left knee replacement 06/16/2024   High blood pressure 03/11/2011   Hearing loss 03/11/2011   Contact lens/glasses fitting 03/11/2011   Menopause 03/11/2011   Family history of breast cancer 03/11/2011    PCP: [name], MD   REFERRING PROVIDER: [name], MD   REFERRING DIAG:  Diagnosis  M17.12 (ICD-10-CM) - Unilateral primary osteoarthritis, left knee  *Left total knee arthroplasty to be done on 06/16/24. Will have HHPT x 2 weeks prior to OPPT. Eval and treat OPPT. Will be ready by post op visit  with Dr. Vernetta on 06/29/24.   THERAPY DIAG:  Acute pain of left knee  Localized edema  Stiffness of left knee, not elsewhere classified  Muscle weakness (generalized)  Other abnormalities of gait and mobility  Rationale for Evaluation and Treatment: Rehabilitation  ONSET DATE: surgery 06/16/24 L TKA  SUBJECTIVE:   SUBJECTIVE STATEMENT: Pt reports she has started sleeping much better and the pain is under control.  PERTINENT HISTORY: + hx/o large blood clot in L LE leading to + hx/o swelling/lyphedema PAIN:  Are you having pain? Yes: NPRS scale: 1/10 Pain location: anterior mostly Pain description: sharp Aggravating factors: cpm, bending, walking Relieving factors: rest, medication  PRECAUTIONS: None  RED FLAGS: None   WEIGHT BEARING RESTRICTIONS: No  FALLS:  Has patient fallen in last 6 months? No  LIVING ENVIRONMENT: Lives with: lives with their spouse Lives in: House/apartment Stairs: Yes: External: 4 steps; can reach both Has following equipment at home: Single point cane  OCCUPATION: retired  PLOF: Independent  PATIENT GOALS: decrease pain and increase walking  NEXT MD VISIT: 08/02/24  OBJECTIVE:  Note: Objective measures were completed at Evaluation unless otherwise noted.  DIAGNOSTIC FINDINGS: 03/14/24  An AP and lateral of the left knee shows valgus malalignment with  tricompartment arthritis.  There are osteophytes in all 3 compartments  especially the medial compartment and patellofemoral joint.   PATIENT SURVEYS:  PSFS: THE PATIENT SPECIFIC FUNCTIONAL SCALE  Place score of 0-10 (0 = unable to perform activity and 10 = able to perform activity at the same level as before injury or problem)  Activity Date: 06/29/24    Walking quickly 4    2.bending knee/squatting 5    3.transfers 7    4.      Total Score 5.33      Total Score = Sum of activity scores/number of activities  Minimally Detectable Change: 3 points (for single activity); 2  points (for average score)  [name] (nd). The Patient Specific Functional Scale . Retrieved from Skateoasis.com.pt   COGNITION: Overall cognitive status: Within functional limits for tasks assessed     SENSATION: WFL  EDEMA:  Circumferential: R leg 38 cm, L leg 49 cm  MUSCLE LENGTH: Hamstrings: Left moderate restriction  POSTURE: No Significant postural limitations  PALPATION: 1+ anterior L knee and jt line   LOWER EXTREMITY ROM:  A/P ROM Right eval Left eval Left 07/18/24 Left 07/20/24  Hip flexion      Hip extension      Hip abduction      Hip adduction      Hip internal rotation      Hip external rotation      Knee flexion  80/ 85 95/100 105 A supine  Knee extension  +11/ +8    Ankle dorsiflexion      Ankle plantarflexion      Ankle inversion      Ankle eversion       (Blank rows = not tested)  LOWER EXTREMITY MMT:  MMT Right eval Left eval  Hip flexion  4+  Hip extension    Hip abduction  4  Hip adduction    Hip internal rotation    Hip external rotation    Knee flexion  4-  Knee extension  3+  Ankle dorsiflexion    Ankle plantarflexion    Ankle inversion    Ankle eversion     (Blank rows = not tested)  LOWER EXTREMITY SPECIAL TESTS:  S/P- no special tests  FUNCTIONAL TESTS:  5 times sit to stand: 16 sec  GAIT: Distance walked: household Assistive device utilized: Single point cane Level of assistance: Complete Independence Comments: ordering SPC for home                                                                                                                                TREATMENT DATE: L TKA 07/25/24 Rec bike level 2 8 min Leg Press  100# 3x10 #50 2x10 Seated knee flexion with slider 2x10 Squats 2x10 LAQ 4#  3x10 Standing Tband TKE green 2x10 Lean ins 8 inch step 10x Heel raises Gait training for weight shift and stairs Vaso with R LE elevation at  36 degrees ands medium compression x10 min   07/20/24 Rec bike level 2 8 min Leg Press  87# 3x10 #37 2x10 Seated knee flexion with slider 2x10 Squats 2x10 LAQ 4#  3x10 Standing Tband TKE green 2x10 Lean ins 8 inch step 10x Heel raises Vaso with R LE elevation at 36 degrees ands medium compression x10 min   07/18/24 Rec bike level 1 8 min Leg Press  87# 3x10  Seated knee flexion with slider 2x10 Squats 2x10 LAQ 4#  3x10 Tandem 3x30 sec each foot Standing Tband TKE green 2x10 Lean ins 8 inch step 10x Heel raises  Stair training   Vaso with R LE elevation at 36 degrees ands medium compression x10 min    07/13/24 Rec bike level 1 8 min Leg Press  81# 3x10  Seated knee flexion with slider 2x10 Squats 2x10 LAQ 4#  3x10 Tandem 3x30 sec each foot Standing Tband TKE green 2x10 Lean ins 6 inch step 10x Vaso with R LE elevation at 36 degrees ands medium compression x10 min   07/11/24 Rec bike level 1 7 min Leg Press  75# 3x10  Seated knee flexion with slider 2x10 Squats 2x10 LAQ 3#  3x10 Tandem 3x30 sec each foot Incline board stretch 3x30 sec Standing Tband TKE green 2x10 Vaso with R LE elevation at 36 degrees ands medium compression x10 min   07/04/24  Nustep level 3 7 min LAQ 2#  3x10 L LE on 6 box 10x lean in Tandem 3x30 sec each foot Incline board stretch 3x30 sec Standing Tband TKE green 2x10 SAQ 2.5# [employer] with R LE elevation at 36 degrees ands medium compression x10 min    06/29/24 Initial evaluation of L knee completed  followed by review, instruction and trial set of HEP.  Self care for edema control and use of CPM machine discussed.  Vaso:  Lt LE elevated on wedge with extra pillows for increased height.  Med compression and 36 degrees for 10 minutes.  PATIENT EDUCATION:  Education details: HEP Person educated: Patient Education method: Explanation, Demonstration, Actor cues, and Verbal cues Education comprehension: verbalized  understanding, returned demonstration, and verbal cues required  HOME EXERCISE PROGRAM: Access Code: [email or web address] URL: [email or web address] Date: 06/29/2024 Prepared by: Burnard Meth  Exercises - Supine Quad Set  - 2 x daily - 7 x weekly - 2 sets - 10 reps - 3 hold - Supine Active Straight Leg Raise  - 2 x daily - 7 x weekly - 2 sets - 10 reps - Supine Heel Slide  - 2 x daily - 7 x weekly - 2 sets - 10 reps - 3 hold - Mini Squat with Counter Support  - 1 x daily - 7 x weekly - 2 sets - 10 reps - 2 hold  ASSESSMENT:  CLINICAL IMPRESSION: Pt able to alternate ascending and descending 1 flight of stairs with aid of handrail and VC.  OBJECTIVE IMPAIRMENTS: decreased ROM, decreased strength, increased edema, impaired flexibility, and pain.   ACTIVITY LIMITATIONS: bending, sitting, standing, squatting, sleeping, stairs, and transfers  PARTICIPATION LIMITATIONS: meal prep, cleaning, driving, shopping, and community activity  PERSONAL FACTORS: 1 comorbidity: Lymphedema are also affecting patient's functional outcome.   REHAB POTENTIAL: Good  CLINICAL DECISION MAKING: Stable/uncomplicated  EVALUATION COMPLEXITY: Moderate   GOALS: Goals reviewed with patient? Yes  SHORT TERM GOALS: Target date: 07/20/2024  3 weeks   Patient to be independent with HEP. Baseline: Goal status: MET 07/12/24  2.  Decreased pain  by 1 level. Baseline:  Goal status: MET 07/12/24  3.  Reduce edema by 2 cm. Baseline:  Goal status: INITIAL   LONG TERM GOALS: Target date: 09/07/2024  10 weeks    Patient to be independent with self progressive HEP by time of discharge.              Baseline:  Goal status: INITIAL  2.  Increase active range of motion to 0 degrees extension.           Baseline:  Goal status: INITIAL  3.  Increase active flexion to 105 or more degrees of flexion. Baseline:  Goal status: INITIAL  4.  Increase strength by 1 full grade of left lower extremity at time  of discharge. Baseline:  Goal status: INITIAL  5.  Patient able to ambulate short community distances with no assistive device and minimal to no pain at discharge. Baseline: household Goal status: INITIAL  6.  Increased score of PS FS to greater than 7.33. Baseline: 5.33 Goal status: INITIAL  7. Reduce max pain to 2/10 with all activities. Baseline: 4/10 Goal status: INITIAL  8. Reduce edema by > 5cm in LLE. Baseline: at 49 cm Goal status: INITIAL       PLAN:  PT FREQUENCY: 2x/week-3x week  PT DURATION: 10 weeks  PLANNED INTERVENTIONS: 97164- PT Re-evaluation, 97110-Therapeutic exercises, 97530- Therapeutic activity, 97112- Neuromuscular re-education, 97535- Self Care, 02859- Manual therapy, [phone]- Gait training, [phone]- Vasopneumatic device, Patient/Family education, Stair training, Joint mobilization, and Moist heat  PLAN FOR NEXT SESSION: Continue for ROM/strength increases in knee.   Burnard CHRISTELLA Meth, PT 07/25/2024, 11:59 AM

## 2024-07-24 ENCOUNTER — Encounter: Payer: Self-pay | Admitting: Radiology

## 2024-07-25 ENCOUNTER — Ambulatory Visit (INDEPENDENT_AMBULATORY_CARE_PROVIDER_SITE_OTHER)

## 2024-07-25 DIAGNOSIS — M25662 Stiffness of left knee, not elsewhere classified: Secondary | ICD-10-CM | POA: Diagnosis not present

## 2024-07-25 DIAGNOSIS — M6281 Muscle weakness (generalized): Secondary | ICD-10-CM | POA: Diagnosis not present

## 2024-07-25 DIAGNOSIS — R600 Localized edema: Secondary | ICD-10-CM | POA: Diagnosis not present

## 2024-07-25 DIAGNOSIS — M25562 Pain in left knee: Secondary | ICD-10-CM | POA: Diagnosis not present

## 2024-07-25 DIAGNOSIS — R2689 Other abnormalities of gait and mobility: Secondary | ICD-10-CM

## 2024-07-26 NOTE — Therapy (Signed)
 OUTPATIENT PHYSICAL THERAPY LOWER EXTREMITY TREATMENT   Patient Name: Melissa Morrow MRN: [id number] DOB:1956-03-26, 68 y.o., female Today's Date: 07/27/2024  END OF SESSION:  PT End of Session - 07/27/24 0926     Visit Number 8    Number of Visits 21    Date for Recertification  09/07/24    PT Start Time 0845    PT Stop Time 0933    PT Time Calculation (min) 48 min    Activity Tolerance Patient tolerated treatment well    Behavior During Therapy [facility] for tasks assessed/performed                 Past Medical History:  Diagnosis Date   Arthritis    Depression    DVT (deep venous thrombosis) (HCC)    Confirm location with patient.   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   BUNIONECTOMY  1987 and 1996   CESAREAN SECTION  1989 and 1991   DILATION AND CURETTAGE OF UTERUS  1988   GALLBLADDER SURGERY  09/21/2002   laparoscopic   HERNIA REPAIR  04/21/2024   umbilical hernia   Placement of [place] Filter  09/21/1986   TOTAL KNEE ARTHROPLASTY Left 06/16/2024   Procedure: ARTHROPLASTY, KNEE, TOTAL;  Surgeon: Vernetta Lonni GRADE, MD;  Location: [facility];  Service: Orthopedics;  Laterality: Left;   Patient Active Problem List   Diagnosis Date Noted   Status post total left knee replacement 06/16/2024   High blood pressure 03/11/2011   Hearing loss 03/11/2011   Contact lens/glasses fitting 03/11/2011   Menopause 03/11/2011   Family history of breast cancer 03/11/2011    PCP: [name], MD   REFERRING PROVIDER: Vernetta Lonni GRADE*   REFERRING DIAG:  Diagnosis  M17.12 (ICD-10-CM) - Unilateral primary osteoarthritis, left knee  *Left total knee arthroplasty to be done on 06/16/24. Will have HHPT x 2 weeks prior to OPPT. Eval and treat OPPT. Will be ready by post op  visit with Dr. Vernetta on 06/29/24.   THERAPY DIAG:  Acute pain of left knee  Localized edema  Stiffness of left knee, not elsewhere classified  Muscle weakness (generalized)  Other abnormalities of gait and mobility  Rationale for Evaluation and Treatment: Rehabilitation  ONSET DATE: surgery 06/16/24 L TKA  SUBJECTIVE:   SUBJECTIVE STATEMENT: Pt reports sleeping continues to be good.  She also took a walk for 1.6 miles without difficulty.  PERTINENT HISTORY: + hx/o large blood clot in L LE leading to + hx/o swelling/lyphedema PAIN:  Are you having pain? Yes: NPRS scale: 1/10 Pain location: anterior mostly Pain description: sharp Aggravating factors: cpm, bending, walking Relieving factors: rest, medication  PRECAUTIONS: None  RED FLAGS: None   WEIGHT BEARING RESTRICTIONS: No  FALLS:  Has patient fallen in last 6 months? No  LIVING ENVIRONMENT: Lives with: lives with their spouse Lives in: House/apartment Stairs: Yes: External: 4 steps; can reach both Has following equipment at home: Single point cane  OCCUPATION: retired  PLOF: Independent  PATIENT GOALS: decrease pain and increase walking  NEXT MD VISIT: 08/02/24  OBJECTIVE:  Note: Objective measures were completed at Evaluation unless otherwise noted.  DIAGNOSTIC FINDINGS: 03/14/24  An AP and lateral of the left knee shows valgus malalignment with  tricompartment arthritis.  There are osteophytes in all 3 compartments  especially the medial compartment and patellofemoral joint.   PATIENT SURVEYS:  PSFS: THE PATIENT SPECIFIC FUNCTIONAL SCALE  Place score of 0-10 (0 = unable to perform activity and 10 = able to perform activity at the same level as before injury or problem)  Activity Date: 06/29/24    Walking quickly 4    2.bending knee/squatting 5    3.transfers 7    4.      Total Score 5.33      Total Score = Sum of activity scores/number of activities  Minimally Detectable Change: 3 points  (for single activity); 2 points (for average score)  [name] [facility] [facility] (nd). The Patient Specific Functional Scale . Retrieved from [employer].com.pt   COGNITION: Overall cognitive status: Within functional limits for tasks assessed     SENSATION: WFL  EDEMA:  Circumferential: R leg 38 cm, L leg 49 cm  MUSCLE LENGTH: Hamstrings: Left moderate restriction  POSTURE: No Significant postural limitations  PALPATION: 1+ anterior L knee and jt line   LOWER EXTREMITY ROM:  A/P ROM Right eval Left eval Left 07/18/24 Left 07/20/24  Hip flexion      Hip extension      Hip abduction      Hip adduction      Hip internal rotation      Hip external rotation      Knee flexion  80/ 85 95/100 105 A supine  Knee extension  +11/ +8    Ankle dorsiflexion      Ankle plantarflexion      Ankle inversion      Ankle eversion       (Blank rows = not tested)  LOWER EXTREMITY MMT:  MMT Right eval Left eval  Hip flexion  4+  Hip extension    Hip abduction  4  Hip adduction    Hip internal rotation    Hip external rotation    Knee flexion  4-  Knee extension  3+  Ankle dorsiflexion    Ankle plantarflexion    Ankle inversion    Ankle eversion     (Blank rows = not tested)  LOWER EXTREMITY SPECIAL TESTS:  S/P- no special tests  FUNCTIONAL TESTS:  5 times sit to stand: 16 sec  GAIT: Distance walked: household Assistive device utilized: Single point cane Level of assistance: Complete Independence Comments: ordering SPC for home                                                                                                                                TREATMENT DATE: L TKA 07/27/24 Rec bike level 2 8 min Leg Press  106 # 3x12 #56 2x12 9 hurdles step overs 20x Backward step onto 4 step to emphasize extension 25x Walking on airex 4 laps Seated knee flexion with slider 2x10 Standing Tband TKE green 2x10 Heel  raises Vaso with  R LE elevation at 36 degrees ands medium compression x10 min   07/25/24 Rec bike level 2 8 min Leg Press  100# 3x10 #50 2x10 Seated knee flexion with slider 2x10 Squats 2x10 LAQ 4#  3x10 Standing Tband TKE green 2x10 Lean ins 8 inch step 10x Heel raises Gait training for weight shift and stairs Vaso with R LE elevation at 36 degrees ands medium compression x10 min   07/20/24 Rec bike level 2 8 min Leg Press  87# 3x10 #37 2x10 Seated knee flexion with slider 2x10 Squats 2x10 LAQ 4#  3x10 Standing Tband TKE green 2x10 Lean ins 8 inch step 10x Heel raises Vaso with R LE elevation at 36 degrees ands medium compression x10 min   07/18/24 Rec bike level 1 8 min Leg Press  87# 3x10  Seated knee flexion with slider 2x10 Squats 2x10 LAQ 4#  3x10 Tandem 3x30 sec each foot Standing Tband TKE green 2x10 Lean ins 8 inch step 10x Heel raises  Stair training   Vaso with R LE elevation at 36 degrees ands medium compression x10 min   06/29/24 Initial evaluation of L knee completed  followed by review, instruction and trial set of HEP.  Self care for edema control and use of CPM machine discussed.  Vaso:  Lt LE elevated on wedge with extra pillows for increased height.  Med compression and 36 degrees for 10 minutes.  PATIENT EDUCATION:  Education details: HEP Person educated: Patient Education method: Explanation, Demonstration, Actor cues, and Verbal cues Education comprehension: verbalized understanding, returned demonstration, and verbal cues required  HOME EXERCISE PROGRAM: Access Code: [email or web address] URL: [email or web address] Date: 06/29/2024 Prepared by: Burnard Meth  Exercises - Supine Quad Set  - 2 x daily - 7 x weekly - 2 sets - 10 reps - 3 hold - Supine Active Straight Leg Raise  - 2 x daily - 7 x weekly - 2 sets - 10 reps - Supine Heel Slide  - 2 x daily - 7 x weekly - 2 sets - 10 reps - 3 hold - Mini Squat with Counter Support  - 1 x  daily - 7 x weekly - 2 sets - 10 reps - 2 hold  ASSESSMENT:  CLINICAL IMPRESSION: Pt needed handrails of // bars for independent adjustments for balance with [employer] walks.  OBJECTIVE IMPAIRMENTS: decreased ROM, decreased strength, increased edema, impaired flexibility, and pain.   ACTIVITY LIMITATIONS: bending, sitting, standing, squatting, sleeping, stairs, and transfers  PARTICIPATION LIMITATIONS: meal prep, cleaning, driving, shopping, and community activity  PERSONAL FACTORS: 1 comorbidity: Lymphedema are also affecting patient's functional outcome.   REHAB POTENTIAL: Good  CLINICAL DECISION MAKING: Stable/uncomplicated  EVALUATION COMPLEXITY: Moderate   GOALS: Goals reviewed with patient? Yes  SHORT TERM GOALS: Target date: 07/20/2024  3 weeks   Patient to be independent with HEP. Baseline: Goal status: MET 07/12/24  2.  Decreased pain by 1 level. Baseline:  Goal status: MET 07/12/24  3.  Reduce edema by 2 cm. Baseline:  Goal status: INITIAL   LONG TERM GOALS: Target date: 09/07/2024  10 weeks    Patient to be independent with self progressive HEP by time of discharge.              Baseline:  Goal status: INITIAL  2.  Increase active range of motion to 0 degrees extension.           Baseline:  Goal status: INITIAL  3.  Increase active flexion to 105 or more degrees  of flexion. Baseline:  Goal status: INITIAL  4.  Increase strength by 1 full grade of left lower extremity at time of discharge. Baseline:  Goal status: INITIAL  5.  Patient able to ambulate short community distances with no assistive device and minimal to no pain at discharge. Baseline: household Goal status: INITIAL  6.  Increased score of PS FS to greater than 7.33. Baseline: 5.33 Goal status: INITIAL  7. Reduce max pain to 2/10 with all activities. Baseline: 4/10 Goal status: INITIAL  8. Reduce edema by > 5cm in LLE. Baseline: at 49 cm Goal status:  INITIAL       PLAN:  PT FREQUENCY: 2x/week-3x week  PT DURATION: 10 weeks  PLANNED INTERVENTIONS: 97164- PT Re-evaluation, 97110-Therapeutic exercises, 97530- Therapeutic activity, 97112- Neuromuscular re-education, 97535- Self Care, 02859- Manual therapy, [phone]- Gait training, [phone]- Vasopneumatic device, Patient/Family education, Stair training, Joint mobilization, and Moist heat  PLAN FOR NEXT SESSION: Continue for ROM/strength increases in knee.   Burnard CHRISTELLA Meth, PT 07/27/2024, 9:28 AM

## 2024-07-27 ENCOUNTER — Ambulatory Visit (INDEPENDENT_AMBULATORY_CARE_PROVIDER_SITE_OTHER)

## 2024-07-27 DIAGNOSIS — M6281 Muscle weakness (generalized): Secondary | ICD-10-CM | POA: Diagnosis not present

## 2024-07-27 DIAGNOSIS — M25662 Stiffness of left knee, not elsewhere classified: Secondary | ICD-10-CM

## 2024-07-27 DIAGNOSIS — R2689 Other abnormalities of gait and mobility: Secondary | ICD-10-CM

## 2024-07-27 DIAGNOSIS — R600 Localized edema: Secondary | ICD-10-CM | POA: Diagnosis not present

## 2024-07-27 DIAGNOSIS — M25562 Pain in left knee: Secondary | ICD-10-CM | POA: Diagnosis not present

## 2024-07-31 NOTE — Therapy (Signed)
 OUTPATIENT PHYSICAL THERAPY LOWER EXTREMITY TREATMENT   Patient Name: Melissa Morrow MRN: [id number] DOB:07/08/1956, 68 y.o., female Today's Date: 08/01/2024  END OF SESSION:  PT End of Session - 08/01/24 0752     Visit Number 9    Number of Visits 21    Date for Recertification  09/07/24    PT Start Time 0715    PT Stop Time 0803    PT Time Calculation (min) 48 min    Activity Tolerance Patient tolerated treatment well    Behavior During Therapy [facility] for tasks assessed/performed                  Past Medical History:  Diagnosis Date   Arthritis    Depression    DVT (deep venous thrombosis) (HCC)    Confirm location with patient.   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   BUNIONECTOMY  1987 and 1996   CESAREAN SECTION  1989 and 1991   DILATION AND CURETTAGE OF UTERUS  1988   GALLBLADDER SURGERY  09/21/2002   laparoscopic   HERNIA REPAIR  04/21/2024   umbilical hernia   Placement of Greenfield Filter  09/21/1986   TOTAL KNEE ARTHROPLASTY Left 06/16/2024   Procedure: ARTHROPLASTY, KNEE, TOTAL;  Surgeon: Melissa Lonni GRADE, MD;  Location: [facility];  Service: Orthopedics;  Laterality: Left;   Patient Active Problem List   Diagnosis Date Noted   Status post total left knee replacement 06/16/2024   High blood pressure 03/11/2011   Hearing loss 03/11/2011   Contact lens/glasses fitting 03/11/2011   Menopause 03/11/2011   Family history of breast cancer 03/11/2011    PCP: Melissa Elsie JONETTA Mickey., MD   REFERRING PROVIDER: [name], MD   REFERRING DIAG:  Diagnosis  M17.12 (ICD-10-CM) - Unilateral primary osteoarthritis, left knee  *Left total knee arthroplasty to be done on 06/16/24. Will have HHPT x 2 weeks prior to OPPT. Eval and treat OPPT. Will be ready by post op  visit with Dr. Vernetta on 06/29/24.   THERAPY DIAG:  Acute pain of left knee  Localized edema  Stiffness of left knee, not elsewhere classified  Muscle weakness (generalized)  Other abnormalities of gait and mobility  Rationale for Evaluation and Treatment: Rehabilitation  ONSET DATE: surgery 06/16/24 L TKA  SUBJECTIVE:   SUBJECTIVE STATEMENT: Pt reports minimal pain now. PERTINENT HISTORY: + hx/o large blood clot in L LE leading to + hx/o swelling/lyphedema PAIN:  Are you having pain? Yes: NPRS scale: 1/10 Pain location: anterior mostly Pain description: sharp Aggravating factors: cpm, bending, walking Relieving factors: rest, medication  PRECAUTIONS: None  RED FLAGS: None   WEIGHT BEARING RESTRICTIONS: No  FALLS:  Has patient fallen in last 6 months? No  LIVING ENVIRONMENT: Lives with: lives with their spouse Lives in: House/apartment Stairs: Yes: External: 4 steps; can reach both Has following equipment at home: Single point cane  OCCUPATION: retired  PLOF: Independent  PATIENT GOALS: decrease pain and increase walking  NEXT MD VISIT: 08/02/24  OBJECTIVE:  Note: Objective measures were completed at Evaluation unless otherwise noted.  DIAGNOSTIC FINDINGS: 03/14/24  An AP and lateral of the left knee shows valgus malalignment with  tricompartment arthritis.  There are osteophytes in all 3 compartments  especially the medial compartment and patellofemoral joint.   PATIENT SURVEYS:  PSFS: THE PATIENT SPECIFIC FUNCTIONAL SCALE  Place score of 0-10 (0 = unable to perform activity and 10 = able to perform activity at the same level as before injury or problem)  Activity Date: 06/29/24    Walking quickly 4    2.bending knee/squatting 5    3.transfers 7    4.      Total Score 5.33      Total Score = Sum of activity scores/number of activities  Minimally Detectable Change: 3 points (for single activity); 2 points (for average score)  Melissa Morrow  [facility] (nd). The Patient Specific Functional Scale . Retrieved from [email or web address].[email or web address]   COGNITION: Overall cognitive status: Within functional limits for tasks assessed     SENSATION: WFL  EDEMA:  Circumferential: R leg 38 cm, L leg 49 cm  MUSCLE LENGTH: Hamstrings: Left moderate restriction  POSTURE: No Significant postural limitations  PALPATION: 1+ anterior L knee and jt line   LOWER EXTREMITY ROM:  A/P ROM Right eval Left eval Left 07/18/24 Left 07/20/24  Hip flexion      Hip extension      Hip abduction      Hip adduction      Hip internal rotation      Hip external rotation      Knee flexion  80/ 85 95/100 105 A supine  Knee extension  +11/ +8    Ankle dorsiflexion      Ankle plantarflexion      Ankle inversion      Ankle eversion       (Blank rows = not tested)  LOWER EXTREMITY MMT:  MMT Right eval Left eval  Hip flexion  4+  Hip extension    Hip abduction  4  Hip adduction    Hip internal rotation    Hip external rotation    Knee flexion  4-  Knee extension  3+  Ankle dorsiflexion    Ankle plantarflexion    Ankle inversion    Ankle eversion     (Blank rows = not tested)  LOWER EXTREMITY SPECIAL TESTS:  S/P- no special tests  FUNCTIONAL TESTS:  5 times sit to stand: 16 sec  GAIT: Distance walked: household Assistive device utilized: Single point cane Level of assistance: Complete Independence Comments: ordering SPC for home                                                                                                                                TREATMENT DATE: L TKA 08/01/24 Rec bike level 2 8 min Leg Press  112 # 3x12 #62 2x12 9 hurdles step overs 20x Backward step onto 4 then 6step to emphasize extension 25x Standing Tband TKE green 2x10 Heel raises Incline board stretch 3x30 LAQ #4 Vaso with L LE elevation at 36 degrees ands medium compression x10  min    07/27/24  Rec bike level 2 8 min Leg Press  106 # 3x12 #56 2x12 9 hurdles step overs 20x Backward step onto 4 step to emphasize extension 25x Walking on airex 4 laps Seated knee flexion with slider 2x10 Standing Tband TKE green 2x10 Heel raises Vaso with L LE elevation at 36 degrees ands medium compression x10 min   07/25/24 Rec bike level 2 8 min Leg Press  100# 3x10 #50 2x10 Seated knee flexion with slider 2x10 Squats 2x10 LAQ 4#  3x10 Standing Tband TKE green 2x10 Lean ins 8 inch step 10x Heel raises Gait training for weight shift and stairs Vaso with L LE elevation at 36 degrees ands medium compression x10 min   07/20/24 Rec bike level 2 8 min Leg Press  87# 3x10 #37 2x10 Seated knee flexion with slider 2x10 Squats 2x10 LAQ 4#  3x10 Standing Tband TKE green 2x10 Lean ins 8 inch step 10x Heel raises Vaso with L LE elevation at 36 degrees ands medium compression x10 min   07/18/24 Rec bike level 1 8 min Leg Press  87# 3x10  Seated knee flexion with slider 2x10 Squats 2x10 LAQ 4#  3x10 Tandem 3x30 sec each foot Standing Tband TKE green 2x10 Lean ins 8 inch step 10x Heel raises  Stair training   Vaso with R LE elevation at 36 degrees ands medium compression x10 min   06/29/24 Initial evaluation of L knee completed  followed by review, instruction and trial set of HEP.  Self care for edema control and use of CPM machine discussed.  Vaso:  Lt LE elevated on wedge with extra pillows for increased height.  Med compression and 36 degrees for 10 minutes.  PATIENT EDUCATION:  Education details: HEP Person educated: Patient Education method: Explanation, Demonstration, Actor cues, and Verbal cues Education comprehension: verbalized understanding, returned demonstration, and verbal cues required  HOME EXERCISE PROGRAM: Access Code: [email or web address] URL: [email or web address] Date: 06/29/2024 Prepared by: Burnard Meth  Exercises - Supine  Quad Set  - 2 x daily - 7 x weekly - 2 sets - 10 reps - 3 hold - Supine Active Straight Leg Raise  - 2 x daily - 7 x weekly - 2 sets - 10 reps - Supine Heel Slide  - 2 x daily - 7 x weekly - 2 sets - 10 reps - 3 hold - Mini Squat with Counter Support  - 1 x daily - 7 x weekly - 2 sets - 10 reps - 2 hold  ASSESSMENT:  CLINICAL IMPRESSION: Pt with decreased edema at knee jt line to 47 cm. STG completed.  OBJECTIVE IMPAIRMENTS: decreased ROM, decreased strength, increased edema, impaired flexibility, and pain.   ACTIVITY LIMITATIONS: bending, sitting, standing, squatting, sleeping, stairs, and transfers  PARTICIPATION LIMITATIONS: meal prep, cleaning, driving, shopping, and community activity  PERSONAL FACTORS: 1 comorbidity: Lymphedema are also affecting patient's functional outcome.   REHAB POTENTIAL: Good  CLINICAL DECISION MAKING: Stable/uncomplicated  EVALUATION COMPLEXITY: Moderate   GOALS: Goals reviewed with patient? Yes  SHORT TERM GOALS: Target date: 07/20/2024  MET   Patient to be independent with HEP. Baseline: Goal status: MET 07/12/24  2.  Decreased pain by 1 level. Baseline:  Goal status: MET 07/12/24  3.  Reduce edema by 2 cm. Baseline:  Goal status: MET 08/01/24   LONG TERM GOALS: Target date: 09/07/2024  10 weeks    Patient to be independent with self progressive HEP by time of discharge.  Baseline:  Goal status: INITIAL  2.  Increase active range of motion to 0 degrees extension.           Baseline:  Goal status: INITIAL  3.  Increase active flexion to 105 or more degrees of flexion. Baseline:  Goal status: INITIAL  4.  Increase strength by 1 full Morrow of left lower extremity at time of discharge. Baseline:  Goal status: INITIAL  5.  Patient able to ambulate short community distances with no assistive device and minimal to no pain at discharge. Baseline: household Goal status: INITIAL  6.  Increased score of PS FS to  greater than 7.33. Baseline: 5.33 Goal status: INITIAL  7. Reduce max pain to 2/10 with all activities. Baseline: 4/10 Goal status: INITIAL  8. Reduce edema by > 5cm in LLE. Baseline: at 49 cm Goal status: INITIAL       PLAN:  PT FREQUENCY: 2x/week-3x week  PT DURATION: 10 weeks  PLANNED INTERVENTIONS: 97164- PT Re-evaluation, 97110-Therapeutic exercises, 97530- Therapeutic activity, 97112- Neuromuscular re-education, 97535- Self Care, 02859- Manual therapy, [phone]- Gait training, [phone]- Vasopneumatic device, Patient/Family education, Stair training, Joint mobilization, and Moist heat  PLAN FOR NEXT SESSION: Continue for ROM/strength increases in knee.   Burnard CHRISTELLA Meth, PT 08/01/2024, 8:27 AM

## 2024-08-01 ENCOUNTER — Ambulatory Visit (INDEPENDENT_AMBULATORY_CARE_PROVIDER_SITE_OTHER)

## 2024-08-01 DIAGNOSIS — M25562 Pain in left knee: Secondary | ICD-10-CM

## 2024-08-01 DIAGNOSIS — R600 Localized edema: Secondary | ICD-10-CM

## 2024-08-01 DIAGNOSIS — M25662 Stiffness of left knee, not elsewhere classified: Secondary | ICD-10-CM

## 2024-08-01 DIAGNOSIS — M6281 Muscle weakness (generalized): Secondary | ICD-10-CM

## 2024-08-01 DIAGNOSIS — R2689 Other abnormalities of gait and mobility: Secondary | ICD-10-CM

## 2024-08-02 ENCOUNTER — Ambulatory Visit (INDEPENDENT_AMBULATORY_CARE_PROVIDER_SITE_OTHER): Admitting: Orthopaedic Surgery

## 2024-08-02 ENCOUNTER — Encounter: Payer: Self-pay | Admitting: Orthopaedic Surgery

## 2024-08-02 DIAGNOSIS — Z96652 Presence of left artificial knee joint: Secondary | ICD-10-CM

## 2024-08-02 NOTE — Progress Notes (Signed)
 Melissa Morrow is now almost 7 weeks status post a left total knee replacement to treat severe left knee arthritis and pain.  She has been diligent with going to physical therapy and her home program as well.  She says she lacks full extension by just a few degrees and can flex to past 100 degrees.  She did have therapy today as well.  She is walking without assistive device and not taking any narcotics.  She looks great overall.  On exam her left knee feels stable.  There is still swelling to be expected.  Her extension is almost full and her flexion is good thus far and the knee feels stable.  She will continue to increase her activities as comfort allows.  From an orthopedic standpoint we will see her back in 3 months with a standing AP and lateral of her left operative knee.  If there are issues before then she knows to reach out and let us  know.

## 2024-08-02 NOTE — Therapy (Signed)
 OUTPATIENT PHYSICAL THERAPY LOWER EXTREMITY TREATMENT/PROGRESS NOTE   Patient Name: Melissa Morrow MRN: [id number] DOB:1956/02/04, 68 y.o., female Today's Date: 08/03/2024  END OF SESSION:  PT End of Session - 08/03/24 1057     Visit Number 10    Number of Visits 21    Date for Recertification  09/07/24    PT Start Time 0800    PT Stop Time 0848    PT Time Calculation (min) 48 min    Activity Tolerance Patient tolerated treatment well    Behavior During Therapy [facility] for tasks assessed/performed                   Past Medical History:  Diagnosis Date   Arthritis    Depression    DVT (deep venous thrombosis) (HCC)    Confirm location with patient.   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   BUNIONECTOMY  1987 and 1996   CESAREAN SECTION  1989 and 1991   DILATION AND CURETTAGE OF UTERUS  1988   GALLBLADDER SURGERY  09/21/2002   laparoscopic   HERNIA REPAIR  04/21/2024   umbilical hernia   Placement of [place] Filter  09/21/1986   TOTAL KNEE ARTHROPLASTY Left 06/16/2024   Procedure: ARTHROPLASTY, KNEE, TOTAL;  Surgeon: Vernetta Lonni GRADE, MD;  Location: [facility];  Service: Orthopedics;  Laterality: Left;   Patient Active Problem List   Diagnosis Date Noted   Status post total left knee replacement 06/16/2024   High blood pressure 03/11/2011   Hearing loss 03/11/2011   Contact lens/glasses fitting 03/11/2011   Menopause 03/11/2011   Family history of breast cancer 03/11/2011    PCP: [name], MD   REFERRING PROVIDER: Vernetta Lonni GRADE*   REFERRING DIAG:  Diagnosis  M17.12 (ICD-10-CM) - Unilateral primary osteoarthritis, left knee  *Left total knee arthroplasty to be done on 06/16/24. Will have HHPT x 2 weeks prior to OPPT. Eval and treat OPPT. Will be  ready by post op visit with Dr. Vernetta on 06/29/24.   THERAPY DIAG:  Acute pain of left knee  Localized edema  Stiffness of left knee, not elsewhere classified  Muscle weakness (generalized)  Other abnormalities of gait and mobility  Rationale for Evaluation and Treatment: Rehabilitation  ONSET DATE: surgery 06/16/24 L TKA  SUBJECTIVE:   SUBJECTIVE STATEMENT: Pt reports she saw the surgeon and he was please with the progress. PERTINENT HISTORY: + hx/o large blood clot in L LE leading to + hx/o swelling/lyphedema PAIN:  Are you having pain? Yes: NPRS scale: 0/10 Pain location: anterior mostly Pain description: sharp Aggravating factors: cpm, bending, walking Relieving factors: rest, medication  PRECAUTIONS: None  RED FLAGS: None   WEIGHT BEARING RESTRICTIONS: No  FALLS:  Has patient fallen in last 6 months? No  LIVING ENVIRONMENT: Lives with: lives with their spouse Lives in: House/apartment Stairs: Yes: External: 4 steps; can reach both Has following equipment at home: Single point cane  OCCUPATION: retired  PLOF: Independent  PATIENT GOALS: decrease pain and increase walking  NEXT MD VISIT: 08/02/24  OBJECTIVE:  Note: Objective measures were completed at Evaluation unless otherwise noted.  DIAGNOSTIC FINDINGS: 03/14/24  An AP and lateral of the left knee shows valgus malalignment with  tricompartment arthritis.  There are osteophytes in all 3 compartments  especially the medial compartment and patellofemoral joint.   PATIENT SURVEYS:  PSFS: THE PATIENT SPECIFIC FUNCTIONAL SCALE  Place score of 0-10 (0 = unable to perform activity and 10 = able to perform activity at the same level as before injury or problem)  Activity Date: 06/29/24    Walking quickly 4    2.bending knee/squatting 5    3.transfers 7    4.      Total Score 5.33      Total Score = Sum of activity scores/number of activities  Minimally Detectable Change: 3 points (for single  activity); 2 points (for average score)  [name] (nd). The Patient Specific Functional Scale . Retrieved from [email or web address]   COGNITION: Overall cognitive status: Within functional limits for tasks assessed     SENSATION: WFL  EDEMA:  Circumferential: R leg 38 cm, L leg 49 cm  MUSCLE LENGTH: Hamstrings: Left moderate restriction  POSTURE: No Significant postural limitations  PALPATION: 1+ anterior L knee and jt line   LOWER EXTREMITY ROM:***-  A/P ROM Right eval Left eval Left 07/18/24 Left 07/20/24  Hip flexion      Hip extension      Hip abduction      Hip adduction      Hip internal rotation      Hip external rotation      Knee flexion  80/ 85 95/100 105 A supine  Knee extension  +11/ +8    Ankle dorsiflexion      Ankle plantarflexion      Ankle inversion      Ankle eversion       (Blank rows = not tested)  LOWER EXTREMITY MMT:  MMT Right eval Left eval  Hip flexion  4+  Hip extension    Hip abduction  4  Hip adduction    Hip internal rotation    Hip external rotation    Knee flexion  4-  Knee extension  3+  Ankle dorsiflexion    Ankle plantarflexion    Ankle inversion    Ankle eversion     (Blank rows = not tested)  LOWER EXTREMITY SPECIAL TESTS:  S/P- no special tests  FUNCTIONAL TESTS:  5 times sit to stand: 16 sec  GAIT: Distance walked: household Assistive device utilized: Single point cane Level of assistance: Complete Independence Comments: ordering SPC for home                                                                                                                                TREATMENT DATE: L TKA 08/03/24*** Rec bike level 2 8 min Leg Press 125 # 3x12 #62 2x15 9 hurdles step overs 20x Backward step onto 4 then 6step to emphasize extension 25x Standing Tband TKE green 2x10 Heel raises Incline board stretch 3x30 LAQ #4 Vaso with L LE  elevation at 36 degrees ands medium  compression x10 min     08/01/24 Rec bike level 2 8 min Leg Press  112 # 3x12 #62 2x12 9 hurdles step overs 20x Backward step onto 4 then 6step to emphasize extension 25x Standing Tband TKE green 2x10 Heel raises Incline board stretch 3x30 LAQ #4 Vaso with L LE elevation at 36 degrees ands medium compression x10 min    07/27/24 Rec bike level 2 8 min Leg Press  106 # 3x12 #56 2x12 9 hurdles step overs 20x Backward step onto 4 step to emphasize extension 25x Walking on airex 4 laps Seated knee flexion with slider 2x10 Standing Tband TKE green 2x10 Heel raises Vaso with L LE elevation at 36 degrees ands medium compression x10 min   07/25/24 Rec bike level 2 8 min Leg Press  100# 3x10 #50 2x10 Seated knee flexion with slider 2x10 Squats 2x10 LAQ 4#  3x10 Standing Tband TKE green 2x10 Lean ins 8 inch step 10x Heel raises Gait training for weight shift and stairs Vaso with L LE elevation at 36 degrees ands medium compression x10 min   07/20/24 Rec bike level 2 8 min Leg Press  87# 3x10 #37 2x10 Seated knee flexion with slider 2x10 Squats 2x10 LAQ 4#  3x10 Standing Tband TKE green 2x10 Lean ins 8 inch step 10x Heel raises Vaso with L LE elevation at 36 degrees ands medium compression x10 min   07/18/24 Rec bike level 1 8 min Leg Press  87# 3x10  Seated knee flexion with slider 2x10 Squats 2x10 LAQ 4#  3x10 Tandem 3x30 sec each foot Standing Tband TKE green 2x10 Lean ins 8 inch step 10x Heel raises  Stair training   Vaso with R LE elevation at 36 degrees ands medium compression x10 min   06/29/24 Initial evaluation of L knee completed  followed by review, instruction and trial set of HEP.  Self care for edema control and use of CPM machine discussed.  Vaso:  Lt LE elevated on wedge with extra pillows for increased height.  Med compression and 36 degrees for 10 minutes.  PATIENT EDUCATION:  Education details:  HEP Person educated: Patient Education method: Explanation, Demonstration, Actor cues, and Verbal cues Education comprehension: verbalized understanding, returned demonstration, and verbal cues required  HOME EXERCISE PROGRAM: Access Code: [email or web address] URL: [email or web address] Date: 06/29/2024 Prepared by: Burnard Meth  Exercises - Supine Quad Set  - 2 x daily - 7 x weekly - 2 sets - 10 reps - 3 hold - Supine Active Straight Leg Raise  - 2 x daily - 7 x weekly - 2 sets - 10 reps - Supine Heel Slide  - 2 x daily - 7 x weekly - 2 sets - 10 reps - 3 hold - Mini Squat with Counter Support  - 1 x daily - 7 x weekly - 2 sets - 10 reps - 2 hold  ASSESSMENT:  CLINICAL IMPRESSION: ***Pt with decreased edema at knee jt line to 47 cm. STG completed.  OBJECTIVE IMPAIRMENTS: decreased ROM, decreased strength, increased edema, impaired flexibility, and pain.   ACTIVITY LIMITATIONS: bending, sitting, standing, squatting, sleeping, stairs, and transfers  PARTICIPATION LIMITATIONS: meal prep, cleaning, driving, shopping, and community activity  PERSONAL FACTORS: 1 comorbidity: Lymphedema are also affecting patient's functional outcome.   REHAB POTENTIAL: Good  CLINICAL DECISION MAKING: Stable/uncomplicated  EVALUATION COMPLEXITY: Moderate   GOALS: Goals reviewed with patient? Yes  SHORT TERM GOALS: Target date: 07/20/2024  MET   Patient to be independent with HEP. Baseline:  Goal status: MET 07/12/24  2.  Decreased pain by 1 level. Baseline:  Goal status: MET 07/12/24  3.  Reduce edema by 2 cm. Baseline:  Goal status: MET 08/01/24   LONG TERM GOALS: Target date: 09/07/2024  10 weeks    Patient to be independent with self progressive HEP by time of discharge.              Baseline:  Goal status: INITIAL  2.  Increase active range of motion to 0 degrees extension.           Baseline:  Goal status: INITIAL  3.  Increase active flexion to 105 or more degrees  of flexion. Baseline:  Goal status: INITIAL  4.  Increase strength by 1 full grade of left lower extremity at time of discharge. Baseline:  Goal status: INITIAL  5.  Patient able to ambulate short community distances with no assistive device and minimal to no pain at discharge. Baseline: household Goal status: INITIAL  6.  Increased score of PS FS to greater than 7.33. Baseline: 5.33 Goal status: INITIAL  7. Reduce max pain to 2/10 with all activities. Baseline: 4/10 Goal status: INITIAL  8. Reduce edema by > 5cm in LLE. Baseline: at 49 cm Goal status: INITIAL       PLAN:  PT FREQUENCY: 2x/week-3x week  PT DURATION: 10 weeks  PLANNED INTERVENTIONS: 97164- PT Re-evaluation, 97110-Therapeutic exercises, 97530- Therapeutic activity, 97112- Neuromuscular re-education, 97535- Self Care, 02859- Manual therapy, [phone]- Gait training, [phone]- Vasopneumatic device, Patient/Family education, Stair training, Joint mobilization, and Moist heat  PLAN FOR NEXT SESSION:*** Continue for ROM/strength increases in knee.   Burnard CHRISTELLA Meth, PT 08/03/2024, 10:59 AM

## 2024-08-03 ENCOUNTER — Ambulatory Visit

## 2024-08-03 DIAGNOSIS — R2689 Other abnormalities of gait and mobility: Secondary | ICD-10-CM

## 2024-08-03 DIAGNOSIS — R600 Localized edema: Secondary | ICD-10-CM | POA: Diagnosis not present

## 2024-08-03 DIAGNOSIS — M25662 Stiffness of left knee, not elsewhere classified: Secondary | ICD-10-CM | POA: Diagnosis not present

## 2024-08-03 DIAGNOSIS — M6281 Muscle weakness (generalized): Secondary | ICD-10-CM

## 2024-08-03 DIAGNOSIS — M25562 Pain in left knee: Secondary | ICD-10-CM

## 2024-08-04 ENCOUNTER — Other Ambulatory Visit (HOSPITAL_COMMUNITY)

## 2024-08-07 NOTE — Therapy (Signed)
 OUTPATIENT PHYSICAL THERAPY LOWER EXTREMITY TREATMENT   Patient Name: Melissa Morrow MRN: [id number] DOB:[birth year], 68 y.o., female Today's Date: 08/08/2024  END OF SESSION:  PT End of Session - 08/08/24 0805     Visit Number 11    Number of Visits 21    Date for Recertification  09/07/24    PT Start Time 0715    PT Stop Time 0808    PT Time Calculation (min) 53 min    Activity Tolerance Patient tolerated treatment well    Behavior During Therapy [facility] for tasks assessed/performed                    Past Medical History:  Diagnosis Date   Arthritis    Depression    DVT (deep venous thrombosis) (HCC)    Confirm location with patient.   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   BUNIONECTOMY  1987 and 1996   CESAREAN SECTION  1989 and 1991   DILATION AND CURETTAGE OF UTERUS  1988   GALLBLADDER SURGERY  09/21/2002   laparoscopic   HERNIA REPAIR  04/21/2024   umbilical hernia   Placement of [place] Filter  09/21/1986   TOTAL KNEE ARTHROPLASTY Left 06/16/2024   Procedure: ARTHROPLASTY, KNEE, TOTAL;  Surgeon: Vernetta Lonni GRADE, MD;  Location: [facility];  Service: Orthopedics;  Laterality: Left;   Patient Active Problem List   Diagnosis Date Noted   Status post total left knee replacement 06/16/2024   High blood pressure 03/11/2011   Hearing loss 03/11/2011   Contact lens/glasses fitting 03/11/2011   Menopause 03/11/2011   Family history of breast cancer 03/11/2011    PCP: [name], MD   REFERRING PROVIDER: [name], MD   REFERRING DIAG:  Diagnosis  M17.12 (ICD-10-CM) - Unilateral primary osteoarthritis, left knee  *Left total knee arthroplasty to be done on 06/16/24. Will have HHPT x 2 weeks prior to OPPT. Eval and treat OPPT. Will be ready by  post op visit with Dr. Vernetta on 06/29/24.   THERAPY DIAG:  Acute pain of left knee  Localized edema  Stiffness of left knee, not elsewhere classified  Muscle weakness (generalized)  Other abnormalities of gait and mobility  Rationale for Evaluation and Treatment: Rehabilitation  ONSET DATE: surgery 06/16/24 L TKA  SUBJECTIVE:   SUBJECTIVE STATEMENT: Pt reports she is doing most things at home without difficulty. PERTINENT HISTORY: + hx/o large blood clot in L LE leading to + hx/o swelling/lyphedema PAIN:  Are you having pain? Yes: NPRS scale: 0/10 Pain location: anterior mostly Pain description: sharp Aggravating factors: cpm, bending, walking Relieving factors: rest, medication  PRECAUTIONS: None  RED FLAGS: None   WEIGHT BEARING RESTRICTIONS: No  FALLS:  Has patient fallen in last 6 months? No  LIVING ENVIRONMENT: Lives with: lives with their spouse Lives in: House/apartment Stairs: Yes: External: 4 steps; can reach both Has following equipment at home: Single point cane  OCCUPATION: retired  PLOF: Independent  PATIENT GOALS: decrease pain and increase walking  NEXT MD VISIT: 08/02/24  OBJECTIVE:  Note: Objective measures were completed at Evaluation unless otherwise noted.  DIAGNOSTIC FINDINGS: 03/14/24  An AP and lateral of the left knee shows valgus malalignment with  tricompartment arthritis.  There are osteophytes in all 3 compartments  especially the medial compartment and patellofemoral joint.   PATIENT SURVEYS:  PSFS: THE PATIENT SPECIFIC FUNCTIONAL SCALE  Place score of 0-10 (0 = unable to perform activity and 10 = able to perform activity at the same level as before injury or problem)  Activity Date: 06/29/24    Walking quickly 4    2.bending knee/squatting 5    3.transfers 7    4.      Total Score 5.33      Total Score = Sum of activity scores/number of activities  Minimally Detectable Change: 3 points (for single activity); 2  points (for average score)  [name] (nd). The Patient Specific Functional Scale . Retrieved from Skateo[employer].com.pt   COGNITION: Overall cognitive status: Within functional limits for tasks assessed     SENSATION: WFL  EDEMA:  Circumferential: R leg 38 cm, L leg 49 cm  MUSCLE LENGTH: Hamstrings: Left moderate restriction  POSTURE: No Significant postural limitations  PALPATION: 1+ anterior L knee and jt line   LOWER EXTREMITY ROM:  A/P ROM Right eval Left eval Left 07/18/24 Left 07/20/24  Hip flexion      Hip extension      Hip abduction      Hip adduction      Hip internal rotation      Hip external rotation      Knee flexion  80/ 85 95/100 105 A supine  Knee extension  +11/ +8    Ankle dorsiflexion      Ankle plantarflexion      Ankle inversion      Ankle eversion       (Blank rows = not tested)  LOWER EXTREMITY MMT:  MMT Right eval Left eval Left  08/03/24  Hip flexion  4+   Hip extension     Hip abduction  4   Hip adduction     Hip internal rotation     Hip external rotation     Knee flexion  4- 4  Knee extension  3+ 4-  Ankle dorsiflexion     Ankle plantarflexion     Ankle inversion     Ankle eversion      (Blank rows = not tested)  LOWER EXTREMITY SPECIAL TESTS:  S/P- no special tests  FUNCTIONAL TESTS:  5 times sit to stand: 16 sec  GAIT: Distance walked: household Assistive device utilized: Single point cane Level of assistance: Complete Independence Comments: ordering SPC for home                                                                                                                                TREATMENT DATE: L TKA 08/08/24 Rec bike level 2 8 min Leg Press 112 # 3x15 #62 2x15 9 hurdles step overs 20x Backward step onto 4 then 6step to emphasize extension 25x Standing Tband TKE blue 2x10 Incline board stretch 3x30  LAQ #4 Vaso with L LE  elevation at 36 degrees ands medium compression x10 min   08/03/24 Rec bike level 2 8 min Leg Press 125 # 3x12 #62 2x15 9 hurdles step overs 20x Backward step onto 4 then 6step to emphasize extension 25x Standing Tband TKE green 2x10 Heel raises Incline board stretch 3x30 LAQ #4 Vaso with L LE elevation at 36 degrees ands medium compression x10 min     08/01/24 Rec bike level 2 8 min Leg Press  112 # 3x12 #62 2x12 9 hurdles step overs 20x Backward step onto 4 then 6step to emphasize extension 25x Standing Tband TKE green 2x10 Heel raises Incline board stretch 3x30 LAQ #4 Vaso with L LE elevation at 36 degrees ands medium compression x10 min    07/27/24 Rec bike level 2 8 min Leg Press  106 # 3x12 #56 2x12 9 hurdles step overs 20x Backward step onto 4 step to emphasize extension 25x Walking on airex 4 laps Seated knee flexion with slider 2x10 Standing Tband TKE green 2x10 Heel raises Vaso with L LE elevation at 36 degrees ands medium compression x10 min   07/25/24 Rec bike level 2 8 min Leg Press  100# 3x10 #50 2x10 Seated knee flexion with slider 2x10 Squats 2x10 LAQ 4#  3x10 Standing Tband TKE green 2x10 Lean ins 8 inch step 10x Heel raises Gait training for weight shift and stairs Vaso with L LE elevation at 36 degrees ands medium compression x10 min   07/20/24 Rec bike level 2 8 min Leg Press  87# 3x10 #37 2x10 Seated knee flexion with slider 2x10 Squats 2x10 LAQ 4#  3x10 Standing Tband TKE green 2x10 Lean ins 8 inch step 10x Heel raises Vaso with L LE elevation at 36 degrees ands medium compression x10 min   07/18/24 Rec bike level 1 8 min Leg Press  87# 3x10  Seated knee flexion with slider 2x10 Squats 2x10 LAQ 4#  3x10 Tandem 3x30 sec each foot Standing Tband TKE green 2x10 Lean ins 8 inch step 10x Heel raises  Stair training   Vaso with R LE elevation at 36 degrees ands medium compression x10 min   06/29/24 Initial  evaluation of L knee completed  followed by review, instruction and trial set of HEP.  Self care for edema control and use of CPM machine discussed.  Vaso:  Lt LE elevated on wedge with extra pillows for increased height.  Med compression and 36 degrees for 10 minutes.  PATIENT EDUCATION:  Education details: HEP Person educated: Patient Education method: Explanation, Demonstration, Actor cues, and Verbal cues Education comprehension: verbalized understanding, returned demonstration, and verbal cues required  HOME EXERCISE PROGRAM: Access Code: [email or web address] URL: [email or web address] Date: 06/29/2024 Prepared by: Burnard Meth  Exercises - Supine Quad Set  - 2 x daily - 7 x weekly - 2 sets - 10 reps - 3 hold - Supine Active Straight Leg Raise  - 2 x daily - 7 x weekly - 2 sets - 10 reps - Supine Heel Slide  - 2 x daily - 7 x weekly - 2 sets - 10 reps - 3 hold - Mini Squat with Counter Support  - 1 x daily - 7 x weekly - 2 sets - 10 reps - 2 hold  ASSESSMENT:  CLINICAL IMPRESSION: Pt needed VC for hurdles and some HHA with hurdle step over. OBJECTIVE IMPAIRMENTS: decreased ROM, decreased strength, increased edema, impaired flexibility, and pain.   ACTIVITY LIMITATIONS: bending, sitting, standing,  squatting, sleeping, stairs, and transfers  PARTICIPATION LIMITATIONS: meal prep, cleaning, driving, shopping, and community activity  PERSONAL FACTORS: 1 comorbidity: Lymphedema are also affecting patient's functional outcome.   REHAB POTENTIAL: Good  CLINICAL DECISION MAKING: Stable/uncomplicated  EVALUATION COMPLEXITY: Moderate   GOALS: Goals reviewed with patient? Yes  SHORT TERM GOALS: Target date: 07/20/2024  MET   Patient to be independent with HEP. Baseline: Goal status: MET 07/12/24  2.  Decreased pain by 1 level. Baseline:  Goal status: MET 07/12/24  3.  Reduce edema by 2 cm. Baseline:  Goal status: MET 08/01/24   LONG TERM GOALS: Target date:  09/07/2024  10 weeks    Patient to be independent with self progressive HEP by time of discharge.              Baseline:  Goal status: INITIAL  2.  Increase active range of motion to 0 degrees extension.           Baseline:  Goal status: INITIAL  3.  Increase active flexion to 105 or more degrees of flexion. Baseline:  Goal status: INITIAL  4.  Increase strength by 1 full grade of left lower extremity at time of discharge. Baseline:  Goal status: INITIAL  5.  Patient able to ambulate short community distances with no assistive device and minimal to no pain at discharge. Baseline: household Goal status: INITIAL  6.  Increased score of PS FS to greater than 7.33. Baseline: 5.33 Goal status: INITIAL  7. Reduce max pain to 2/10 with all activities. Baseline: 4/10 Goal status: INITIAL  8. Reduce edema by > 5cm in LLE. Baseline: at 49 cm Goal status: INITIAL       PLAN:  PT FREQUENCY: 2x/week  PT DURATION: 10 weeks  PLANNED INTERVENTIONS: 97164- PT Re-evaluation, 97110-Therapeutic exercises, 97530- Therapeutic activity, 97112- Neuromuscular re-education, 97535- Self Care, 02859- Manual therapy, [phone]- Gait training, [phone]- Vasopneumatic device, Patient/Family education, Stair training, Joint mobilization, and Moist heat  PLAN FOR NEXT SESSION: Continue for ROM/strength increases in knee.   Burnard CHRISTELLA Meth, PT 08/08/2024, 8:06 AM

## 2024-08-08 ENCOUNTER — Ambulatory Visit (INDEPENDENT_AMBULATORY_CARE_PROVIDER_SITE_OTHER)

## 2024-08-08 DIAGNOSIS — M25662 Stiffness of left knee, not elsewhere classified: Secondary | ICD-10-CM | POA: Diagnosis not present

## 2024-08-08 DIAGNOSIS — M6281 Muscle weakness (generalized): Secondary | ICD-10-CM | POA: Diagnosis not present

## 2024-08-08 DIAGNOSIS — R600 Localized edema: Secondary | ICD-10-CM | POA: Diagnosis not present

## 2024-08-08 DIAGNOSIS — M25562 Pain in left knee: Secondary | ICD-10-CM

## 2024-08-08 DIAGNOSIS — R2689 Other abnormalities of gait and mobility: Secondary | ICD-10-CM

## 2024-08-08 NOTE — Therapy (Signed)
 OUTPATIENT PHYSICAL THERAPY LOWER EXTREMITY TREATMENT   Patient Name: Melissa Morrow MRN: [id number] DOB:[birth year], 68 y.o., female Today's Date: 08/09/2024  END OF SESSION:  PT End of Session - 08/09/24 1340     Visit Number 12    Number of Visits 21    Date for Recertification  09/07/24    PT Start Time 1300    PT Stop Time 1348    PT Time Calculation (min) 48 min    Activity Tolerance Patient tolerated treatment well    Behavior During Therapy WFL for tasks assessed/performed                     Past Medical History:  Diagnosis Date   Arthritis    Depression    DVT (deep venous thrombosis) (HCC)    Confirm location with patient.   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   BUNIONECTOMY  1987 and 1996   CESAREAN SECTION  1989 and 1991   DILATION AND CURETTAGE OF UTERUS  1988   GALLBLADDER SURGERY  09/21/2002   laparoscopic   HERNIA REPAIR  04/21/2024   umbilical hernia   Placement of [place] Filter  09/21/1986   TOTAL KNEE ARTHROPLASTY Left 06/16/2024   Procedure: ARTHROPLASTY, KNEE, TOTAL;  Surgeon: Vernetta Lonni GRADE, MD;  Location: [facility];  Service: Orthopedics;  Laterality: Left;   Patient Active Problem List   Diagnosis Date Noted   Status post total left knee replacement 06/16/2024   High blood pressure 03/11/2011   Hearing loss 03/11/2011   Contact lens/glasses fitting 03/11/2011   Menopause 03/11/2011   Family history of breast cancer 03/11/2011    PCP: [name], MD   REFERRING PROVIDER: Vernetta Lonni GRADE*   REFERRING DIAG:  Diagnosis  M17.12 (ICD-10-CM) - Unilateral primary osteoarthritis, left knee  *Left total knee arthroplasty to be done on 06/16/24. Will have HHPT x 2 weeks prior to OPPT. Eval and treat OPPT. Will be ready by  post op visit with Dr. Vernetta on 06/29/24.   THERAPY DIAG:  Acute pain of left knee  Localized edema  Stiffness of left knee, not elsewhere classified  Muscle weakness (generalized)  Other abnormalities of gait and mobility  Rationale for Evaluation and Treatment: Rehabilitation  ONSET DATE: surgery 06/16/24 L TKA  SUBJECTIVE:   SUBJECTIVE STATEMENT: Pt reports feeling good.  Noticed with trainer she's walking on lateral edge of L foot. PERTINENT HISTORY: + hx/o large blood clot in L LE leading to + hx/o swelling/lyphedema PAIN:  Are you having pain? Yes: NPRS scale: 0/10 Pain location: anterior mostly Pain description: sharp Aggravating factors: cpm, bending, walking Relieving factors: rest, medication  PRECAUTIONS: None  RED FLAGS: None   WEIGHT BEARING RESTRICTIONS: No  FALLS:  Has patient fallen in last 6 months? No  LIVING ENVIRONMENT: Lives with: lives with their spouse Lives in: House/apartment Stairs: Yes: External: 4 steps; can reach both Has following equipment at home: Single point cane  OCCUPATION: retired  PLOF: Independent  PATIENT GOALS: decrease pain and increase walking  NEXT MD VISIT: 08/02/24  OBJECTIVE:  Note: Objective measures were completed at Evaluation unless otherwise noted.  DIAGNOSTIC FINDINGS: 03/14/24  An AP and lateral of the left knee shows valgus malalignment  with  tricompartment arthritis.  There are osteophytes in all 3 compartments  especially the medial compartment and patellofemoral joint.   PATIENT SURVEYS:  PSFS: THE PATIENT SPECIFIC FUNCTIONAL SCALE  Place score of 0-10 (0 = unable to perform activity and 10 = able to perform activity at the same level as before injury or problem)  Activity Date: 06/29/24    Walking quickly 4    2.bending knee/squatting 5    3.transfers 7    4.      Total Score 5.33      Total Score = Sum of activity scores/number of activities  Minimally Detectable Change: 3 points  (for single activity); 2 points (for average score)  [name] (nd). The Patient Specific Functional Scale . Retrieved from [employer].com.pt   COGNITION: Overall cognitive status: Within functional limits for tasks assessed     SENSATION: WFL  EDEMA:  Circumferential: R leg 38 cm, L leg 49 cm  MUSCLE LENGTH: Hamstrings: Left moderate restriction  POSTURE: No Significant postural limitations  PALPATION: 1+ anterior L knee and jt line   LOWER EXTREMITY ROM:  A/P ROM Right eval Left eval Left 07/18/24 Left 07/20/24 Left 11//25  Hip flexion       Hip extension       Hip abduction       Hip adduction       Hip internal rotation       Hip external rotation       Knee flexion  80/ 85 95/100 105 A supine   Knee extension  +11/ +8     Ankle dorsiflexion       Ankle plantarflexion       Ankle inversion       Ankle eversion        (Blank rows = not tested)  LOWER EXTREMITY MMT:  MMT Right eval Left eval Left  08/03/24  Hip flexion  4+   Hip extension     Hip abduction  4   Hip adduction     Hip internal rotation     Hip external rotation     Knee flexion  4- 4  Knee extension  3+ 4-  Ankle dorsiflexion     Ankle plantarflexion     Ankle inversion     Ankle eversion      (Blank rows = not tested)  LOWER EXTREMITY SPECIAL TESTS:  S/P- no special tests  FUNCTIONAL TESTS:  5 times sit to stand: 16 sec  GAIT: Distance walked: household Assistive device utilized: Single point cane Level of assistance: Complete Independence Comments: ordering SPC for home                                                                                                                                TREATMENT DATE: L TKA 08/09/24 Rec bike level 2 8 min Leg Press 112 # 3x15 #62 2x15 9 hurdles step overs 20x  Backward step onto 4 then 6step to emphasize extension 25x Standing Tband TKE blue  2x10 Incline board stretch 3x30 LAQ #4 Vaso with L LE elevation at 36 degrees ands medium compression x10 min    08/08/24 Rec bike level 2 8 min Leg Press 112 # 3x15 #62 2x15 9 hurdles step overs 20x Backward step onto 4 then 6step to emphasize extension 25x Standing Tband TKE blue 2x10 Incline board stretch 3x30 LAQ #4 Vaso with L LE elevation at 36 degrees ands medium compression x10 min   08/03/24 Rec bike level 2 8 min Leg Press 125 # 3x12 #62 2x15 9 hurdles step overs 20x Backward step onto 4 then 6step to emphasize extension 25x Standing Tband TKE green 2x10 Heel raises Incline board stretch 3x30 LAQ #4 Vaso with L LE elevation at 36 degrees ands medium compression x10 min     08/01/24 Rec bike level 2 8 min Leg Press  112 # 3x12 #62 2x12 9 hurdles step overs 20x Backward step onto 4 then 6step to emphasize extension 25x Standing Tband TKE green 2x10 Heel raises Incline board stretch 3x30 LAQ #4 Vaso with L LE elevation at 36 degrees ands medium compression x10 min    07/27/24 Rec bike level 2 8 min Leg Press  106 # 3x12 #56 2x12 9 hurdles step overs 20x Backward step onto 4 step to emphasize extension 25x Walking on airex 4 laps Seated knee flexion with slider 2x10 Standing Tband TKE green 2x10 Heel raises Vaso with L LE elevation at 36 degrees ands medium compression x10 min   07/25/24 Rec bike level 2 8 min Leg Press  100# 3x10 #50 2x10 Seated knee flexion with slider 2x10 Squats 2x10 LAQ 4#  3x10 Standing Tband TKE green 2x10 Lean ins 8 inch step 10x Heel raises Gait training for weight shift and stairs Vaso with L LE elevation at 36 degrees ands medium compression x10 min   07/20/24 Rec bike level 2 8 min Leg Press  87# 3x10 #37 2x10 Seated knee flexion with slider 2x10 Squats 2x10 LAQ 4#  3x10 Standing Tband TKE green 2x10 Lean ins 8 inch step 10x Heel raises Vaso with L LE elevation at 36 degrees ands medium  compression x10 min   07/18/24 Rec bike level 1 8 min Leg Press  87# 3x10  Seated knee flexion with slider 2x10 Squats 2x10 LAQ 4#  3x10 Tandem 3x30 sec each foot Standing Tband TKE green 2x10 Lean ins 8 inch step 10x Heel raises  Stair training   Vaso with R LE elevation at 36 degrees ands medium compression x10 min   06/29/24 Initial evaluation of L knee completed  followed by review, instruction and trial set of HEP.  Self care for edema control and use of CPM machine discussed.  Vaso:  Lt LE elevated on wedge with extra pillows for increased height.  Med compression and 36 degrees for 10 minutes.  PATIENT EDUCATION:  Education details: HEP Person educated: Patient Education method: Explanation, Demonstration, Actor cues, and Verbal cues Education comprehension: verbalized understanding, returned demonstration, and verbal cues required  HOME EXERCISE PROGRAM: Access Code: [email or web address] URL: [email or web address] Date: 06/29/2024 Prepared by: Burnard Meth  Exercises - Supine Quad Set  - 2 x daily - 7 x weekly - 2 sets - 10 reps - 3 hold - Supine Active Straight Leg Raise  - 2 x daily - 7 x weekly - 2 sets - 10 reps - Supine Heel Slide  - 2  x daily - 7 x weekly - 2 sets - 10 reps - 3 hold - Mini Squat with Counter Support  - 1 x daily - 7 x weekly - 2 sets - 10 reps - 2 hold  ASSESSMENT:  CLINICAL IMPRESSION: Pt needed VC for pushing into extension on leg press.  Demonstrated understanding. OBJECTIVE IMPAIRMENTS: decreased ROM, decreased strength, increased edema, impaired flexibility, and pain.   ACTIVITY LIMITATIONS: bending, sitting, standing, squatting, sleeping, stairs, and transfers  PARTICIPATION LIMITATIONS: meal prep, cleaning, driving, shopping, and community activity  PERSONAL FACTORS: 1 comorbidity: Lymphedema are also affecting patient's functional outcome.   REHAB POTENTIAL: Good  CLINICAL DECISION MAKING:  Stable/uncomplicated  EVALUATION COMPLEXITY: Moderate   GOALS: Goals reviewed with patient? Yes  SHORT TERM GOALS: Target date: 07/20/2024  MET   Patient to be independent with HEP. Baseline: Goal status: MET 07/12/24  2.  Decreased pain by 1 level. Baseline:  Goal status: MET 07/12/24  3.  Reduce edema by 2 cm. Baseline:  Goal status: MET 08/01/24   LONG TERM GOALS: Target date: 09/07/2024  10 weeks    Patient to be independent with self progressive HEP by time of discharge.              Baseline:  Goal status: INITIAL  2.  Increase active range of motion to 0 degrees extension.           Baseline:  Goal status: INITIAL  3.  Increase active flexion to 105 or more degrees of flexion. Baseline:  Goal status: INITIAL  4.  Increase strength by 1 full grade of left lower extremity at time of discharge. Baseline:  Goal status: INITIAL  5.  Patient able to ambulate short community distances with no assistive device and minimal to no pain at discharge. Baseline: household Goal status: INITIAL  6.  Increased score of PS FS to greater than 7.33. Baseline: 5.33 Goal status: INITIAL  7. Reduce max pain to 2/10 with all activities. Baseline: 4/10 Goal status: INITIAL  8. Reduce edema by > 5cm in LLE. Baseline: at 49 cm Goal status: INITIAL       PLAN:  PT FREQUENCY: 2x/week  PT DURATION: 10 weeks  PLANNED INTERVENTIONS: 97164- PT Re-evaluation, 97110-Therapeutic exercises, 97530- Therapeutic activity, 97112- Neuromuscular re-education, 97535- Self Care, 02859- Manual therapy, [phone]- Gait training, [phone]- Vasopneumatic device, Patient/Family education, Stair training, Joint mobilization, and Moist heat  PLAN FOR NEXT SESSION: Measure next visit.  Continue for ROM/strength increases in kneee.   Burnard CHRISTELLA Meth, PT 08/09/2024, 1:41 PM

## 2024-08-09 ENCOUNTER — Ambulatory Visit (INDEPENDENT_AMBULATORY_CARE_PROVIDER_SITE_OTHER)

## 2024-08-09 DIAGNOSIS — R600 Localized edema: Secondary | ICD-10-CM | POA: Diagnosis not present

## 2024-08-09 DIAGNOSIS — M25562 Pain in left knee: Secondary | ICD-10-CM

## 2024-08-09 DIAGNOSIS — M6281 Muscle weakness (generalized): Secondary | ICD-10-CM

## 2024-08-09 DIAGNOSIS — R2689 Other abnormalities of gait and mobility: Secondary | ICD-10-CM

## 2024-08-09 DIAGNOSIS — M25662 Stiffness of left knee, not elsewhere classified: Secondary | ICD-10-CM

## 2024-08-10 ENCOUNTER — Encounter

## 2024-08-14 ENCOUNTER — Encounter

## 2024-08-16 NOTE — Therapy (Signed)
 OUTPATIENT PHYSICAL THERAPY LOWER EXTREMITY TREATMENT   Patient Name: Melissa Morrow MRN: [id number] DOB:11-Nov-1955, 68 y.o., female Today's Date: 08/21/2024  END OF SESSION:  PT End of Session - 08/21/24 1055     Visit Number 13    Number of Visits 21    Date for Recertification  09/07/24    PT Start Time 1055    PT Stop Time 1143    PT Time Calculation (min) 48 min    Activity Tolerance Patient tolerated treatment well    Behavior During Therapy WFL for tasks assessed/performed            Past Medical History:  Diagnosis Date   Arthritis    Depression    DVT (deep venous thrombosis) (HCC)    Confirm location with patient.   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   BUNIONECTOMY  1987 and 1996   CESAREAN SECTION  1989 and 1991   DILATION AND CURETTAGE OF UTERUS  1988   GALLBLADDER SURGERY  09/21/2002   laparoscopic   HERNIA REPAIR  04/21/2024   umbilical hernia   Placement of Greenfield Filter  09/21/1986   TOTAL KNEE ARTHROPLASTY Left 06/16/2024   Procedure: ARTHROPLASTY, KNEE, TOTAL;  Surgeon: Vernetta Lonni GRADE, MD;  Location: [facility];  Service: Orthopedics;  Laterality: Left;   Patient Active Problem List   Diagnosis Date Noted   Status post total left knee replacement 06/16/2024   High blood pressure 03/11/2011   Hearing loss 03/11/2011   Contact lens/glasses fitting 03/11/2011   Menopause 03/11/2011   Family history of breast cancer 03/11/2011    PCP: Melissa Morrow., MD   REFERRING PROVIDER: Vernetta Lonni GRADE*   REFERRING DIAG:  Diagnosis  M17.12 (ICD-10-CM) - Unilateral primary osteoarthritis, left knee  *Left total knee arthroplasty to be done on 06/16/24. Will have HHPT x 2 weeks prior to OPPT. Eval and treat OPPT. Will be ready by post op visit with  Dr. Vernetta on 06/29/24.   THERAPY DIAG:  Acute pain of left knee  Localized edema  Stiffness of left knee, not elsewhere classified  Muscle weakness (generalized)  Other abnormalities of gait and mobility  Rationale for Evaluation and Treatment: Rehabilitation  ONSET DATE: surgery 06/16/24 L TKA  SUBJECTIVE:   SUBJECTIVE STATEMENT: Pt reports knee feeling good. PERTINENT HISTORY: + hx/o large blood clot in L LE leading to + hx/o swelling/lyphedema PAIN: Are you having pain? Yes: NPRS scale: 0/10 Pain location: anterior mostly Pain description: sharp Aggravating factors: cpm, bending, walking Relieving factors: rest, medication  PRECAUTIONS: None  RED FLAGS: None   WEIGHT BEARING RESTRICTIONS: No  FALLS:  Has patient fallen in last 6 months? No  LIVING ENVIRONMENT: Lives with: lives with their spouse Lives in: House/apartment Stairs: Yes: External: 4 steps; can reach both Has following equipment at home: Single point cane  OCCUPATION: retired  PLOF: Independent  PATIENT GOALS: decrease pain and increase walking  NEXT MD VISIT: 08/02/24  OBJECTIVE:  Note: Objective measures were completed at Evaluation unless otherwise noted.  DIAGNOSTIC FINDINGS: 03/14/24  An AP and lateral of the left knee shows valgus malalignment with  tricompartment arthritis.  There are osteophytes in all 3 compartments  especially the medial compartment and patellofemoral joint.  PATIENT SURVEYS:  PSFS: THE PATIENT SPECIFIC FUNCTIONAL SCALE  Place score of 0-10 (0 = unable to perform activity and 10 = able to perform activity at the same level as before injury or problem)  Activity Date: 06/29/24    Walking quickly 4    2.bending knee/squatting 5    3.transfers 7    4.      Total Score 5.33      Total Score = Sum of activity scores/number of activities  Minimally Detectable Change: 3 points (for single activity); 2 points (for average score)  [name]  (nd). The Patient Specific Functional Scale . Retrieved from [employer]ateo[employer].com.pt   COGNITION: Overall cognitive status: Within functional limits for tasks assessed     SENSATION: WFL  EDEMA:  Circumferential: R leg 38 cm, L leg 49 cm  MUSCLE LENGTH: Hamstrings: Left moderate restriction  POSTURE: No Significant postural limitations  PALPATION: 1+ anterior L knee and jt line   LOWER EXTREMITY ROM:  A/P ROM Right eval Left eval Left 07/18/24 Left 07/20/24 Left 08/21/24  Hip flexion       Hip extension       Hip abduction       Hip adduction       Hip internal rotation       Hip external rotation       Knee flexion  80/ 85 95/100 105 A supine 110 A 114 P  Knee extension  +11/ +8     Ankle dorsiflexion       Ankle plantarflexion       Ankle inversion       Ankle eversion        (Blank rows = not tested)  LOWER EXTREMITY MMT:  MMT Right eval Left eval Left  08/03/24  Hip flexion  4+   Hip extension     Hip abduction  4   Hip adduction     Hip internal rotation     Hip external rotation     Knee flexion  4- 4  Knee extension  3+ 4-  Ankle dorsiflexion     Ankle plantarflexion     Ankle inversion     Ankle eversion      (Blank rows = not tested)  LOWER EXTREMITY SPECIAL TESTS:  S/P- no special tests  FUNCTIONAL TESTS:  5 times sit to stand: 16 sec  GAIT: Distance walked: household Assistive device utilized: Single point cane Level of assistance: Complete Independence Comments: ordering SPC for home                                                                                                                                TREATMENT DATE: L TKA 12/1/25measure Rec bike level 2 8 min Leg Press 112 # 3x15 #62 2x15 9 hurdles step overs 20x Standing Tband TKE blue 2x10 Incline board stretch 3x30 LAQ #4 3x10 SAQ 2.5 3 x10 Vaso with L  LE elevation at 36 degrees ands medium compression x10  min   08/09/24 Rec bike level 2 8 min Leg Press 112 # 3x15 #62 2x15 9 hurdles step overs 20x Backward step onto 4 then 6step to emphasize extension 25x Standing Tband TKE blue 2x10 Incline board stretch 3x30 LAQ #4 Vaso with L LE elevation at 36 degrees ands medium compression x10 min    08/08/24 Rec bike level 2 8 min Leg Press 112 # 3x15 #62 2x15 9 hurdles step overs 20x Backward step onto 4 then 6step to emphasize extension 25x Standing Tband TKE blue 2x10 Incline board stretch 3x30 LAQ #4 Vaso with L LE elevation at 36 degrees ands medium compression x10 min  06/29/24 Initial evaluation of L knee completed  followed by review, instruction and trial set of HEP.  Self care for edema control and use of CPM machine discussed.  Vaso:  Lt LE elevated on wedge with extra pillows for increased height.  Med compression and 36 degrees for 10 minutes.  PATIENT EDUCATION:  Education details: HEP Person educated: Patient Education method: Explanation, Demonstration, Actor cues, and Verbal cues Education comprehension: verbalized understanding, returned demonstration, and verbal cues required  HOME EXERCISE PROGRAM: Access Code: [email or web address] URL: [email or web address] Date: 06/29/2024 Prepared by: Burnard Meth  Exercises - Supine Quad Set  - 2 x daily - 7 x weekly - 2 sets - 10 reps - 3 hold - Supine Active Straight Leg Raise  - 2 x daily - 7 x weekly - 2 sets - 10 reps - Supine Heel Slide  - 2 x daily - 7 x weekly - 2 sets - 10 reps - 3 hold - Mini Squat with Counter Support  - 1 x daily - 7 x weekly - 2 sets - 10 reps - 2 hold  ASSESSMENT:  CLINICAL IMPRESSION: Pt demonstrating increased active and passive flexion. OBJECTIVE IMPAIRMENTS: decreased ROM, decreased strength, increased edema, impaired flexibility, and pain.   ACTIVITY LIMITATIONS: bending, sitting, standing, squatting, sleeping, stairs, and transfers  PARTICIPATION LIMITATIONS: meal prep,  cleaning, driving, shopping, and community activity  PERSONAL FACTORS: 1 comorbidity: Lymphedema are also affecting patient's functional outcome.   REHAB POTENTIAL: Good  CLINICAL DECISION MAKING: Stable/uncomplicated  EVALUATION COMPLEXITY: Moderate   GOALS: Goals reviewed with patient? Yes  SHORT TERM GOALS: Target date: 07/20/2024  MET   Patient to be independent with HEP. Baseline: Goal status: MET 07/12/24  2.  Decreased pain by 1 level. Baseline:  Goal status: MET 07/12/24  3.  Reduce edema by 2 cm. Baseline:  Goal status: MET 08/01/24   LONG TERM GOALS: Target date: 09/07/2024  10 weeks    Patient to be independent with self progressive HEP by time of discharge.              Baseline:  Goal status: INITIAL  2.  Increase active range of motion to 0 degrees extension.           Baseline:  Goal status: INITIAL  3.  Increase active flexion to 105 or more degrees of flexion. Baseline:  Goal status: INITIAL  4.  Increase strength by 1 full grade of left lower extremity at time of discharge. Baseline:  Goal status: INITIAL  5.  Patient able to ambulate short community distances with no assistive device and minimal to no pain at discharge. Baseline: household Goal status: INITIAL  6.  Increased score of PS FS to greater than 7.33. Baseline: 5.33 Goal status: INITIAL  7. Reduce max  pain to 2/10 with all activities. Baseline: 4/10 Goal status: INITIAL  8. Reduce edema by > 5cm in LLE. Baseline: at 49 cm Goal status: INITIAL       PLAN:  PT FREQUENCY: 2x/week  PT DURATION: 10 weeks  PLANNED INTERVENTIONS: 97164- PT Re-evaluation, 97110-Therapeutic exercises, 97530- Therapeutic activity, 97112- Neuromuscular re-education, 97535- Self Care, 02859- Manual therapy, [phone]- Gait training, [phone]- Vasopneumatic device, Patient/Family education, Stair training, Joint mobilization, and Moist heat  PLAN FOR NEXT SESSION:  Continue for ROM/strength  increases in knee.   Burnard CHRISTELLA Meth, PT 08/21/2024, 10:57 AM

## 2024-08-21 ENCOUNTER — Ambulatory Visit

## 2024-08-21 DIAGNOSIS — R600 Localized edema: Secondary | ICD-10-CM

## 2024-08-21 DIAGNOSIS — M25562 Pain in left knee: Secondary | ICD-10-CM

## 2024-08-21 DIAGNOSIS — M25662 Stiffness of left knee, not elsewhere classified: Secondary | ICD-10-CM

## 2024-08-21 DIAGNOSIS — R2689 Other abnormalities of gait and mobility: Secondary | ICD-10-CM

## 2024-08-21 DIAGNOSIS — M6281 Muscle weakness (generalized): Secondary | ICD-10-CM | POA: Diagnosis not present

## 2024-08-22 ENCOUNTER — Other Ambulatory Visit (HOSPITAL_COMMUNITY): Payer: Self-pay

## 2024-08-22 NOTE — Therapy (Signed)
 OUTPATIENT PHYSICAL THERAPY LOWER EXTREMITY TREATMENT   Patient Name: Melissa Morrow MRN: [id number] DOB:1955-12-20, 68 y.o., female Today's Date: 08/23/2024  END OF SESSION:  PT End of Session - 08/23/24 0845     Visit Number 14    Number of Visits 21    Date for Recertification  09/07/24    PT Start Time 0800    PT Stop Time 0850    PT Time Calculation (min) 50 min    Activity Tolerance Patient tolerated treatment well    Behavior During Therapy [facility] for tasks assessed/performed             Past Medical History:  Diagnosis Date   Arthritis    Depression    DVT (deep venous thrombosis) (HCC)    Confirm location with patient.   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   BUNIONECTOMY  1987 and 1996   CESAREAN SECTION  1989 and 1991   DILATION AND CURETTAGE OF UTERUS  1988   GALLBLADDER SURGERY  09/21/2002   laparoscopic   HERNIA REPAIR  04/21/2024   umbilical hernia   Placement of [place] Filter  09/21/1986   TOTAL KNEE ARTHROPLASTY Left 06/16/2024   Procedure: ARTHROPLASTY, KNEE, TOTAL;  Surgeon: Vernetta Lonni GRADE, MD;  Location: [facility];  Service: Orthopedics;  Laterality: Left;   Patient Active Problem List   Diagnosis Date Noted   Status post total left knee replacement 06/16/2024   High blood pressure 03/11/2011   Hearing loss 03/11/2011   Contact lens/glasses fitting 03/11/2011   Menopause 03/11/2011   Family history of breast cancer 03/11/2011    PCP: [name], MD   REFERRING PROVIDER: Vernetta Lonni GRADE*   REFERRING DIAG:  Diagnosis  M17.12 (ICD-10-CM) - Unilateral primary osteoarthritis, left knee  *Left total knee arthroplasty to be done on 06/16/24. Will have HHPT x 2 weeks prior to OPPT. Eval and treat OPPT. Will be ready by post op visit  with Dr. Vernetta on 06/29/24.   THERAPY DIAG:  Acute pain of left knee  Localized edema  Stiffness of left knee, not elsewhere classified  Muscle weakness (generalized)  Other abnormalities of gait and mobility  Rationale for Evaluation and Treatment: Rehabilitation  ONSET DATE: surgery 06/16/24 L TKA  SUBJECTIVE:   SUBJECTIVE STATEMENT: Pt reports she may have a hernia so we have to be careful with the exercises. PERTINENT HISTORY: + hx/o large blood clot in L LE leading to + hx/o swelling/lyphedema PAIN: Are you having pain? Yes: NPRS scale: 0/10 Pain location: anterior mostly Pain description: sharp Aggravating factors: cpm, bending, walking Relieving factors: rest, medication  PRECAUTIONS: None  RED FLAGS: None   WEIGHT BEARING RESTRICTIONS: No  FALLS:  Has patient fallen in last 6 months? No  LIVING ENVIRONMENT: Lives with: lives with their spouse Lives in: House/apartment Stairs: Yes: External: 4 steps; can reach both Has following equipment at home: Single point cane  OCCUPATION: retired  PLOF: Independent  PATIENT GOALS: decrease pain and increase walking  NEXT MD VISIT: 08/02/24  OBJECTIVE:  Note: Objective measures were completed at Evaluation unless otherwise noted.  DIAGNOSTIC FINDINGS: 03/14/24  An AP and lateral of the left knee shows valgus malalignment with  tricompartment arthritis.  There are osteophytes in  all 3 compartments  especially the medial compartment and patellofemoral joint.   PATIENT SURVEYS:  PSFS: THE PATIENT SPECIFIC FUNCTIONAL SCALE  Place score of 0-10 (0 = unable to perform activity and 10 = able to perform activity at the same level as before injury or problem)  Activity Date: 06/29/24    Walking quickly 4    2.bending knee/squatting 5    3.transfers 7    4.      Total Score 5.33      Total Score = Sum of activity scores/number of activities  Minimally Detectable Change: 3 points (for single activity); 2  points (for average score)  [name] (nd). The Patient Specific Functional Scale . Retrieved from [employer].com.pt   COGNITION: Overall cognitive status: Within functional limits for tasks assessed     SENSATION: WFL  EDEMA:  Circumferential: R leg 38 cm, L leg 49 cm  MUSCLE LENGTH: Hamstrings: Left moderate restriction  POSTURE: No Significant postural limitations  PALPATION: 1+ anterior L knee and jt line   LOWER EXTREMITY ROM:  A/P ROM Right eval Left eval Left 07/18/24 Left 07/20/24 Left 08/21/24  Hip flexion       Hip extension       Hip abduction       Hip adduction       Hip internal rotation       Hip external rotation       Knee flexion  80/ 85 95/100 105 A supine 110 A 114 P  Knee extension  +11/ +8     Ankle dorsiflexion       Ankle plantarflexion       Ankle inversion       Ankle eversion        (Blank rows = not tested)  LOWER EXTREMITY MMT:  MMT Right eval Left eval Left  08/03/24  Hip flexion  4+   Hip extension     Hip abduction  4   Hip adduction     Hip internal rotation     Hip external rotation     Knee flexion  4- 4  Knee extension  3+ 4-  Ankle dorsiflexion     Ankle plantarflexion     Ankle inversion     Ankle eversion      (Blank rows = not tested)  LOWER EXTREMITY SPECIAL TESTS:  S/P- no special tests  FUNCTIONAL TESTS:  5 times sit to stand: 16 sec  GAIT: Distance walked: household Assistive device utilized: Single point cane Level of assistance: Complete Independence Comments: ordering SPC for home                                                                                                                                TREATMENT DATE: L TKA 08/23/24 Nu step level 7 6 min Leg Press 87# 3x15 #50 2x15 Standing Tband TKE blue 2x10 Incline board stretch 3x30 LAQ #4 3x10  SAQ #3 3 x10 Seated marching 4# 20x  Seated slides for knee flexion  15x Vaso with L LE elevation at 36 degrees ands medium compression x10 min   12/1/25measure Rec bike level 2 8 min Leg Press 112 # 3x15 #62 2x15 9 hurdles step overs 20x Standing Tband TKE blue 2x10 Incline board stretch 3x30 LAQ #4 3x10 SAQ 2.5 3 x10 Vaso with L LE elevation at 36 degrees ands medium compression x10 min   06/29/24 Initial evaluation of L knee completed  followed by review, instruction and trial set of HEP.  Self care for edema control and use of CPM machine discussed.  Vaso:  Lt LE elevated on wedge with extra pillows for increased height.  Med compression and 36 degrees for 10 minutes.  PATIENT EDUCATION:  Education details: HEP Person educated: Patient Education method: Explanation, Demonstration, Actor cues, and Verbal cues Education comprehension: verbalized understanding, returned demonstration, and verbal cues required  HOME EXERCISE PROGRAM: Access Code: [email or web address] URL: [email or web address] Date: 06/29/2024 Prepared by: Burnard Meth  Exercises - Supine Quad Set  - 2 x daily - 7 x weekly - 2 sets - 10 reps - 3 hold - Supine Active Straight Leg Raise  - 2 x daily - 7 x weekly - 2 sets - 10 reps - Supine Heel Slide  - 2 x daily - 7 x weekly - 2 sets - 10 reps - 3 hold - Mini Squat with Counter Support  - 1 x daily - 7 x weekly - 2 sets - 10 reps - 2 hold  ASSESSMENT:  CLINICAL IMPRESSION: Weights adjusted so as to not irritate hernia.  Pt tolerated light weight without problems in knee or hernia area.   OBJECTIVE IMPAIRMENTS: decreased ROM, decreased strength, increased edema, impaired flexibility, and pain.   ACTIVITY LIMITATIONS: bending, sitting, standing, squatting, sleeping, stairs, and transfers  PARTICIPATION LIMITATIONS: meal prep, cleaning, driving, shopping, and community activity  PERSONAL FACTORS: 1 comorbidity: Lymphedema are also affecting patient's functional outcome.   REHAB POTENTIAL: Good  CLINICAL DECISION  MAKING: Stable/uncomplicated  EVALUATION COMPLEXITY: Moderate   GOALS: Goals reviewed with patient? Yes  SHORT TERM GOALS: Target date: 07/20/2024  MET   Patient to be independent with HEP. Baseline: Goal status: MET 07/12/24  2.  Decreased pain by 1 level. Baseline:  Goal status: MET 07/12/24  3.  Reduce edema by 2 cm. Baseline:  Goal status: MET 08/01/24   LONG TERM GOALS: Target date: 09/07/2024  10 weeks    Patient to be independent with self progressive HEP by time of discharge.              Baseline:  Goal status: INITIAL  2.  Increase active range of motion to 0 degrees extension.           Baseline:  Goal status: INITIAL  3.  Increase active flexion to 105 or more degrees of flexion. Baseline:  Goal status: INITIAL  4.  Increase strength by 1 full grade of left lower extremity at time of discharge. Baseline:  Goal status: INITIAL  5.  Patient able to ambulate short community distances with no assistive device and minimal to no pain at discharge. Baseline: household Goal status: INITIAL  6.  Increased score of PS FS to greater than 7.33. Baseline: 5.33 Goal status: INITIAL  7. Reduce max pain to 2/10 with all activities. Baseline: 4/10 Goal status: INITIAL  8. Reduce edema by > 5cm in LLE. Baseline: at 49 cm Goal status: INITIAL  PLAN:  PT FREQUENCY: 2x/week  PT DURATION: 10 weeks  PLANNED INTERVENTIONS: 97164- PT Re-evaluation, 97110-Therapeutic exercises, 97530- Therapeutic activity, 97112- Neuromuscular re-education, 97535- Self Care, 02859- Manual therapy, [phone]- Gait training, [phone]- Vasopneumatic device, Patient/Family education, Stair training, Joint mobilization, and Moist heat  PLAN FOR NEXT SESSION:  Pt to see MD about hernia this week.  Continue for ROM/strength increases in knee.   Burnard CHRISTELLA Meth, PT 08/23/2024, 8:46 AM

## 2024-08-23 ENCOUNTER — Ambulatory Visit

## 2024-08-23 ENCOUNTER — Other Ambulatory Visit (HOSPITAL_COMMUNITY): Payer: Self-pay

## 2024-08-23 DIAGNOSIS — M6281 Muscle weakness (generalized): Secondary | ICD-10-CM

## 2024-08-23 DIAGNOSIS — R2689 Other abnormalities of gait and mobility: Secondary | ICD-10-CM

## 2024-08-23 DIAGNOSIS — M25562 Pain in left knee: Secondary | ICD-10-CM

## 2024-08-23 DIAGNOSIS — R600 Localized edema: Secondary | ICD-10-CM

## 2024-08-23 DIAGNOSIS — M25662 Stiffness of left knee, not elsewhere classified: Secondary | ICD-10-CM

## 2024-08-23 MED ORDER — PHENTERMINE HCL 8 MG PO TABS
8.0000 mg | ORAL_TABLET | Freq: Two times a day (BID) | ORAL | 1 refills | Status: AC
  Filled 2024-08-23: qty 60, 30d supply, fill #0

## 2024-08-25 ENCOUNTER — Other Ambulatory Visit (HOSPITAL_COMMUNITY): Payer: Self-pay

## 2024-08-25 MED ORDER — PHENTERMINE HCL 8 MG PO TABS
8.0000 mg | ORAL_TABLET | Freq: Two times a day (BID) | ORAL | 1 refills | Status: AC
  Filled 2024-08-25: qty 60, 30d supply, fill #0

## 2024-08-25 NOTE — Therapy (Signed)
 OUTPATIENT PHYSICAL THERAPY LOWER EXTREMITY TREATMENT   Patient Name: Melissa Morrow MRN: [id number] DOB:[birth year], 68 y.o., female Today's Date: 08/28/2024  END OF SESSION:  PT End of Session - 08/28/24 0849     Visit Number 15    Number of Visits 21    Date for Recertification  09/07/24    PT Start Time 0800    PT Stop Time 0840    PT Time Calculation (min) 40 min    Activity Tolerance Patient tolerated treatment well    Behavior During Therapy [facility] for tasks assessed/performed              Past Medical History:  Diagnosis Date   Arthritis    Depression    DVT (deep venous thrombosis) (HCC)    Confirm location with patient.   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   BUNIONECTOMY  1987 and 1996   CESAREAN SECTION  1989 and 1991   DILATION AND CURETTAGE OF UTERUS  1988   GALLBLADDER SURGERY  09/21/2002   laparoscopic   HERNIA REPAIR  04/21/2024   umbilical hernia   Placement of Greenfield Filter  09/21/1986   TOTAL KNEE ARTHROPLASTY Left 06/16/2024   Procedure: ARTHROPLASTY, KNEE, TOTAL;  Surgeon: Melissa Lonni GRADE, MD;  Location: [facility];  Service: Orthopedics;  Laterality: Left;   Patient Active Problem List   Diagnosis Date Noted   Status post total left knee replacement 06/16/2024   High blood pressure 03/11/2011   Hearing loss 03/11/2011   Contact lens/glasses fitting 03/11/2011   Menopause 03/11/2011   Family history of breast cancer 03/11/2011    PCP: Melissa Morrow., MD   REFERRING PROVIDER: Vernetta Lonni Morrow*   REFERRING DIAG:  Diagnosis  M17.12 (ICD-10-CM) - Unilateral primary osteoarthritis, left knee  *Left total knee arthroplasty to be done on 06/16/24. Will have HHPT x 2 weeks prior to OPPT. Eval and treat OPPT. Will be ready by post op visit  with Dr. Vernetta on 06/29/24.   THERAPY DIAG:  Acute pain of left knee  Localized edema  Stiffness of left knee, not elsewhere classified  Muscle weakness (generalized)  Other abnormalities of gait and mobility  Rationale for Evaluation and Treatment: Rehabilitation  ONSET DATE: surgery 06/16/24 L TKA  SUBJECTIVE:   SUBJECTIVE STATEMENT: Pt states MD check up is Wednesday about the hernia status.   PERTINENT HISTORY: + hx/o large blood clot in L LE leading to + hx/o swelling/lyphedema PAIN: Are you having pain? Yes: NPRS scale: 0/10 Pain location: anterior mostly Pain description: sharp Aggravating factors: cpm, bending, walking Relieving factors: rest, medication  PRECAUTIONS: None  RED FLAGS: None   WEIGHT BEARING RESTRICTIONS: No  FALLS:  Has patient fallen in last 6 months? No  LIVING ENVIRONMENT: Lives with: lives with their spouse Lives in: House/apartment Stairs: Yes: External: 4 steps; can reach both Has following equipment at home: Single point cane  OCCUPATION: retired  PLOF: Independent  PATIENT GOALS: decrease pain and increase walking  NEXT MD VISIT: 08/02/24  OBJECTIVE:  Note: Objective measures were completed at Evaluation unless otherwise noted.  DIAGNOSTIC FINDINGS: 03/14/24  An AP and lateral of the left knee shows valgus malalignment with  tricompartment arthritis.  There are osteophytes in all 3  compartments  especially the medial compartment and patellofemoral joint.   PATIENT SURVEYS:  PSFS: THE PATIENT SPECIFIC FUNCTIONAL SCALE  Place score of 0-10 (0 = unable to perform activity and 10 = able to perform activity at the same level as before injury or problem)  Activity Date: 06/29/24 Date: 08/28/24   Walking quickly 4 10   2.bending knee/squatting 5 9   3.transfers 7 9   4.      Total Score 5.33 9.33     Total Score = Sum of activity scores/number of activities  Minimally Detectable Change: 3 points (for single activity);  2 points (for average score)  [facility] (nd). The Patient Specific Functional Scale . Retrieved from [email or web address]   COGNITION: Overall cognitive status: Within functional limits for tasks assessed     SENSATION: WFL  EDEMA:  Circumferential: R leg 38 cm, L leg 49 cm  MUSCLE LENGTH: Hamstrings: Left moderate restriction  POSTURE: No Significant postural limitations  PALPATION: 1+ anterior L knee and jt line   LOWER EXTREMITY ROM:  A/P ROM Right eval Left eval Left 07/18/24 Left 07/20/24 Left 08/21/24  Hip flexion       Hip extension       Hip abduction       Hip adduction       Hip internal rotation       Hip external rotation       Knee flexion  80/ 85 95/100 105 A supine 110 A 114 P  Knee extension  +11/ +8     Ankle dorsiflexion       Ankle plantarflexion       Ankle inversion       Ankle eversion        (Blank rows = not tested)  LOWER EXTREMITY MMT:  MMT Right eval Left eval Left  08/03/24 Left  08/28/24  Hip flexion  4+  5-  Hip extension      Hip abduction  4  4+  Hip adduction      Hip internal rotation      Hip external rotation      Knee flexion  4- 4 4+  Knee extension  3+ 4- 4  Ankle dorsiflexion      Ankle plantarflexion      Ankle inversion      Ankle eversion       (Blank rows = not tested)  LOWER EXTREMITY SPECIAL TESTS:  S/P- no special tests  FUNCTIONAL TESTS:  5 times sit to stand: 16 sec  GAIT: Distance walked: household Assistive device utilized: Single point cane Level of assistance: Complete Independence Comments: ordering SPC for home                                                                                                                                TREATMENT DATE: L TKA 08/28/24 Nu step level 7 6 min Leg Press 87# 3x15 #  50 2x15 Incline board stretch 3x30 Standing Hip flexion/abduction 2x12 LAQ #4 3x10 Seated marching 4# 20x   Seated slides for knee flexion 15x Vaso with L LE elevation at 36 degrees ands medium compression x10 min   08/23/24 Nu step level 7 6 min Leg Press 87# 3x15 #50 2x15 Standing Tband TKE blue 2x10 Incline board stretch 3x30 LAQ #4 3x10 SAQ #3 3 x10 Seated marching 4# 20x  Seated slides for knee flexion 15x Vaso with L LE elevation at 36 degrees ands medium compression x10 min   12/1/25measure Rec bike level 2 8 min Leg Press 112 # 3x15 #62 2x15 9 hurdles step overs 20x Standing Tband TKE blue 2x10 Incline board stretch 3x30 LAQ #4 3x10 SAQ 2.5 3 x10 Vaso with L LE elevation at 36 degrees ands medium compression x10 min   06/29/24 Initial evaluation of L knee completed  followed by review, instruction and trial set of HEP.  Self care for edema control and use of CPM machine discussed.  Vaso:  Lt LE elevated on wedge with extra pillows for increased height.  Med compression and 36 degrees for 10 minutes.  PATIENT EDUCATION:  Education details: HEP Person educated: Patient Education method: Explanation, Demonstration, Actor cues, and Verbal cues Education comprehension: verbalized understanding, returned demonstration, and verbal cues required  HOME EXERCISE PROGRAM: Access Code: [email or web address] URL: [email or web address] Date: 06/29/2024 Prepared by: Burnard Meth  Exercises - Supine Quad Set  - 2 x daily - 7 x weekly - 2 sets - 10 reps - 3 hold - Supine Active Straight Leg Raise  - 2 x daily - 7 x weekly - 2 sets - 10 reps - Supine Heel Slide  - 2 x daily - 7 x weekly - 2 sets - 10 reps - 3 hold - Mini Squat with Counter Support  - 1 x daily - 7 x weekly - 2 sets - 10 reps - 2 hold  ASSESSMENT:  CLINICAL IMPRESSION: Exercises adjusted so as to not irritate hernia.  Pt tolerated light weight without problems in knee or hernia area.  Progressing toward final goals.  OBJECTIVE IMPAIRMENTS: decreased ROM, decreased strength, increased edema, impaired  flexibility, and pain.   ACTIVITY LIMITATIONS: bending, sitting, standing, squatting, sleeping, stairs, and transfers  PARTICIPATION LIMITATIONS: meal prep, cleaning, driving, shopping, and community activity  PERSONAL FACTORS: 1 comorbidity: Lymphedema are also affecting patient's functional outcome.   REHAB POTENTIAL: Good  CLINICAL DECISION MAKING: Stable/uncomplicated  EVALUATION COMPLEXITY: Moderate   GOALS: Goals reviewed with patient? Yes  SHORT TERM GOALS: Target date: 07/20/2024  MET   Patient to be independent with HEP. Baseline: Goal status: MET 07/12/24  2.  Decreased pain by 1 level. Baseline:  Goal status: MET 07/12/24  3.  Reduce edema by 2 cm. Baseline:  Goal status: MET 08/01/24   LONG TERM GOALS: Target date: 09/07/2024  10 weeks    Patient to be independent with self progressive HEP by time of discharge.              Baseline:  Goal status: INITIAL  2.  Increase active range of motion to 0 degrees extension.           Baseline:  Goal status: INITIAL  3.  Increase active flexion to 105 or more degrees of flexion. Baseline:  Goal status: MET 08/28/24  4.  Increase strength by 1 full Morrow of left lower extremity at time of discharge. Baseline:  Goal status: INITIAL  5.  Patient able  to ambulate short community distances with no assistive device and minimal to no pain at discharge. Baseline: household Goal status: INITIAL  6.  Increased score of PS FS to greater than 7.33. Baseline: 5.33 Goal status: MET 08/28/24 7. Reduce max pain to 2/10 with all activities. Baseline: 4/10 Goal status: MET 08/28/24  8. Reduce edema by > 5cm in LLE. Baseline: at 49 cm Goal status: INITIAL       PLAN:  PT FREQUENCY: 2x/week  PT DURATION: 10 weeks  PLANNED INTERVENTIONS: 97164- PT Re-evaluation, 97110-Therapeutic exercises, 97530- Therapeutic activity, 97112- Neuromuscular re-education, 97535- Self Care, 02859- Manual therapy, [phone]- Gait  training, [phone]- Vasopneumatic device, Patient/Family education, Stair training, Joint mobilization, and Moist heat  PLAN FOR NEXT SESSION:  Pt to see MD about hernia this week.  Continue for ROM/strength increases in knee.   Burnard CHRISTELLA Meth, PT 08/28/2024, 8:50 AM

## 2024-08-28 ENCOUNTER — Other Ambulatory Visit (HOSPITAL_COMMUNITY): Payer: Self-pay

## 2024-08-28 ENCOUNTER — Ambulatory Visit (INDEPENDENT_AMBULATORY_CARE_PROVIDER_SITE_OTHER)

## 2024-08-28 ENCOUNTER — Telehealth: Payer: Self-pay

## 2024-08-28 ENCOUNTER — Other Ambulatory Visit: Payer: Self-pay

## 2024-08-28 DIAGNOSIS — M25562 Pain in left knee: Secondary | ICD-10-CM

## 2024-08-28 DIAGNOSIS — M25662 Stiffness of left knee, not elsewhere classified: Secondary | ICD-10-CM

## 2024-08-28 DIAGNOSIS — R600 Localized edema: Secondary | ICD-10-CM | POA: Diagnosis not present

## 2024-08-28 DIAGNOSIS — M6281 Muscle weakness (generalized): Secondary | ICD-10-CM | POA: Diagnosis not present

## 2024-08-28 DIAGNOSIS — R2689 Other abnormalities of gait and mobility: Secondary | ICD-10-CM

## 2024-08-28 MED ORDER — ERGOCALCIFEROL 1.25 MG (50000 UT) PO CAPS
50000.0000 [IU] | ORAL_CAPSULE | ORAL | 2 refills | Status: AC
  Filled 2024-08-28: qty 12, 84d supply, fill #0

## 2024-08-28 NOTE — Telephone Encounter (Signed)
 Faxed to provided number

## 2024-08-28 NOTE — Telephone Encounter (Signed)
 Traci with dental office would like for pre medication note/letter faxed to [phone] attn:Traci.  Please advise.  Thank you.

## 2024-08-29 ENCOUNTER — Other Ambulatory Visit (HOSPITAL_COMMUNITY): Payer: Self-pay

## 2024-08-29 MED ORDER — FOLIC ACID 1 MG PO TABS
1.0000 mg | ORAL_TABLET | Freq: Every day | ORAL | 3 refills | Status: AC
  Filled 2024-08-29: qty 90, 90d supply, fill #0

## 2024-08-29 MED ORDER — ESTRADIOL 0.01 % VA CREA
1.0000 g | TOPICAL_CREAM | VAGINAL | 1 refills | Status: AC
  Filled 2024-08-29: qty 42.5, 90d supply, fill #0

## 2024-08-30 ENCOUNTER — Other Ambulatory Visit (HOSPITAL_COMMUNITY): Payer: Self-pay

## 2024-08-31 ENCOUNTER — Encounter

## 2024-09-01 ENCOUNTER — Other Ambulatory Visit (HOSPITAL_COMMUNITY): Payer: Self-pay

## 2024-09-03 NOTE — Therapy (Signed)
 OUTPATIENT PHYSICAL THERAPY LOWER EXTREMITY TREATMENT   Patient Name: Melissa Morrow MRN: [id number] DOB:[birth year], 68 y.o., female Today's Date: 09/05/2024  END OF SESSION:  PT End of Session - 09/05/24 0751     Visit Number 16    Number of Visits 21    Date for Recertification  09/07/24    PT Start Time 0715    PT Stop Time 0803    PT Time Calculation (min) 48 min    Activity Tolerance Patient tolerated treatment well    Behavior During Therapy [facility] for tasks assessed/performed               Past Medical History:  Diagnosis Date   Arthritis    Depression    DVT (deep venous thrombosis) (HCC)    Confirm location with patient.   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   BUNIONECTOMY  1987 and 1996   CESAREAN SECTION  1989 and 1991   DILATION AND CURETTAGE OF UTERUS  1988   GALLBLADDER SURGERY  09/21/2002   laparoscopic   HERNIA REPAIR  04/21/2024   umbilical hernia   Placement of [place]field Filter  09/21/1986   TOTAL KNEE ARTHROPLASTY Left 06/16/2024   Procedure: ARTHROPLASTY, KNEE, TOTAL;  Surgeon: Vernetta Lonni GRADE, MD;  Location: [facility];  Service: Orthopedics;  Laterality: Left;   Patient Active Problem List   Diagnosis Date Noted   Status post total left knee replacement 06/16/2024   High blood pressure 03/11/2011   Hearing loss 03/11/2011   Contact lens/glasses fitting 03/11/2011   Menopause 03/11/2011   Family history of breast cancer 03/11/2011    PCP: [name], MD   REFERRING PROVIDER: [name], MD   REFERRING DIAG:  Diagnosis  M17.12 (ICD-10-CM) - Unilateral primary osteoarthritis, left knee  *Left total knee arthroplasty to be done on 06/16/24. Will have HHPT x 2 weeks prior to OPPT. Eval and treat OPPT. Will be ready by post op visit  with Dr. Vernetta on 06/29/24.   THERAPY DIAG:  Acute pain of left knee  Localized edema  Stiffness of left knee, not elsewhere classified  Muscle weakness (generalized)  Other abnormalities of gait and mobility  Rationale for Evaluation and Treatment: Rehabilitation  ONSET DATE: surgery 06/16/24 L TKA  SUBJECTIVE:   SUBJECTIVE STATEMENT: Pt states MD said she does not have a hernia and can do any exercises as tolerated.  Pt reports some increased swelling that is causing pain.  Feels it is due to compression stocking not working as well as old one.   PERTINENT HISTORY: + hx/o large blood clot in L LE leading to + hx/o swelling/lyphedema PAIN: Are you having pain? Yes: NPRS scale: 6/10 max Pain location: anterior mostly Pain description: sharp Aggravating factors: cpm, bending, walking Relieving factors: rest, medication  PRECAUTIONS: None  RED FLAGS: None   WEIGHT BEARING RESTRICTIONS: No  FALLS:  Has patient fallen in last 6 months? No  LIVING ENVIRONMENT: Lives with: lives with their spouse Lives in: House/apartment Stairs: Yes: External: 4 steps; can reach both Has following equipment at home: Single point cane  OCCUPATION: retired  PLOF: Independent  PATIENT GOALS: decrease pain and increase walking  NEXT MD VISIT: 08/02/24  OBJECTIVE:  Note: Objective  measures were completed at Evaluation unless otherwise noted.  DIAGNOSTIC FINDINGS: 03/14/24  An AP and lateral of the left knee shows valgus malalignment with  tricompartment arthritis.  There are osteophytes in all 3 compartments  especially the medial compartment and patellofemoral joint.   PATIENT SURVEYS:  PSFS: THE PATIENT SPECIFIC FUNCTIONAL SCALE  Place score of 0-10 (0 = unable to perform activity and 10 = able to perform activity at the same level as before injury or problem)  Activity Date: 06/29/24 Date: 08/28/24   Walking quickly 4 10   2.bending knee/squatting 5 9   3.transfers 7 9    4.      Total Score 5.33 9.33     Total Score = Sum of activity scores/number of activities  Minimally Detectable Change: 3 points (for single activity); 2 points (for average score)  [name] (nd). The Patient Specific Functional Scale . Retrieved from [email or web address]   COGNITION: Overall cognitive status: Within functional limits for tasks assessed     SENSATION: WFL  EDEMA:  Circumferential: R leg 38 cm, L leg 49 cm  MUSCLE LENGTH: Hamstrings: Left moderate restriction  POSTURE: No Significant postural limitations  PALPATION: 1+ anterior L knee and jt line   LOWER EXTREMITY ROM:  A/P ROM Right eval Left eval Left 07/18/24 Left 07/20/24 Left 08/21/24  Hip flexion       Hip extension       Hip abduction       Hip adduction       Hip internal rotation       Hip external rotation       Knee flexion  80/ 85 95/100 105 A supine 110 A 114 P  Knee extension  +11/ +8     Ankle dorsiflexion       Ankle plantarflexion       Ankle inversion       Ankle eversion        (Blank rows = not tested)  LOWER EXTREMITY MMT:  MMT Right eval Left eval Left  08/03/24 Left  08/28/24  Hip flexion  4+  5-  Hip extension      Hip abduction  4  4+  Hip adduction      Hip internal rotation      Hip external rotation      Knee flexion  4- 4 4+  Knee extension  3+ 4- 4  Ankle dorsiflexion      Ankle plantarflexion      Ankle inversion      Ankle eversion       (Blank rows = not tested)  LOWER EXTREMITY SPECIAL TESTS:  S/P- no special tests  FUNCTIONAL TESTS:  5 times sit to stand: 16 sec  GAIT: Distance walked: household Assistive device utilized: Single point cane Level of assistance: Complete Independence Comments: ordering SPC for home  TREATMENT DATE: L  TKA 09/05/24 Rec bike level 2 8 min Leg Press 100# 3x15 #50 2x15 Incline board stretch 3x30 LAQ #4 3x10 SLR #3 3x10 Bridges 2x10 Seated slides for knee flexion 15x  Vaso with L LE elevation at 34 degrees ands medium compression x10 min    08/28/24 Nu step level 7 6 min Leg Press 87# 3x15 #50 2x15 Incline board stretch 3x30 Standing Hip flexion/abduction 2x12 LAQ #4 3x10 Seated marching 4# 20x  Seated slides for knee flexion 15x Vaso with L LE elevation at 36 degrees ands medium compression x10 min   08/23/24 Nu step level 7 6 min Leg Press 87# 3x15 #50 2x15 Standing Tband TKE blue 2x10 Incline board stretch 3x30 LAQ #4 3x10 SAQ #3 3 x10 Seated marching 4# 20x  Seated slides for knee flexion 15x Vaso with L LE elevation at 36 degrees ands medium compression x10 min   12/1/25measure Rec bike level 2 8 min Leg Press 112 # 3x15 #62 2x15 9 hurdles step overs 20x Standing Tband TKE blue 2x10 Incline board stretch 3x30 LAQ #4 3x10 SAQ 2.5 3 x10 Vaso with L LE elevation at 36 degrees ands medium compression x10 min   06/29/24 Initial evaluation of L knee completed  followed by review, instruction and trial set of HEP.  Self care for edema control and use of CPM machine discussed.  Vaso:  Lt LE elevated on wedge with extra pillows for increased height.  Med compression and 36 degrees for 10 minutes.  PATIENT EDUCATION:  Education details: HEP Person educated: Patient Education method: Explanation, Demonstration, Actor cues, and Verbal cues Education comprehension: verbalized understanding, returned demonstration, and verbal cues required  HOME EXERCISE PROGRAM: Access Code: [email or web address] URL: [email or web address] Date: 06/29/2024 Prepared by: Burnard Meth  Exercises - Supine Quad Set  - 2 x daily - 7 x weekly - 2 sets - 10 reps - 3 hold - Supine Active Straight Leg Raise  - 2 x daily - 7 x weekly - 2 sets - 10 reps - Supine Heel Slide  - 2 x daily -  7 x weekly - 2 sets - 10 reps - 3 hold - Mini Squat with Counter Support  - 1 x daily - 7 x weekly - 2 sets - 10 reps - 2 hold  ASSESSMENT:  CLINICAL IMPRESSION: Pt tolerated increased exercises without pain.  OBJECTIVE IMPAIRMENTS: decreased ROM, decreased strength, increased edema, impaired flexibility, and pain.   ACTIVITY LIMITATIONS: bending, sitting, standing, squatting, sleeping, stairs, and transfers  PARTICIPATION LIMITATIONS: meal prep, cleaning, driving, shopping, and community activity  PERSONAL FACTORS: 1 comorbidity: Lymphedema are also affecting patient's functional outcome.   REHAB POTENTIAL: Good  CLINICAL DECISION MAKING: Stable/uncomplicated  EVALUATION COMPLEXITY: Moderate   GOALS: Goals reviewed with patient? Yes  SHORT TERM GOALS: Target date: 07/20/2024  MET   Patient to be independent with HEP. Baseline: Goal status: MET 07/12/24  2.  Decreased pain by 1 level. Baseline:  Goal status: MET 07/12/24  3.  Reduce edema by 2 cm. Baseline:  Goal status: MET 08/01/24   LONG TERM GOALS: Target date: 09/07/2024  10 weeks    Patient to be independent with self progressive HEP by time of discharge.              Baseline:  Goal status: INITIAL  2.  Increase active range of motion to 0 degrees extension.           Baseline:  Goal status: INITIAL  3.  Increase active flexion to 105 or more degrees of flexion. Baseline:  Goal status: MET 08/28/24  4.  Increase strength by 1 full grade of left lower extremity at time of discharge. Baseline:  Goal status: INITIAL  5.  Patient able to ambulate short community distances with no assistive device and minimal to no pain at discharge. Baseline: household Goal status: INITIAL  6.  Increased score of PS FS to greater than 7.33. Baseline: 5.33 Goal status: MET 08/28/24 7. Reduce max pain to 2/10 with all activities. Baseline: 4/10 Goal status: MET 08/28/24  8. Reduce edema by > 5cm in LLE. Baseline:  at 49 cm Goal status: INITIAL       PLAN:  PT FREQUENCY: 2x/week  PT DURATION: 10 weeks  PLANNED INTERVENTIONS: 97164- PT Re-evaluation, 97110-Therapeutic exercises, 97530- Therapeutic activity, 97112- Neuromuscular re-education, 97535- Self Care, 02859- Manual therapy, [phone]- Gait training, [phone]- Vasopneumatic device, Patient/Family education, Stair training, Joint mobilization, and Moist heat  PLAN FOR NEXT SESSION:  Continue for ROM/strength increases in knee.   Burnard CHRISTELLA Meth, PT 09/05/2024, 8:04 AM

## 2024-09-05 ENCOUNTER — Ambulatory Visit

## 2024-09-05 DIAGNOSIS — M6281 Muscle weakness (generalized): Secondary | ICD-10-CM

## 2024-09-05 DIAGNOSIS — M25662 Stiffness of left knee, not elsewhere classified: Secondary | ICD-10-CM

## 2024-09-05 DIAGNOSIS — M25562 Pain in left knee: Secondary | ICD-10-CM

## 2024-09-05 DIAGNOSIS — R600 Localized edema: Secondary | ICD-10-CM

## 2024-09-05 DIAGNOSIS — R2689 Other abnormalities of gait and mobility: Secondary | ICD-10-CM

## 2024-09-05 NOTE — Therapy (Signed)
 OUTPATIENT PHYSICAL THERAPY LOWER EXTREMITY TREATMENT/PROGRESS NOTE/RECERTIFICATION***   Patient Name: Melissa Morrow MRN: [id number] DOB:June 02, 1956, 68 y.o., female Today's Date: 09/05/2024  END OF SESSION:***RECERT         Past Medical History:  Diagnosis Date   Arthritis    Depression    DVT (deep venous thrombosis) (HCC)    Confirm location with patient.   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   BUNIONECTOMY  1987 and 1996   CESAREAN SECTION  1989 and 1991   DILATION AND CURETTAGE OF UTERUS  1988   GALLBLADDER SURGERY  09/21/2002   laparoscopic   HERNIA REPAIR  04/21/2024   umbilical hernia   Placement of [place]field Filter  09/21/1986   TOTAL KNEE ARTHROPLASTY Left 06/16/2024   Procedure: ARTHROPLASTY, KNEE, TOTAL;  Surgeon: Vernetta Lonni GRADE, MD;  Location: [facility];  Service: Orthopedics;  Laterality: Left;   Patient Active Problem List   Diagnosis Date Noted   Status post total left knee replacement 06/16/2024   High blood pressure 03/11/2011   Hearing loss 03/11/2011   Contact lens/glasses fitting 03/11/2011   Menopause 03/11/2011   Family history of breast cancer 03/11/2011    PCP: [name], MD   REFERRING PROVIDER: [name], MD   REFERRING DIAG:  Diagnosis  M17.12 (ICD-10-CM) - Unilateral primary osteoarthritis, left knee  *Left total knee arthroplasty to be done on 06/16/24. Will have HHPT x 2 weeks prior to OPPT. Eval and treat OPPT. Will be ready by post op visit with Dr. Vernetta on 06/29/24.   THERAPY DIAG:  No diagnosis found.  Rationale for Evaluation and Treatment: Rehabilitation  ONSET DATE: surgery 06/16/24 L TKA  SUBJECTIVE:   SUBJECTIVE STATEMENT: Pt states MD said she does not have a hernia and can do any exercises as  tolerated.  Pt reports some increased swelling that is causing pain.  Feels it is due to compression stocking not working as well as old one.   PERTINENT HISTORY: + hx/o large blood clot in L LE leading to + hx/o swelling/lyphedema PAIN: Are you having pain? Yes: NPRS scale: 6/10 max Pain location: anterior mostly Pain description: sharp Aggravating factors: cpm, bending, walking Relieving factors: rest, medication  PRECAUTIONS: None  RED FLAGS: None   WEIGHT BEARING RESTRICTIONS: No  FALLS:  Has patient fallen in last 6 months? No  LIVING ENVIRONMENT: Lives with: lives with their spouse Lives in: House/apartment Stairs: Yes: External: 4 steps; can reach both Has following equipment at home: Single point cane  OCCUPATION: retired  PLOF: Independent  PATIENT GOALS: decrease pain and increase walking  NEXT MD VISIT: 08/02/24  OBJECTIVE:  Note: Objective measures were completed at Evaluation unless otherwise noted.  DIAGNOSTIC FINDINGS: 03/14/24  An AP and lateral of the left knee shows valgus malalignment with  tricompartment arthritis.  There are osteophytes in all 3 compartments  especially the medial compartment and patellofemoral joint.   PATIENT SURVEYS:  PSFS: THE PATIENT SPECIFIC FUNCTIONAL SCALE  Place score of 0-10 (0 = unable to perform activity and 10 = able to perform activity at the same level as before injury or problem)  Activity Date: 06/29/24 Date: 08/28/24   Walking quickly 4 10   2.bending knee/squatting 5 9  3.transfers 7 9   4.      Total Score 5.33 9.33     Total Score = Sum of activity scores/number of activities  Minimally Detectable Change: 3 points (for single activity); 2 points (for average score)  [name] (nd). The Patient Specific Functional Scale . Retrieved from Skateo[employer].com.pt   COGNITION: Overall cognitive status: Within functional limits for tasks  assessed     SENSATION: WFL  EDEMA:  Circumferential: R leg 38 cm, L leg 49 cm  MUSCLE LENGTH: Hamstrings: Left moderate restriction  POSTURE: No Significant postural limitations  PALPATION: 1+ anterior L knee and jt line   LOWER EXTREMITY ROM:  A/P ROM Right eval Left eval Left 07/18/24 Left 07/20/24 Left 08/21/24  Hip flexion       Hip extension       Hip abduction       Hip adduction       Hip internal rotation       Hip external rotation       Knee flexion  80/ 85 95/100 105 A supine 110 A 114 P  Knee extension  +11/ +8     Ankle dorsiflexion       Ankle plantarflexion       Ankle inversion       Ankle eversion        (Blank rows = not tested)  LOWER EXTREMITY MMT:  MMT Right eval Left eval Left  08/03/24 Left  08/28/24  Hip flexion  4+  5-  Hip extension      Hip abduction  4  4+  Hip adduction      Hip internal rotation      Hip external rotation      Knee flexion  4- 4 4+  Knee extension  3+ 4- 4  Ankle dorsiflexion      Ankle plantarflexion      Ankle inversion      Ankle eversion       (Blank rows = not tested)  LOWER EXTREMITY SPECIAL TESTS:  S/P- no special tests  FUNCTIONAL TESTS:  5 times sit to stand: 16 sec  GAIT: Distance walked: household Assistive device utilized: Single point cane Level of assistance: Complete Independence Comments: ordering SPC for home                                                                                                                                TREATMENT DATE: L TKA 09/05/24 Rec bike level 2 8 min Leg Press 100# 3x15 #50 2x15 Incline board stretch 3x30 LAQ #4 3x10 SLR #3 3x10 Bridges 2x10 Seated slides for knee flexion 15x  Vaso with L LE elevation at 34 degrees ands medium compression x10 min    08/28/24 Nu step level 7 6 min Leg Press 87# 3x15 #50 2x15 Incline board stretch 3x30 Standing Hip flexion/abduction 2x12 LAQ #4 3x10 Seated marching 4# 20x  Seated  slides for knee  flexion 15x Vaso with L LE elevation at 36 degrees ands medium compression x10 min   08/23/24 Nu step level 7 6 min Leg Press 87# 3x15 #50 2x15 Standing Tband TKE blue 2x10 Incline board stretch 3x30 LAQ #4 3x10 SAQ #3 3 x10 Seated marching 4# 20x  Seated slides for knee flexion 15x Vaso with L LE elevation at 36 degrees ands medium compression x10 min   12/1/25measure Rec bike level 2 8 min Leg Press 112 # 3x15 #62 2x15 9 hurdles step overs 20x Standing Tband TKE blue 2x10 Incline board stretch 3x30 LAQ #4 3x10 SAQ 2.5 3 x10 Vaso with L LE elevation at 36 degrees ands medium compression x10 min   06/29/24 Initial evaluation of L knee completed  followed by review, instruction and trial set of HEP.  Self care for edema control and use of CPM machine discussed.  Vaso:  Lt LE elevated on wedge with extra pillows for increased height.  Med compression and 36 degrees for 10 minutes.  PATIENT EDUCATION:  Education details: HEP Person educated: Patient Education method: Explanation, Demonstration, Actor cues, and Verbal cues Education comprehension: verbalized understanding, returned demonstration, and verbal cues required  HOME EXERCISE PROGRAM: Access Code: [email or web address] URL: [email or web address] Date: 06/29/2024 Prepared by: Burnard Meth  Exercises - Supine Quad Set  - 2 x daily - 7 x weekly - 2 sets - 10 reps - 3 hold - Supine Active Straight Leg Raise  - 2 x daily - 7 x weekly - 2 sets - 10 reps - Supine Heel Slide  - 2 x daily - 7 x weekly - 2 sets - 10 reps - 3 hold - Mini Squat with Counter Support  - 1 x daily - 7 x weekly - 2 sets - 10 reps - 2 hold  ASSESSMENT:  CLINICAL IMPRESSION: Pt tolerated increased exercises without pain.  OBJECTIVE IMPAIRMENTS: decreased ROM, decreased strength, increased edema, impaired flexibility, and pain.   ACTIVITY LIMITATIONS: bending, sitting, standing, squatting, sleeping, stairs, and transfers  PARTICIPATION  LIMITATIONS: meal prep, cleaning, driving, shopping, and community activity  PERSONAL FACTORS: 1 comorbidity: Lymphedema are also affecting patient's functional outcome.   REHAB POTENTIAL: Good  CLINICAL DECISION MAKING: Stable/uncomplicated  EVALUATION COMPLEXITY: Moderate   GOALS: Goals reviewed with patient? Yes  SHORT TERM GOALS: Target date: 07/20/2024  MET   Patient to be independent with HEP. Baseline: Goal status: MET 07/12/24  2.  Decreased pain by 1 level. Baseline:  Goal status: MET 07/12/24  3.  Reduce edema by 2 cm. Baseline:  Goal status: MET 08/01/24   LONG TERM GOALS: Target date: 09/07/2024  10 weeks***CERT    Patient to be independent with self progressive HEP by time of discharge.              Baseline:  Goal status: INITIAL  2.  Increase active range of motion to 0 degrees extension.           Baseline:  Goal status: INITIAL  3.  Increase active flexion to 105 or more degrees of flexion. Baseline:  Goal status: MET 08/28/24  4.  Increase strength by 1 full grade of left lower extremity at time of discharge. Baseline:  Goal status: INITIAL  5.  Patient able to ambulate short community distances with no assistive device and minimal to no pain at discharge. Baseline: household Goal status: INITIAL  6.  Increased score of PS FS to greater than 7.33. Baseline: 5.33 Goal status: MET  08/28/24 7. Reduce max pain to 2/10 with all activities. Baseline: 4/10 Goal status: MET 08/28/24  8. Reduce edema by > 5cm in LLE. Baseline: at 49 cm Goal status: INITIAL       PLAN:  PT FREQUENCY: 2x/week  PT DURATION: 10 weeks  PLANNED INTERVENTIONS: 97164- PT Re-evaluation, 97110-Therapeutic exercises, 97530- Therapeutic activity, 97112- Neuromuscular re-education, 97535- Self Care, 02859- Manual therapy, [phone]- Gait training, [phone]- Vasopneumatic device, Patient/Family education, Stair training, Joint mobilization, and Moist heat  PLAN FOR NEXT  SESSION:  Continue for ROM/strength increases in knee.   Burnard CHRISTELLA Meth, PT 09/05/2024, 8:12 AM

## 2024-09-06 ENCOUNTER — Ambulatory Visit

## 2024-09-06 DIAGNOSIS — R2689 Other abnormalities of gait and mobility: Secondary | ICD-10-CM

## 2024-09-06 DIAGNOSIS — M25662 Stiffness of left knee, not elsewhere classified: Secondary | ICD-10-CM | POA: Diagnosis not present

## 2024-09-06 DIAGNOSIS — R600 Localized edema: Secondary | ICD-10-CM | POA: Diagnosis not present

## 2024-09-06 DIAGNOSIS — M6281 Muscle weakness (generalized): Secondary | ICD-10-CM

## 2024-09-06 DIAGNOSIS — M25562 Pain in left knee: Secondary | ICD-10-CM

## 2024-09-08 NOTE — Therapy (Signed)
 " OUTPATIENT PHYSICAL THERAPY LOWER EXTREMITY TREATMENT   Patient Name: Melissa Morrow MRN: [id number] DOB:1956-02-24, 68 y.o., female Today's Date: 09/11/2024  END OF SESSION:  PT End of Session - 09/11/24 0936     Visit Number 18    Number of Visits 29    Date for Recertification  10/04/24    PT Start Time 0800    PT Stop Time 0848    PT Time Calculation (min) 48 min    Activity Tolerance Patient tolerated treatment well    Behavior During Therapy [facility] for tasks assessed/performed            Past Medical History:  Diagnosis Date   Arthritis    Depression    DVT (deep venous thrombosis) (HCC)    Confirm location with patient.   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   BUNIONECTOMY  1987 and 1996   CESAREAN SECTION  1989 and 1991   DILATION AND CURETTAGE OF UTERUS  1988   GALLBLADDER SURGERY  09/21/2002   laparoscopic   HERNIA REPAIR  04/21/2024   umbilical hernia   Placement of [place] Filter  09/21/1986   TOTAL KNEE ARTHROPLASTY Left 06/16/2024   Procedure: ARTHROPLASTY, KNEE, TOTAL;  Surgeon: Vernetta Lonni GRADE, MD;  Location: [facility];  Service: Orthopedics;  Laterality: Left;   Patient Active Problem List   Diagnosis Date Noted   Status post total left knee replacement 06/16/2024   High blood pressure 03/11/2011   Hearing loss 03/11/2011   Contact lens/glasses fitting 03/11/2011   Menopause 03/11/2011   Family history of breast cancer 03/11/2011    PCP: [name], MD   REFERRING PROVIDER: [name], MD   REFERRING DIAG:  Diagnosis  M17.12 (ICD-10-CM) - Unilateral primary osteoarthritis, left knee  *Left total knee arthroplasty to be done on 06/16/24. Will have HHPT x 2 weeks prior to OPPT. Eval and treat OPPT. Will be ready by post op visit with  Dr. Vernetta on 06/29/24.   THERAPY DIAG:  Acute pain of left knee  Localized edema  Stiffness of left knee, not elsewhere classified  Muscle weakness (generalized)  Other abnormalities of gait and mobility  Rationale for Evaluation and Treatment: Rehabilitation  ONSET DATE: surgery 06/16/24 L TKA  SUBJECTIVE:   SUBJECTIVE STATEMENT: Pt states was in long car ride and reports no increase pain or swelling. PERTINENT HISTORY: + hx/o large blood clot in L LE leading to + hx/o swelling/lyphedema PAIN: Are you having pain? Yes: NPRS scale: 0/10 today Pain location: anterior mostly Pain description: sharp Aggravating factors: cpm, bending, walking Relieving factors: rest, medication  PRECAUTIONS: None  RED FLAGS: None   WEIGHT BEARING RESTRICTIONS: No  FALLS:  Has patient fallen in last 6 months? No  LIVING ENVIRONMENT: Lives with: lives with their spouse Lives in: House/apartment Stairs: Yes: External: 4 steps; can reach both Has following equipment at home: Single point cane  OCCUPATION: retired  PLOF: Independent  PATIENT GOALS: decrease pain and increase walking  NEXT MD VISIT: 08/02/24  OBJECTIVE:  Note: Objective measures were completed at Evaluation unless otherwise noted.  DIAGNOSTIC FINDINGS: 03/14/24  An AP and lateral of the left knee shows valgus malalignment with  tricompartment arthritis.  There are osteophytes  in all 3 compartments  especially the medial compartment and patellofemoral joint.   PATIENT SURVEYS:  PSFS: THE PATIENT SPECIFIC FUNCTIONAL SCALE  Place score of 0-10 (0 = unable to perform activity and 10 = able to perform activity at the same level as before injury or problem)  Activity Date: 06/29/24 Date: 08/28/24   Walking quickly 4 10   2.bending knee/squatting 5 9   3.transfers 7 9   4.      Total Score 5.33 9.33     Total Score = Sum of activity scores/number of activities  Minimally Detectable Change: 3 points (for  single activity); 2 points (for average score)  [name] (nd). The Patient Specific Functional Scale . Retrieved from [employer].com.pt   COGNITION: Overall cognitive status: Within functional limits for tasks assessed     SENSATION: WFL  EDEMA:  Circumferential: R leg 38 cm, L leg 49 cm  MUSCLE LENGTH: Hamstrings: Left moderate restriction  POSTURE: No Significant postural limitations  PALPATION: 1+ anterior L knee and jt line   LOWER EXTREMITY ROM:  A/P ROM Right eval Left eval Left 07/18/24 Left 07/20/24 Left 08/21/24 Left 09/06/24  Hip flexion        Hip extension        Hip abduction        Hip adduction        Hip internal rotation        Hip external rotation        Knee flexion  80/ 85 95/100 105 A supine 110 A 114 P 110A  112P  Knee extension  +11/ +8    +5 A supine  Ankle dorsiflexion        Ankle plantarflexion        Ankle inversion        Ankle eversion         (Blank rows = not tested)  LOWER EXTREMITY MMT:  MMT Right eval Left eval Left  08/03/24 Left  08/28/24 Left  09/06/24  Hip flexion  4+  5- 5-  Hip extension       Hip abduction  4  4+ 5-  Hip adduction       Hip internal rotation       Hip external rotation       Knee flexion  4- 4 4+ 5-  Knee extension  3+ 4- 4 4+  Ankle dorsiflexion       Ankle plantarflexion       Ankle inversion       Ankle eversion        (Blank rows = not tested)  LOWER EXTREMITY SPECIAL TESTS:  S/P- no special tests  FUNCTIONAL TESTS:  5 times sit to stand: 16 sec 12/17  11 sec   GAIT: Distance walked: household Assistive device utilized: Single point cane Level of assistance: Complete Independence Comments: ordering SPC for home 09/06/24 No AD used anymore and ambulating community distances.  TREATMENT  DATE: L TKA 09/11/24 Rec bike level 2 8 min Leg Press 106# 3x15 #50 2x15 Incline board stretch 4x30 LAQ #4 3x10 SAQ #3 3x10 Bridges 2x10 Seated slides for knee flexion 15x  Vaso with L LE elevation at 34 degrees ands medium compression x10 min  09/06/24 Rec bike level 2 8 min Leg Press 106# 3x15 #50 2x15 Incline board stretch 4x30 LAQ #4 3x10 SLR #3 3x10 Bridges 2x10 Seated slides for knee flexion 15x  Vaso with L LE elevation at 34 degrees ands medium compression x10 min   09/05/24 Rec bike level 2 8 min Leg Press 100# 3x15 #50 2x15 Incline board stretch 3x30 LAQ #4 3x10 SLR #3 3x10 Bridges 2x10 Seated slides for knee flexion 15x  Vaso with L LE elevation at 34 degrees ands medium compression x10 min   PATIENT EDUCATION:  Education details: HEP Person educated: Patient Education method: Programmer, Multimedia, Demonstration, Actor cues, and Verbal cues Education comprehension: verbalized understanding, returned demonstration, and verbal cues required  HOME EXERCISE PROGRAM: Access Code: [email or web address] URL: [email or web address] Date: 06/29/2024 Prepared by: Burnard Meth  Exercises - Supine Quad Set  - 2 x daily - 7 x weekly - 2 sets - 10 reps - 3 hold - Supine Active Straight Leg Raise  - 2 x daily - 7 x weekly - 2 sets - 10 reps - Supine Heel Slide  - 2 x daily - 7 x weekly - 2 sets - 10 reps - 3 hold - Mini Squat with Counter Support  - 1 x daily - 7 x weekly - 2 sets - 10 reps - 2 hold  ASSESSMENT:  CLINICAL IMPRESSION: Pt progressing toward final goals.  OBJECTIVE IMPAIRMENTS: decreased ROM, decreased strength, increased edema, impaired flexibility, and pain.   ACTIVITY LIMITATIONS: bending, sitting, standing, squatting, sleeping, stairs, and transfers  PARTICIPATION LIMITATIONS: meal prep, cleaning, driving, shopping, and community activity  PERSONAL FACTORS: 1 comorbidity: Lymphedema are also affecting patient's functional outcome.   REHAB  POTENTIAL: Good  CLINICAL DECISION MAKING: Stable/uncomplicated  EVALUATION COMPLEXITY: Moderate   GOALS: Goals reviewed with patient? Yes  SHORT TERM GOALS: Target date: 07/20/2024  MET   Patient to be independent with HEP. Baseline: Goal status: MET 07/12/24  2.  Decreased pain by 1 level. Baseline:  Goal status: MET 07/12/24  3.  Reduce edema by 2 cm. Baseline:  Goal status: MET 08/01/24   LONG TERM GOALS: Target date: 10/04/2024  4 weeks   Patient to be independent with self progressive HEP by time of discharge.              Baseline:  Goal status: INITIAL  2.  Increase active range of motion to 0 degrees extension.           Baseline:  Goal status: ONGOING 09/06/24 at +5 extension  3.  Increase active flexion to 105 or more degrees of flexion. Baseline:  Goal status: MET 08/28/24  4.  Increase strength by 1 full grade of left lower extremity at time of discharge. Baseline:  Goal status: ONGOING 09/06/24   5.  Patient able to ambulate short community distances with no assistive device and minimal to no pain at discharge. Baseline: household Goal status: MET 09/06/24  6.  Increased score of PS FS to greater than 7.33. Baseline: 5.33 Goal status: MET 08/28/24 7. Reduce max pain to 2/10 with all activities. Baseline: 4/10 Goal status: MET 08/28/24  8. Reduce edema by > 5cm in LLE. Baseline: at 49 cm  Goal status: ONGOING 09/06/24   PLAN:  PT FREQUENCY:1-2x week  PT DURATION: 4 weeks  PLANNED INTERVENTIONS: 97164- PT Re-evaluation, 97110-Therapeutic exercises, 97530- Therapeutic activity, 97112- Neuromuscular re-education, 97535- Self Care, 02859- Manual therapy, [phone]- Gait training, [phone]- Vasopneumatic device, Patient/Family education, Stair training, Joint mobilization, and Moist heat  PLAN FOR NEXT SESSION:  Continue for ROM/strength increases in knee.   Burnard CHRISTELLA Meth, PT 09/11/2024, 9:39 AM  "

## 2024-09-11 ENCOUNTER — Ambulatory Visit (INDEPENDENT_AMBULATORY_CARE_PROVIDER_SITE_OTHER)

## 2024-09-11 DIAGNOSIS — R2689 Other abnormalities of gait and mobility: Secondary | ICD-10-CM | POA: Diagnosis not present

## 2024-09-11 DIAGNOSIS — M25562 Pain in left knee: Secondary | ICD-10-CM | POA: Diagnosis not present

## 2024-09-11 DIAGNOSIS — R600 Localized edema: Secondary | ICD-10-CM | POA: Diagnosis not present

## 2024-09-11 DIAGNOSIS — M25662 Stiffness of left knee, not elsewhere classified: Secondary | ICD-10-CM | POA: Diagnosis not present

## 2024-09-11 DIAGNOSIS — M6281 Muscle weakness (generalized): Secondary | ICD-10-CM | POA: Diagnosis not present

## 2024-09-18 ENCOUNTER — Encounter

## 2024-09-18 NOTE — Therapy (Signed)
 " OUTPATIENT PHYSICAL THERAPY LOWER EXTREMITY TREATMENT   Patient Name: Melissa Morrow MRN: [id number] DOB:1956/01/03, 68 y.o., female Today's Date: 09/19/2024  END OF SESSION:  PT End of Session - 09/19/24 0758     Visit Number 19    Number of Visits 29    Date for Recertification  10/04/24    PT Start Time 0715    PT Stop Time 0803    PT Time Calculation (min) 48 min    Activity Tolerance Patient tolerated treatment well    Behavior During Therapy [facility] for tasks assessed/performed             Past Medical History:  Diagnosis Date   Arthritis    Depression    DVT (deep venous thrombosis) (HCC)    Confirm location with patient.   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   BUNIONECTOMY  1987 and 1996   CESAREAN SECTION  1989 and 1991   DILATION AND CURETTAGE OF UTERUS  1988   GALLBLADDER SURGERY  09/21/2002   laparoscopic   HERNIA REPAIR  04/21/2024   umbilical hernia   Placement of [place]field Filter  09/21/1986   TOTAL KNEE ARTHROPLASTY Left 06/16/2024   Procedure: ARTHROPLASTY, KNEE, TOTAL;  Surgeon: Vernetta Lonni GRADE, MD;  Location: [facility];  Service: Orthopedics;  Laterality: Left;   Patient Active Problem List   Diagnosis Date Noted   Status post total left knee replacement 06/16/2024   High blood pressure 03/11/2011   Hearing loss 03/11/2011   Contact lens/glasses fitting 03/11/2011   Menopause 03/11/2011   Family history of breast cancer 03/11/2011    PCP: Melissa Morrow., MD   REFERRING PROVIDER: [name] JONETTA Morrow., MD   REFERRING DIAG:  Diagnosis  M17.12 (ICD-10-CM) - Unilateral primary osteoarthritis, left knee  *Left total knee arthroplasty to be done on 06/16/24. Will have HHPT x 2 weeks prior to OPPT. Eval and treat OPPT. Will be ready by post op visit  with Dr. Vernetta on 06/29/24.   THERAPY DIAG:  Acute pain of left knee  Localized edema  Stiffness of left knee, not elsewhere classified  Muscle weakness (generalized)  Other abnormalities of gait and mobility  Rationale for Evaluation and Treatment: Rehabilitation  ONSET DATE: surgery 06/16/24 L TKA  SUBJECTIVE:   SUBJECTIVE STATEMENT: Pt reports occasional  twinges on stairs. PERTINENT HISTORY: + hx/o large blood clot in L LE leading to + hx/o swelling/lyphedema PAIN: Are you having pain? Yes: NPRS scale: 0/10 today Pain location: anterior mostly Pain description: sharp Aggravating factors: cpm, bending, walking Relieving factors: rest, medication  PRECAUTIONS: None  RED FLAGS: None   WEIGHT BEARING RESTRICTIONS: No  FALLS:  Has patient fallen in last 6 months? No  LIVING ENVIRONMENT: Lives with: lives with their spouse Lives in: House/apartment Stairs: Yes: External: 4 steps; can reach both Has following equipment at home: Single point cane  OCCUPATION: retired  PLOF: Independent  PATIENT GOALS: decrease pain and increase walking  NEXT MD VISIT: 08/02/24  OBJECTIVE:  Note: Objective measures were completed at Evaluation unless otherwise noted.  DIAGNOSTIC FINDINGS: 03/14/24  An AP and lateral of the left knee shows valgus malalignment with  tricompartment arthritis.  There are osteophytes in all 3 compartments  especially  the medial compartment and patellofemoral joint.   PATIENT SURVEYS:  PSFS: THE PATIENT SPECIFIC FUNCTIONAL SCALE  Place score of 0-10 (0 = unable to perform activity and 10 = able to perform activity at the same level as before injury or problem)  Activity Date: 06/29/24 Date: 08/28/24   Walking quickly 4 10   2.bending knee/squatting 5 9   3.transfers 7 9   4.      Total Score 5.33 9.33     Total Score = Sum of activity scores/number of activities  Minimally Detectable Change: 3 points (for single activity); 2 points  (for average score)  [name] (nd). The Patient Specific Functional Scale . Retrieved from Skateo[employer].com.[email or web address]   COGNITION: Overall cognitive status: Within functional limits for tasks assessed     SENSATION: WFL  EDEMA:  Circumferential: R leg 38 cm, L leg 49 cm  MUSCLE LENGTH: Hamstrings: Left moderate restriction  POSTURE: No Significant postural limitations  PALPATION: 1+ anterior L knee and jt line   LOWER EXTREMITY ROM:  A/P ROM Right eval Left eval Left 07/18/24 Left 07/20/24 Left 08/21/24 Left 09/06/24 Left  09/19/24  Hip flexion         Hip extension         Hip abduction         Hip adduction         Hip internal rotation         Hip external rotation         Knee flexion  80/ 85 95/100 105 A supine 110 A 114 P 110A  112P 110A  Knee extension  +11/ +8    +5 A supine   Ankle dorsiflexion         Ankle plantarflexion         Ankle inversion         Ankle eversion          (Blank rows = not tested)  LOWER EXTREMITY MMT:  MMT Right eval Left eval Left  08/03/24 Left  08/28/24 Left  09/06/24  Hip flexion  4+  5- 5-  Hip extension       Hip abduction  4  4+ 5-  Hip adduction       Hip internal rotation       Hip external rotation       Knee flexion  4- 4 4+ 5-  Knee extension  3+ 4- 4 4+  Ankle dorsiflexion       Ankle plantarflexion       Ankle inversion       Ankle eversion        (Blank rows = not tested)  LOWER EXTREMITY SPECIAL TESTS:  S/P- no special tests  FUNCTIONAL TESTS:  5 times sit to stand: 16 sec 12/17  11 sec   GAIT: Distance walked: household Assistive device utilized: Single point cane Level of assistance: Complete Independence Comments: ordering SPC for home 09/06/24 No AD used anymore and ambulating community distances.  TREATMENT DATE: L TKA 09/19/24 Rec bike level 2 8 min Leg Press 106# 3x15 #50 2x15 Incline board stretch 4x30 LAQ #4 3x10 SAQ #3 3x10 Bridges 2x10 Supine slides for knee flexion 15x  Vaso with L LE elevation at 34 degrees ands medium compression x10 min    09/11/24 Rec bike level 2 8 min Leg Press 106# 3x15 #50 2x15 Incline board stretch 4x30 LAQ #4 3x10 SAQ #3 3x10 Bridges 2x10 Seated slides for knee flexion 15x  Vaso with L LE elevation at 34 degrees ands medium compression x10 min  09/06/24 Rec bike level 2 8 min Leg Press 106# 3x15 #50 2x15 Incline board stretch 4x30 LAQ #4 3x10 SLR #3 3x10 Bridges 2x10 Seated slides for knee flexion 15x  Vaso with L LE elevation at 34 degrees ands medium compression x10 min   09/05/24 Rec bike level 2 8 min Leg Press 100# 3x15 #50 2x15 Incline board stretch 3x30 LAQ #4 3x10 SLR #3 3x10 Bridges 2x10 Seated slides for knee flexion 15x  Vaso with L LE elevation at 34 degrees ands medium compression x10 min   PATIENT EDUCATION:  Education details: HEP Person educated: Patient Education method: Programmer, Multimedia, Demonstration, Actor cues, and Verbal cues Education comprehension: verbalized understanding, returned demonstration, and verbal cues required  HOME EXERCISE PROGRAM: Access Code: [email or web address] URL: [email or web address] Date: 06/29/2024 Prepared by: Burnard Meth  Exercises - Supine Quad Set  - 2 x daily - 7 x weekly - 2 sets - 10 reps - 3 hold - Supine Active Straight Leg Raise  - 2 x daily - 7 x weekly - 2 sets - 10 reps - Supine Heel Slide  - 2 x daily - 7 x weekly - 2 sets - 10 reps - 3 hold - Mini Squat with Counter Support  - 1 x daily - 7 x weekly - 2 sets - 10 reps - 2 hold  ASSESSMENT:  CLINICAL IMPRESSION: Edema still restricting flexion past 110. Pt progressing toward final goals.  OBJECTIVE IMPAIRMENTS: decreased ROM, decreased strength, increased edema, impaired flexibility, and pain.    ACTIVITY LIMITATIONS: bending, sitting, standing, squatting, sleeping, stairs, and transfers  PARTICIPATION LIMITATIONS: meal prep, cleaning, driving, shopping, and community activity  PERSONAL FACTORS: 1 comorbidity: Lymphedema are also affecting patient's functional outcome.   REHAB POTENTIAL: Good  CLINICAL DECISION MAKING: Stable/uncomplicated  EVALUATION COMPLEXITY: Moderate   GOALS: Goals reviewed with patient? Yes  SHORT TERM GOALS: Target date: 07/20/2024  MET   Patient to be independent with HEP. Baseline: Goal status: MET 07/12/24  2.  Decreased pain by 1 level. Baseline:  Goal status: MET 07/12/24  3.  Reduce edema by 2 cm. Baseline:  Goal status: MET 08/01/24   LONG TERM GOALS: Target date: 10/04/2024  4 weeks   Patient to be independent with self progressive HEP by time of discharge.              Baseline:  Goal status: INITIAL  2.  Increase active range of motion to 0 degrees extension.           Baseline:  Goal status: ONGOING 09/06/24 at +5 extension  3.  Increase active flexion to 105 or more degrees of flexion. Baseline:  Goal status: MET 08/28/24  4.  Increase strength by 1 full grade of left lower extremity at time of discharge. Baseline:  Goal status: ONGOING 09/06/24   5.  Patient able to ambulate short community distances with no assistive device and minimal to no pain at  discharge. Baseline: household Goal status: MET 09/06/24  6.  Increased score of PS FS to greater than 7.33. Baseline: 5.33 Goal status: MET 08/28/24 7. Reduce max pain to 2/10 with all activities. Baseline: 4/10 Goal status: MET 08/28/24  8. Reduce edema by > 5cm in LLE. Baseline: at 49 cm Goal status: ONGOING 09/06/24   PLAN:  PT FREQUENCY:1-2x week  PT DURATION: 4 weeks  PLANNED INTERVENTIONS: 97164- PT Re-evaluation, 97110-Therapeutic exercises, 97530- Therapeutic activity, 97112- Neuromuscular re-education, 97535- Self Care, 02859- Manual therapy,  [phone]- Gait training, [phone]- Vasopneumatic device, Patient/Family education, Stair training, Joint mobilization, and Moist heat  PLAN FOR NEXT SESSION:  Continue for ROM/strength increases in knee.   Burnard CHRISTELLA Meth, PT 09/19/2024, 7:59 AM  "

## 2024-09-19 ENCOUNTER — Ambulatory Visit (INDEPENDENT_AMBULATORY_CARE_PROVIDER_SITE_OTHER)

## 2024-09-19 DIAGNOSIS — M25562 Pain in left knee: Secondary | ICD-10-CM | POA: Diagnosis not present

## 2024-09-19 DIAGNOSIS — M6281 Muscle weakness (generalized): Secondary | ICD-10-CM | POA: Diagnosis not present

## 2024-09-19 DIAGNOSIS — R600 Localized edema: Secondary | ICD-10-CM | POA: Diagnosis not present

## 2024-09-19 DIAGNOSIS — M25662 Stiffness of left knee, not elsewhere classified: Secondary | ICD-10-CM | POA: Diagnosis not present

## 2024-09-19 DIAGNOSIS — R2689 Other abnormalities of gait and mobility: Secondary | ICD-10-CM

## 2024-09-20 ENCOUNTER — Ambulatory Visit (INDEPENDENT_AMBULATORY_CARE_PROVIDER_SITE_OTHER)

## 2024-09-20 DIAGNOSIS — M6281 Muscle weakness (generalized): Secondary | ICD-10-CM

## 2024-09-20 DIAGNOSIS — M25662 Stiffness of left knee, not elsewhere classified: Secondary | ICD-10-CM | POA: Diagnosis not present

## 2024-09-20 DIAGNOSIS — R600 Localized edema: Secondary | ICD-10-CM | POA: Diagnosis not present

## 2024-09-20 DIAGNOSIS — M25562 Pain in left knee: Secondary | ICD-10-CM

## 2024-09-20 DIAGNOSIS — R2689 Other abnormalities of gait and mobility: Secondary | ICD-10-CM | POA: Diagnosis not present

## 2024-09-20 NOTE — Therapy (Signed)
 " OUTPATIENT PHYSICAL THERAPY LOWER EXTREMITY TREATMENT   Patient Name: Melissa Morrow MRN: [id number] DOB:Sep 09, 1956, 68 y.o., female Today's Date: 09/20/2024  END OF SESSION:  PT End of Session - 09/20/24 1049     Visit Number 20    Number of Visits 29    Date for Recertification  10/04/24    PT Start Time 0800    PT Stop Time 0848    PT Time Calculation (min) 48 min    Activity Tolerance Patient tolerated treatment well    Behavior During Therapy [facility] for tasks assessed/performed              Past Medical History:  Diagnosis Date   Arthritis    Depression    DVT (deep venous thrombosis) (HCC)    Confirm location with patient.   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   BUNIONECTOMY  1987 and 1996   CESAREAN SECTION  1989 and 1991   DILATION AND CURETTAGE OF UTERUS  1988   GALLBLADDER SURGERY  09/21/2002   laparoscopic   HERNIA REPAIR  04/21/2024   umbilical hernia   Placement of [place] Filter  09/21/1986   TOTAL KNEE ARTHROPLASTY Left 06/16/2024   Procedure: ARTHROPLASTY, KNEE, TOTAL;  Surgeon: Vernetta Lonni GRADE, MD;  Location: [facility];  Service: Orthopedics;  Laterality: Left;   Patient Active Problem List   Diagnosis Date Noted   Status post total left knee replacement 06/16/2024   High blood pressure 03/11/2011   Hearing loss 03/11/2011   Contact lens/glasses fitting 03/11/2011   Menopause 03/11/2011   Family history of breast cancer 03/11/2011    PCP: [name], MD   REFERRING PROVIDER: Vernetta Lonni GRADE, MD   REFERRING DIAG:  Diagnosis  M17.12 (ICD-10-CM) - Unilateral primary osteoarthritis, left knee  *Left total knee arthroplasty to be done on 06/16/24. Will have HHPT x 2 weeks prior to OPPT. Eval and treat OPPT. Will be ready by post op  visit with Dr. Vernetta on 06/29/24.   THERAPY DIAG:  Acute pain of left knee  Localized edema  Stiffness of left knee, not elsewhere classified  Muscle weakness (generalized)  Other abnormalities of gait and mobility  Rationale for Evaluation and Treatment: Rehabilitation  ONSET DATE: surgery 06/16/24 L TKA  SUBJECTIVE:   SUBJECTIVE STATEMENT: Pt reports less fatigue in the knee. PERTINENT HISTORY: + hx/o large blood clot in L LE leading to + hx/o swelling/lyphedema PAIN: Are you having pain? Yes: NPRS scale: 0/10 today Pain location: anterior mostly Pain description: sharp Aggravating factors: cpm, bending, walking Relieving factors: rest, medication  PRECAUTIONS: None  RED FLAGS: None   WEIGHT BEARING RESTRICTIONS: No  FALLS:  Has patient fallen in last 6 months? No  LIVING ENVIRONMENT: Lives with: lives with their spouse Lives in: House/apartment Stairs: Yes: External: 4 steps; can reach both Has following equipment at home: Single point cane  OCCUPATION: retired  PLOF: Independent  PATIENT GOALS: decrease pain and increase walking  NEXT MD VISIT: 08/02/24  OBJECTIVE:  Note: Objective measures were completed at Evaluation unless otherwise noted.  DIAGNOSTIC FINDINGS: 03/14/24  An AP and lateral of the left knee shows valgus malalignment with  tricompartment arthritis.  There are osteophytes in all 3 compartments  especially  the medial compartment and patellofemoral joint.   PATIENT SURVEYS:  PSFS: THE PATIENT SPECIFIC FUNCTIONAL SCALE  Place score of 0-10 (0 = unable to perform activity and 10 = able to perform activity at the same level as before injury or problem)  Activity Date: 06/29/24 Date: 08/28/24   Walking quickly 4 10   2.bending knee/squatting 5 9   3.transfers 7 9   4.      Total Score 5.33 9.33     Total Score = Sum of activity scores/number of activities  Minimally Detectable Change: 3 points (for single activity); 2 points  (for average score)  [name] [facility] [facility] (nd). The Patient Specific Functional Scale . Retrieved from [email or web address]   COGNITION: Overall cognitive status: Within functional limits for tasks assessed     SENSATION: WFL  EDEMA:  Circumferential: R leg 38 cm, L leg 49 cm  MUSCLE LENGTH: Hamstrings: Left moderate restriction  POSTURE: No Significant postural limitations  PALPATION: 1+ anterior L knee and jt line   LOWER EXTREMITY ROM:  A/P ROM Right eval Left eval Left 07/18/24 Left 07/20/24 Left 08/21/24 Left 09/06/24 Left  09/19/24  Hip flexion         Hip extension         Hip abduction         Hip adduction         Hip internal rotation         Hip external rotation         Knee flexion  80/ 85 95/100 105 A supine 110 A 114 P 110A  112P 110A  Knee extension  +11/ +8    +5 A supine   Ankle dorsiflexion         Ankle plantarflexion         Ankle inversion         Ankle eversion          (Blank rows = not tested)  LOWER EXTREMITY MMT:  MMT Right eval Left eval Left  08/03/24 Left  08/28/24 Left  09/06/24  Hip flexion  4+  5- 5-  Hip extension       Hip abduction  4  4+ 5-  Hip adduction       Hip internal rotation       Hip external rotation       Knee flexion  4- 4 4+ 5-  Knee extension  3+ 4- 4 4+  Ankle dorsiflexion       Ankle plantarflexion       Ankle inversion       Ankle eversion        (Blank rows = not tested)  LOWER EXTREMITY SPECIAL TESTS:  S/P- no special tests  FUNCTIONAL TESTS:  5 times sit to stand: 16 sec 12/17  11 sec   GAIT: Distance walked: household Assistive device utilized: Single point cane Level of assistance: Complete Independence Comments: ordering SPC for home 09/06/24 No AD used anymore and ambulating community distances.  TREATMENT DATE: L TKA 09/20/24 Rec bike level 2 8 min Leg Press 106# 3x12 #50 2x15 Incline board stretch 4x30 LAQ #4 3x10 SAQ #3 3x10 Quad sets 2x10 with towel under ankle  Vaso with L LE elevation at 34 degrees ands medium compression x10 min   09/19/24 Rec bike level 2 8 min Leg Press 106# 3x12 #50 2x15 Incline board stretch 4x30 LAQ #4 3x10 SAQ #3 3x10 Bridges 2x10 Supine slides for knee flexion 15x  Vaso with L LE elevation at 34 degrees ands medium compression x10 min  09/11/24 Rec bike level 2 8 min Leg Press 106# 3x15 #50 2x15 Incline board stretch 4x30 LAQ #4 3x10 SAQ #3 3x10 Bridges 2x10 Seated slides for knee flexion 15x  Vaso with L LE elevation at 34 degrees ands medium compression x10 min   PATIENT EDUCATION:  Education details: HEP Person educated: Patient Education method: Programmer, Multimedia, Demonstration, Actor cues, and Verbal cues Education comprehension: verbalized understanding, returned demonstration, and verbal cues required  HOME EXERCISE PROGRAM: Access Code: [email or web address] URL: [email or web address] Date: 06/29/2024 Prepared by: Burnard Meth  Exercises - Supine Quad Set  - 2 x daily - 7 x weekly - 2 sets - 10 reps - 3 hold - Supine Active Straight Leg Raise  - 2 x daily - 7 x weekly - 2 sets - 10 reps - Supine Heel Slide  - 2 x daily - 7 x weekly - 2 sets - 10 reps - 3 hold - Mini Squat with Counter Support  - 1 x daily - 7 x weekly - 2 sets - 10 reps - 2 hold  ASSESSMENT:  CLINICAL IMPRESSION: Pt progressing toward final ROM/strength goals.  OBJECTIVE IMPAIRMENTS: decreased ROM, decreased strength, increased edema, impaired flexibility, and pain.   ACTIVITY LIMITATIONS: bending, sitting, standing, squatting, sleeping, stairs, and transfers  PARTICIPATION LIMITATIONS: meal prep, cleaning, driving, shopping, and community activity  PERSONAL FACTORS: 1 comorbidity: Lymphedema are also affecting patient's functional outcome.    REHAB POTENTIAL: Good  CLINICAL DECISION MAKING: Stable/uncomplicated  EVALUATION COMPLEXITY: Moderate   GOALS: Goals reviewed with patient? Yes  SHORT TERM GOALS: Target date: 07/20/2024  MET   Patient to be independent with HEP. Baseline: Goal status: MET 07/12/24  2.  Decreased pain by 1 level. Baseline:  Goal status: MET 07/12/24  3.  Reduce edema by 2 cm. Baseline:  Goal status: MET 08/01/24   LONG TERM GOALS: Target date: 10/04/2024  4 weeks   Patient to be independent with self progressive HEP by time of discharge.              Baseline:  Goal status: INITIAL  2.  Increase active range of motion to 0 degrees extension.           Baseline:  Goal status: ONGOING 09/06/24 at +5 extension  3.  Increase active flexion to 105 or more degrees of flexion. Baseline:  Goal status: MET 08/28/24  4.  Increase strength by 1 full grade of left lower extremity at time of discharge. Baseline:  Goal status: ONGOING 09/06/24   5.  Patient able to ambulate short community distances with no assistive device and minimal to no pain at discharge. Baseline: household Goal status: MET 09/06/24  6.  Increased score of PS FS to greater than 7.33. Baseline: 5.33 Goal status: MET 08/28/24 7. Reduce max pain to 2/10 with all activities. Baseline: 4/10 Goal status: MET 08/28/24  8. Reduce edema by > 5cm in LLE. Baseline: at 49 cm Goal  status: ONGOING 09/06/24   PLAN:  PT FREQUENCY:1-2x week  PT DURATION: 4 weeks  PLANNED INTERVENTIONS: 97164- PT Re-evaluation, 97110-Therapeutic exercises, 97530- Therapeutic activity, 97112- Neuromuscular re-education, 97535- Self Care, 02859- Manual therapy, [phone]- Gait training, [phone]- Vasopneumatic device, Patient/Family education, Stair training, Joint mobilization, and Moist heat  PLAN FOR NEXT SESSION:  Continue for ROM/strength increases in knee.   Burnard CHRISTELLA Meth, PT 09/20/2024, 10:49 AM  "

## 2024-09-26 NOTE — Therapy (Signed)
 " OUTPATIENT PHYSICAL THERAPY LOWER EXTREMITY TREATMENT   Patient Name: Melissa Morrow MRN: [id number] DOB:Sep 05, 1956, 69 y.o., female Today's Date: 09/27/2024  END OF SESSION:  PT End of Session - 09/27/24 1142     Visit Number 21    Number of Visits 29    Date for Recertification  10/04/24    PT Start Time 1145    PT Stop Time 1233    PT Time Calculation (min) 48 min    Activity Tolerance Patient tolerated treatment well    Behavior During Therapy WFL for tasks assessed/performed               Past Medical History:  Diagnosis Date   Arthritis    Depression    DVT (deep venous thrombosis) (HCC)    Confirm location with patient.   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   BUNIONECTOMY  1987 and 1996   CESAREAN SECTION  1989 and 1991   DILATION AND CURETTAGE OF UTERUS  1988   GALLBLADDER SURGERY  09/21/2002   laparoscopic   HERNIA REPAIR  04/21/2024   umbilical hernia   Placement of Greenfield Filter  09/21/1986   TOTAL KNEE ARTHROPLASTY Left 06/16/2024   Procedure: ARTHROPLASTY, KNEE, TOTAL;  Surgeon: Melissa Lonni GRADE, MD;  Location: [facility];  Service: Orthopedics;  Laterality: Left;   Patient Active Problem List   Diagnosis Date Noted   Status post total left knee replacement 06/16/2024   High blood pressure 03/11/2011   Hearing loss 03/11/2011   Contact lens/glasses fitting 03/11/2011   Menopause 03/11/2011   Family history of breast cancer 03/11/2011    PCP: Melissa Morrow., MD   REFERRING PROVIDER: Vernetta Lonni GRADE, MD   REFERRING DIAG:  Diagnosis  M17.12 (ICD-10-CM) - Unilateral primary osteoarthritis, left knee  *Left total knee arthroplasty to be done on 06/16/24. Will have HHPT x 2 weeks prior to OPPT. Eval and treat OPPT. Will be ready by post op  visit with Dr. Vernetta on 06/29/24.   THERAPY DIAG:  Acute pain of left knee  Localized edema  Stiffness of left knee, not elsewhere classified  Muscle weakness (generalized)  Other abnormalities of gait and mobility  Rationale for Evaluation and Treatment: Rehabilitation  ONSET DATE: surgery 06/16/24 L TKA  SUBJECTIVE:   SUBJECTIVE STATEMENT: Pt reports increasing fitness walking but having a fall onto the R knee due to a crack in the sidewalk.  No issues with the L knee. PERTINENT HISTORY: + hx/o large blood clot in L LE leading to + hx/o swelling/lyphedema PAIN: Are you having pain? Yes: NPRS scale: 0/10 today Pain location: anterior mostly Pain description: sharp Aggravating factors: cpm, bending, walking Relieving factors: rest, medication  PRECAUTIONS: None  RED FLAGS: None   WEIGHT BEARING RESTRICTIONS: No  FALLS:  Has patient fallen in last 6 months? No  LIVING ENVIRONMENT: Lives with: lives with their spouse Lives in: House/apartment Stairs: Yes: External: 4 steps; can reach both Has following equipment at home: Single point cane  OCCUPATION: retired  PLOF: Independent  PATIENT GOALS: decrease pain and increase walking  NEXT MD VISIT: 08/02/24  OBJECTIVE:  Note: Objective measures were completed at Evaluation unless otherwise noted.  DIAGNOSTIC FINDINGS: 03/14/24  An AP and lateral  of the left knee shows valgus malalignment with  tricompartment arthritis.  There are osteophytes in all 3 compartments  especially the medial compartment and patellofemoral joint.   PATIENT SURVEYS:  PSFS: THE PATIENT SPECIFIC FUNCTIONAL SCALE  Place score of 0-10 (0 = unable to perform activity and 10 = able to perform activity at the same level as before injury or problem)  Activity Date: 06/29/24 Date: 08/28/24 Date: 1//26  Walking quickly 4 10   2.bending knee/squatting 5 9   3.transfers 7 9   4.      Total Score 5.33 9.33     Total Score = Sum of  activity scores/number of activities  Minimally Detectable Change: 3 points (for single activity); 2 points (for average score)  [facility] (nd). The Patient Specific Functional Scale . Retrieved from [email or web address].[email or web address]   COGNITION: Overall cognitive status: Within functional limits for tasks assessed     SENSATION: WFL  EDEMA:  Circumferential: R leg 38 cm, L leg 49 cm  MUSCLE LENGTH: Hamstrings: Left moderate restriction  POSTURE: No Significant postural limitations  PALPATION: 1+ anterior L knee and jt line   LOWER EXTREMITY ROM:  A/P ROM Right eval Left eval Left 07/18/24 Left 07/20/24 Left 08/21/24 Left 09/06/24 Left  09/19/24  Hip flexion         Hip extension         Hip abduction         Hip adduction         Hip internal rotation         Hip external rotation         Knee flexion  80/ 85 95/100 105 A supine 110 A 114 P 110A  112P 110A  Knee extension  +11/ +8    +5 A supine   Ankle dorsiflexion         Ankle plantarflexion         Ankle inversion         Ankle eversion          (Blank rows = not tested)  LOWER EXTREMITY MMT:  MMT Right eval Left eval Left  08/03/24 Left  08/28/24 Left  09/06/24  Hip flexion  4+  5- 5-  Hip extension       Hip abduction  4  4+ 5-  Hip adduction       Hip internal rotation       Hip external rotation       Knee flexion  4- 4 4+ 5-  Knee extension  3+ 4- 4 4+  Ankle dorsiflexion       Ankle plantarflexion       Ankle inversion       Ankle eversion        (Blank rows = not tested)  LOWER EXTREMITY SPECIAL TESTS:  S/P- no special tests  FUNCTIONAL TESTS:  5 times sit to stand: 16 sec 12/17  11 sec   GAIT: Distance walked: household Assistive device utilized: Single point cane Level of assistance: Complete Independence Comments: ordering SPC for home 09/06/24 No AD used anymore and ambulating community distances.  TREATMENT DATE: L TKA 09/27/24 Scifit 5 min Leg Press 112# 3x12 #56 2x15 Incline board stretch 4x30 LAQ #4 3x10 SAQ #3 3x10 On Air ex UUDD leading with the L 20x2 On Air ex tandem    Vaso with L LE elevation at 34 degrees ands medium compression x10 min  09/20/24 Rec bike level 2 8 min Leg Press 106# 3x12 #50 2x15 Incline board stretch 4x30 LAQ #4 3x10 SAQ #3 3x10 Quad sets 2x10 with towel under ankle  Vaso with L LE elevation at 34 degrees ands medium compression x10 min   09/19/24 Rec bike level 2 8 min Leg Press 106# 3x12 #50 2x15 Incline board stretch 4x30 LAQ #4 3x10 SAQ #3 3x10 Bridges 2x10 Supine slides for knee flexion 15x  Vaso with L LE elevation at 34 degrees ands medium compression x10 min  09/11/24 Rec bike level 2 8 min Leg Press 106# 3x15 #50 2x15 Incline board stretch 4x30 LAQ #4 3x10 SAQ #3 3x10 Bridges 2x10 Seated slides for knee flexion 15x  Vaso with L LE elevation at 34 degrees ands medium compression x10 min   PATIENT EDUCATION:  Education details: HEP Person educated: Patient Education method: Programmer, Multimedia, Demonstration, Actor cues, and Verbal cues Education comprehension: verbalized understanding, returned demonstration, and verbal cues required  HOME EXERCISE PROGRAM: Access Code: [email or web address] URL: [email or web address] Date: 06/29/2024 Prepared by: Burnard Meth  Exercises - Supine Quad Set  - 2 x daily - 7 x weekly - 2 sets - 10 reps - 3 hold - Supine Active Straight Leg Raise  - 2 x daily - 7 x weekly - 2 sets - 10 reps - Supine Heel Slide  - 2 x daily - 7 x weekly - 2 sets - 10 reps - 3 hold - Mini Squat with Counter Support  - 1 x daily - 7 x weekly - 2 sets - 10 reps - 2 hold  ASSESSMENT:  CLINICAL IMPRESSION: Ptchallenged by balance exercises.  Unable to tandem balance for 25 sec without some sway and need for  bar use.   OBJECTIVE IMPAIRMENTS: decreased ROM, decreased strength, increased edema, impaired flexibility, and pain.   ACTIVITY LIMITATIONS: bending, sitting, standing, squatting, sleeping, stairs, and transfers  PARTICIPATION LIMITATIONS: meal prep, cleaning, driving, shopping, and community activity  PERSONAL FACTORS: 1 comorbidity: Lymphedema are also affecting patient's functional outcome.   REHAB POTENTIAL: Good  CLINICAL DECISION MAKING: Stable/uncomplicated  EVALUATION COMPLEXITY: Moderate   GOALS: Goals reviewed with patient? Yes  SHORT TERM GOALS: Target date: 07/20/2024  MET   Patient to be independent with HEP. Baseline: Goal status: MET 07/12/24  2.  Decreased pain by 1 level. Baseline:  Goal status: MET 07/12/24  3.  Reduce edema by 2 cm. Baseline:  Goal status: MET 08/01/24   LONG TERM GOALS: Target date: 10/04/2024  4 weeks   Patient to be independent with self progressive HEP by time of discharge.              Baseline:  Goal status: INITIAL  2.  Increase active range of motion to 0 degrees extension.           Baseline:  Goal status: ONGOING 09/06/24 at +5 extension  3.  Increase active flexion to 105 or more degrees of flexion. Baseline:  Goal status: MET 08/28/24  4.  Increase strength by 1 full Morrow of left lower extremity at time of discharge. Baseline:  Goal status: ONGOING 09/06/24   5.  Patient able to ambulate short community  distances with no assistive device and minimal to no pain at discharge. Baseline: household Goal status: MET 09/06/24  6.  Increased score of PS FS to greater than 7.33. Baseline: 5.33 Goal status: MET 08/28/24 7. Reduce max pain to 2/10 with all activities. Baseline: 4/10 Goal status: MET 08/28/24  8. Reduce edema by > 5cm in LLE. Baseline: at 49 cm Goal status: ONGOING 09/06/24   PLAN:  PT FREQUENCY:1-2x week  PT DURATION: 4 weeks  PLANNED INTERVENTIONS: 97164- PT Re-evaluation, 97110-Therapeutic  exercises, 97530- Therapeutic activity, 97112- Neuromuscular re-education, 97535- Self Care, 02859- Manual therapy, [phone]- Gait training, [phone]- Vasopneumatic device, Patient/Family education, Stair training, Joint mobilization, and Moist heat  PLAN FOR NEXT SESSION:  Discharge next visit.   Burnard CHRISTELLA Meth, PT 09/27/2024, 12:31 PM  "

## 2024-09-27 ENCOUNTER — Ambulatory Visit

## 2024-09-27 DIAGNOSIS — R600 Localized edema: Secondary | ICD-10-CM | POA: Diagnosis not present

## 2024-09-27 DIAGNOSIS — M6281 Muscle weakness (generalized): Secondary | ICD-10-CM

## 2024-09-27 DIAGNOSIS — M25562 Pain in left knee: Secondary | ICD-10-CM

## 2024-09-27 DIAGNOSIS — M25662 Stiffness of left knee, not elsewhere classified: Secondary | ICD-10-CM | POA: Diagnosis not present

## 2024-09-27 DIAGNOSIS — R2689 Other abnormalities of gait and mobility: Secondary | ICD-10-CM

## 2024-09-27 NOTE — Therapy (Signed)
 " OUTPATIENT PHYSICAL THERAPY LOWER EXTREMITY TREATMENT/DISCHARGE   Patient Name: Melissa Morrow MRN: [id number] DOB:[birth year], 69 y.o., female Today's Date: 09/29/2024    PHYSICAL THERAPY DISCHARGE SUMMARY  Visits from Start of Care: 22  Current functional level related to goals / functional outcomes: See below   Remaining deficits: none   Education / Equipment: HEP   Patient agrees to discharge. Patient goals were met. Patient is being discharged due to meeting the stated rehab goals.   END OF SESSION:  PT End of Session - 09/29/24 0848     Visit Number 22    Number of Visits 29    Date for Recertification  10/04/24    PT Start Time 0849    PT Stop Time 0937    PT Time Calculation (min) 48 min    Activity Tolerance Patient tolerated treatment well    Behavior During Therapy [facility] for tasks assessed/performed           Past Medical History:  Diagnosis Date   Arthritis    Depression    DVT (deep venous thrombosis) (HCC)    Confirm location with patient.   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   BUNIONECTOMY  1987 and 1996   CESAREAN SECTION  1989 and 1991   DILATION AND CURETTAGE OF UTERUS  1988   GALLBLADDER SURGERY  09/21/2002   laparoscopic   HERNIA REPAIR  04/21/2024   umbilical hernia   Placement of [place] Filter  09/21/1986   TOTAL KNEE ARTHROPLASTY Left 06/16/2024   Procedure: ARTHROPLASTY, KNEE, TOTAL;  Surgeon: Vernetta Lonni GRADE, MD;  Location: [facility];  Service: Orthopedics;  Laterality: Left;   Patient Active Problem List   Diagnosis Date Noted   Status post total left knee replacement 06/16/2024   High blood pressure 03/11/2011   Hearing loss 03/11/2011   Contact lens/glasses fitting 03/11/2011   Menopause 03/11/2011   Family history of breast cancer  03/11/2011    PCP: [name], MD   REFERRING PROVIDER: Vernetta Lonni GRADE, MD   REFERRING DIAG:  Diagnosis  M17.12 (ICD-10-CM) - Unilateral primary osteoarthritis, left knee  *Left total knee arthroplasty to be done on 06/16/24. Will have HHPT x 2 weeks prior to OPPT. Eval and treat OPPT. Will be ready by post op visit with Dr. Vernetta on 06/29/24.   THERAPY DIAG:  Acute pain of left knee  Localized edema  Stiffness of left knee, not elsewhere classified  Muscle weakness (generalized)  Other abnormalities of gait and mobility  Rationale for Evaluation and Treatment: Rehabilitation  ONSET DATE: surgery 06/16/24 L TKA  SUBJECTIVE:   SUBJECTIVE STATEMENT: Pt reports feeling good in the knee just occasional stiffness.Melissa Morrow PERTINENT HISTORY: + hx/o large blood clot in L LE leading to + hx/o swelling/lyphedema PAIN: Are you having pain? Yes: NPRS scale: 0/10 today Pain location: anterior mostly Pain description: sharp Aggravating factors: cpm, bending, walking Relieving factors: rest, medication  PRECAUTIONS: None  RED FLAGS: None   WEIGHT BEARING RESTRICTIONS: No  FALLS:  Has patient fallen in last 6 months? No  LIVING ENVIRONMENT: Lives with: lives with their spouse Lives in: House/apartment Stairs: Yes: External: 4 steps; can reach both Has following equipment at home: Single point cane  OCCUPATION: retired  PLOF: Independent  PATIENT GOALS: decrease pain and increase walking  NEXT MD VISIT: 08/02/24  OBJECTIVE:  Note: Objective measures were completed at Evaluation unless otherwise noted.  DIAGNOSTIC FINDINGS: 03/14/24  An AP and lateral of the left knee shows valgus malalignment with  tricompartment arthritis.  There are osteophytes in all 3 compartments  especially the medial compartment and patellofemoral joint.   PATIENT SURVEYS:  PSFS: THE PATIENT SPECIFIC FUNCTIONAL SCALE  Place score of 0-10 (0 = unable to perform activity and  10 = able to perform activity at the same level as before injury or problem)  Activity Date: 06/29/24 Date: 08/28/24 Date: 09/29/24  Walking quickly 4 10 10   2.bending knee/squatting 5 9 10   3.transfers 7 9 10   4.      Total Score 5.33 9.33 10    Total Score = Sum of activity scores/number of activities  Minimally Detectable Change: 3 points (for single activity); 2 points (for average score)  [name] (nd). The Patient Specific Functional Scale . Retrieved from [email or web address].[email or web address]   COGNITION: Overall cognitive status: Within functional limits for tasks assessed     SENSATION: [facility]  EDEMA:  Circumferential: R leg 38 cm, L leg 49 cm  47 cm 09/29/24  MUSCLE LENGTH: Hamstrings: Left moderate restriction  POSTURE: No Significant postural limitations  PALPATION: 1+ anterior L knee and jt line   LOWER EXTREMITY ROM:  A/P ROM Right eval Left eval Left 07/18/24 Left 07/20/24 Left 08/21/24 Left 09/06/24 Left  09/19/24 Left  09/29/24  Hip flexion          Hip extension          Hip abduction          Hip adduction          Hip internal rotation          Hip external rotation          Knee flexion  80/ 85 95/100 105 A supine 110 A 114 P 110A  112P 110A 110A  Knee extension  +11/ +8    +5 A supine  +3A supine/0P  Ankle dorsiflexion          Ankle plantarflexion          Ankle inversion          Ankle eversion           (Blank rows = not tested)  LOWER EXTREMITY MMT:  MMT Right eval Left eval Left  08/03/24 Left  08/28/24 Left  09/06/24 Left  09/29/24  Hip flexion  4+  5- 5- 5  Hip extension        Hip abduction  4  4+ 5- 5  Hip adduction        Hip internal rotation        Hip external rotation        Knee flexion  4- 4 4+ 5- 5  Knee extension  3+ 4- 4 4+ 5-  Ankle dorsiflexion        Ankle plantarflexion        Ankle inversion        Ankle eversion         (Blank rows = not tested)  LOWER  EXTREMITY SPECIAL TESTS:  S/P- no special tests  FUNCTIONAL TESTS:  5 times sit to stand: 16 sec 12/17  11 sec   GAIT: Distance walked: household Assistive device utilized: Single point cane Level of assistance: Complete Independence Comments: ordering  SPC for home 09/06/24 No AD used anymore and ambulating community distances.                                                                                                                               TREATMENT DATE: L TKA 09/29/24 Leg Press 112# 3x12 #56 2x15 Rec bike lev 3 8 min Incline board stretch 4x30 HEP review  Vaso with L LE elevation at 34 degrees ands medium compression x10 min   09/27/24 Scifit 5 min Leg Press 112# 3x12 #56 2x15 Incline board stretch 4x30 LAQ #4 3x10 SAQ #3 3x10 On Air ex UUDD leading with the L 20x2 On Air ex tandem    Vaso with L LE elevation at 34 degrees ands medium compression x10 min  09/20/24 Rec bike level 2 8 min Leg Press 106# 3x12 #50 2x15 Incline board stretch 4x30 LAQ #4 3x10 SAQ #3 3x10 Quad sets 2x10 with towel under ankle  Vaso with L LE elevation at 34 degrees ands medium compression x10 min  PATIENT EDUCATION:  Education details: HEP Person educated: Patient Education method: Programmer, Multimedia, Demonstration, Actor cues, and Verbal cues Education comprehension: verbalized understanding, returned demonstration, and verbal cues required  HOME EXERCISE PROGRAM: Access Code: [email or web address] URL: [email or web address] Date: 06/29/2024 Prepared by: Burnard Meth  Exercises - Supine Quad Set  - 2 x daily - 7 x weekly - 2 sets - 10 reps - 3 hold - Supine Active Straight Leg Raise  - 2 x daily - 7 x weekly - 2 sets - 10 reps - Supine Heel Slide  - 2 x daily - 7 x weekly - 2 sets - 10 reps - 3 hold - Mini Squat with Counter Support  - 1 x daily - 7 x weekly - 2 sets - 10 reps - 2 hold  ASSESSMENT:  CLINICAL IMPRESSION:   OBJECTIVE IMPAIRMENTS: decreased ROM, decreased  strength, increased edema, impaired flexibility, and pain.   ACTIVITY LIMITATIONS: bending, sitting, standing, squatting, sleeping, stairs, and transfers  PARTICIPATION LIMITATIONS: meal prep, cleaning, driving, shopping, and community activity  PERSONAL FACTORS: 1 comorbidity: Lymphedema are also affecting patient's functional outcome.   REHAB POTENTIAL: Good  CLINICAL DECISION MAKING: Stable/uncomplicated  EVALUATION COMPLEXITY: Moderate   GOALS: Goals reviewed with patient? Yes  SHORT TERM GOALS: Target date: 07/20/2024  MET   Patient to be independent with HEP. Baseline: Goal status: MET 07/12/24  2.  Decreased pain by 1 level. Baseline:  Goal status: MET 07/12/24  3.  Reduce edema by 2 cm. Baseline:  Goal status: MET 08/01/24   LONG TERM GOALS: Target date: 10/04/2024  4 weeks MET   Patient to be independent with self progressive HEP by time of discharge.              Baseline:  Goal status: Met 09/29/24  2.  Increase active range of motion to 0 degrees extension.  Baseline:  Goal status: Met 09/29/24 ( at +3A O P extension)  3.  Increase active flexion to 105 or more degrees of flexion. Baseline:  Goal status: MET 08/28/24  4.  Increase strength by 1 full grade of left lower extremity at time of discharge. Baseline:  Goal status: Met 09/29/24 5.  Patient able to ambulate short community distances with no assistive device and minimal to no pain at discharge. Baseline: household Goal status: MET 09/06/24  6.  Increased score of PS FS to greater than 7.33. Baseline: 5.33 Goal status: MET 08/28/24 7. Reduce max pain to 2/10 with all activities. Baseline: 4/10 Goal status: MET 08/28/24  8. Reduce edema by > 5cm in LLE. Baseline: at 49 cm Goal status: ONGOING 09/29/24 at -2-3 cm at DC  PLAN:  PT FREQUENCY:1-2x week  PT DURATION: 4 weeks  PLANNED INTERVENTIONS: 97164- PT Re-evaluation, 97110-Therapeutic exercises, 97530- Therapeutic activity, 97112-  Neuromuscular re-education, 97535- Self Care, 02859- Manual therapy, [phone]- Gait training, [phone]- Vasopneumatic device, Patient/Family education, Stair training, Joint mobilization, and Moist heat  PLAN FOR NEXT SESSION:  Discharge to HEP.   Burnard CHRISTELLA Meth, PT 09/29/2024, 11:56 AM  "

## 2024-09-29 ENCOUNTER — Ambulatory Visit

## 2024-09-29 DIAGNOSIS — R600 Localized edema: Secondary | ICD-10-CM | POA: Diagnosis not present

## 2024-09-29 DIAGNOSIS — M6281 Muscle weakness (generalized): Secondary | ICD-10-CM | POA: Diagnosis not present

## 2024-09-29 DIAGNOSIS — M25662 Stiffness of left knee, not elsewhere classified: Secondary | ICD-10-CM

## 2024-09-29 DIAGNOSIS — R2689 Other abnormalities of gait and mobility: Secondary | ICD-10-CM

## 2024-09-29 DIAGNOSIS — M25562 Pain in left knee: Secondary | ICD-10-CM

## 2024-10-10 ENCOUNTER — Other Ambulatory Visit (HOSPITAL_COMMUNITY)

## 2024-11-02 ENCOUNTER — Ambulatory Visit: Admitting: Orthopaedic Surgery

## 2024-11-07 ENCOUNTER — Other Ambulatory Visit (HOSPITAL_COMMUNITY)
